# Patient Record
Sex: Female | Born: 1960 | Race: Black or African American | Hispanic: No | Marital: Single | Smoking: Never smoker
Health system: Southern US, Community
[De-identification: ages and names within clinical notes are randomized; demographics above are authoritative.]

## PROBLEM LIST (undated history)

## (undated) DIAGNOSIS — E785 Hyperlipidemia, unspecified: Secondary | ICD-10-CM

## (undated) DIAGNOSIS — Z8679 Personal history of other diseases of the circulatory system: Secondary | ICD-10-CM

## (undated) DIAGNOSIS — H269 Unspecified cataract: Secondary | ICD-10-CM

## (undated) DIAGNOSIS — E7800 Pure hypercholesterolemia, unspecified: Secondary | ICD-10-CM

## (undated) DIAGNOSIS — M25559 Pain in unspecified hip: Secondary | ICD-10-CM

## (undated) DIAGNOSIS — Z9889 Other specified postprocedural states: Secondary | ICD-10-CM

## (undated) HISTORY — DX: Unspecified cataract: H26.9

## (undated) HISTORY — DX: Hyperlipidemia, unspecified: E78.5

## (undated) HISTORY — DX: Personal history of other diseases of the circulatory system: Z86.79

## (undated) HISTORY — DX: Other specified postprocedural states: Z98.890

## (undated) HISTORY — PX: CHOLECYSTECTOMY: SHX55

## (undated) HISTORY — DX: Pain in unspecified hip: M25.559

## (undated) HISTORY — DX: Pure hypercholesterolemia, unspecified: E78.00

---

## 1999-03-20 ENCOUNTER — Other Ambulatory Visit: Admission: RE | Admit: 1999-03-20 | Discharge: 1999-03-20 | Payer: Self-pay | Admitting: Internal Medicine

## 2000-02-01 ENCOUNTER — Other Ambulatory Visit: Admission: RE | Admit: 2000-02-01 | Discharge: 2000-02-01 | Payer: Self-pay | Admitting: Internal Medicine

## 2000-03-06 ENCOUNTER — Encounter: Admission: RE | Admit: 2000-03-06 | Discharge: 2000-06-04 | Payer: Self-pay | Admitting: Internal Medicine

## 2007-05-25 DIAGNOSIS — Z9889 Other specified postprocedural states: Secondary | ICD-10-CM

## 2007-05-25 HISTORY — DX: Other specified postprocedural states: Z98.890

## 2009-08-20 ENCOUNTER — Emergency Department (HOSPITAL_COMMUNITY): Admission: EM | Admit: 2009-08-20 | Discharge: 2009-08-21 | Payer: Self-pay | Admitting: Emergency Medicine

## 2011-03-31 LAB — GLUCOSE, CAPILLARY: Glucose-Capillary: 167 mg/dL — ABNORMAL HIGH (ref 70–99)

## 2011-04-30 ENCOUNTER — Inpatient Hospital Stay (INDEPENDENT_AMBULATORY_CARE_PROVIDER_SITE_OTHER)
Admission: RE | Admit: 2011-04-30 | Discharge: 2011-04-30 | Disposition: A | Discharge status: Home / Self Care | Payer: Medicare Other | Source: Ambulatory Visit | Attending: Family Medicine | Admitting: Family Medicine

## 2011-04-30 DIAGNOSIS — K219 Gastro-esophageal reflux disease without esophagitis: Secondary | ICD-10-CM

## 2011-06-28 ENCOUNTER — Encounter: Payer: Self-pay | Admitting: Cardiology

## 2014-01-06 ENCOUNTER — Emergency Department (INDEPENDENT_AMBULATORY_CARE_PROVIDER_SITE_OTHER)
Admission: EM | Admit: 2014-01-06 | Discharge: 2014-01-06 | Disposition: A | Discharge status: Hospice | Payer: Medicare Other | Source: Home / Self Care

## 2014-01-06 ENCOUNTER — Emergency Department (HOSPITAL_COMMUNITY)
Admission: EM | Admit: 2014-01-06 | Discharge: 2014-01-06 | Disposition: A | Discharge status: Home / Self Care | Payer: Medicare Other | Attending: Emergency Medicine | Admitting: Emergency Medicine

## 2014-01-06 ENCOUNTER — Encounter (HOSPITAL_COMMUNITY): Payer: Self-pay | Admitting: Emergency Medicine

## 2014-01-06 ENCOUNTER — Emergency Department (HOSPITAL_COMMUNITY): Payer: Medicare Other

## 2014-01-06 DIAGNOSIS — R079 Chest pain, unspecified: Secondary | ICD-10-CM

## 2014-01-06 DIAGNOSIS — R072 Precordial pain: Secondary | ICD-10-CM | POA: Insufficient documentation

## 2014-01-06 DIAGNOSIS — R0609 Other forms of dyspnea: Secondary | ICD-10-CM

## 2014-01-06 DIAGNOSIS — R0602 Shortness of breath: Secondary | ICD-10-CM | POA: Insufficient documentation

## 2014-01-06 DIAGNOSIS — E119 Type 2 diabetes mellitus without complications: Secondary | ICD-10-CM

## 2014-01-06 DIAGNOSIS — Z7982 Long term (current) use of aspirin: Secondary | ICD-10-CM | POA: Insufficient documentation

## 2014-01-06 DIAGNOSIS — R0989 Other specified symptoms and signs involving the circulatory and respiratory systems: Secondary | ICD-10-CM

## 2014-01-06 DIAGNOSIS — R42 Dizziness and giddiness: Secondary | ICD-10-CM | POA: Insufficient documentation

## 2014-01-06 DIAGNOSIS — R55 Syncope and collapse: Secondary | ICD-10-CM

## 2014-01-06 DIAGNOSIS — Z8679 Personal history of other diseases of the circulatory system: Secondary | ICD-10-CM | POA: Insufficient documentation

## 2014-01-06 DIAGNOSIS — Z9889 Other specified postprocedural states: Secondary | ICD-10-CM | POA: Insufficient documentation

## 2014-01-06 DIAGNOSIS — R002 Palpitations: Secondary | ICD-10-CM

## 2014-01-06 DIAGNOSIS — R0600 Dyspnea, unspecified: Secondary | ICD-10-CM

## 2014-01-06 DIAGNOSIS — I10 Essential (primary) hypertension: Secondary | ICD-10-CM

## 2014-01-06 LAB — CBC
HCT: 36.9 % (ref 36.0–46.0)
Hemoglobin: 13.1 g/dL (ref 12.0–15.0)
MCH: 29.8 pg (ref 26.0–34.0)
MCHC: 35.5 g/dL (ref 30.0–36.0)
MCV: 84.1 fL (ref 78.0–100.0)
PLATELETS: 249 10*3/uL (ref 150–400)
RBC: 4.39 MIL/uL (ref 3.87–5.11)
RDW: 13.2 % (ref 11.5–15.5)
WBC: 6.2 10*3/uL (ref 4.0–10.5)

## 2014-01-06 LAB — BASIC METABOLIC PANEL
BUN: 6 mg/dL (ref 6–23)
CALCIUM: 9.1 mg/dL (ref 8.4–10.5)
CO2: 26 meq/L (ref 19–32)
CREATININE: 0.71 mg/dL (ref 0.50–1.10)
Chloride: 101 mEq/L (ref 96–112)
GFR calc Af Amer: >90 mL/min (ref >90)
Glucose, Bld: 99 mg/dL (ref 70–99)
Potassium: 3.7 mEq/L (ref 3.7–5.3)
Sodium: 139 mEq/L (ref 137–147)

## 2014-01-06 LAB — TROPONIN I

## 2014-01-06 LAB — GLUCOSE, CAPILLARY: Glucose-Capillary: 90 mg/dL (ref 70–99)

## 2014-01-06 NOTE — Discharge Instructions (Signed)
Chest Pain (Nonspecific) °It is often hard to give a specific diagnosis for the cause of chest pain. There is always a chance that your pain could be related to something serious, such as a heart attack or a blood clot in the lungs. You need to follow up with your caregiver for further evaluation. °CAUSES  °· Heartburn. °· Pneumonia or bronchitis. °· Anxiety or stress. °· Inflammation around your heart (pericarditis) or lung (pleuritis or pleurisy). °· A blood clot in the lung. °· A collapsed lung (pneumothorax). It can develop suddenly on its own (spontaneous pneumothorax) or from injury (trauma) to the chest. °· Shingles infection (herpes zoster virus). °The chest wall is composed of bones, muscles, and cartilage. Any of these can be the source of the pain. °· The bones can be bruised by injury. °· The muscles or cartilage can be strained by coughing or overwork. °· The cartilage can be affected by inflammation and become sore (costochondritis). °DIAGNOSIS  °Lab tests or other studies, such as X-rays, electrocardiography, stress testing, or cardiac imaging, may be needed to find the cause of your pain.  °TREATMENT  °· Treatment depends on what may be causing your chest pain. Treatment may include: °· Acid blockers for heartburn. °· Anti-inflammatory medicine. °· Pain medicine for inflammatory conditions. °· Antibiotics if an infection is present. °· You may be advised to change lifestyle habits. This includes stopping smoking and avoiding alcohol, caffeine, and chocolate. °· You may be advised to keep your head raised (elevated) when sleeping. This reduces the chance of acid going backward from your stomach into your esophagus. °· Most of the time, nonspecific chest pain will improve within 2 to 3 days with rest and mild pain medicine. °HOME CARE INSTRUCTIONS  °· If antibiotics were prescribed, take your antibiotics as directed. Finish them even if you start to feel better. °· For the next few days, avoid physical  activities that bring on chest pain. Continue physical activities as directed. °· Do not smoke. °· Avoid drinking alcohol. °· Only take over-the-counter or prescription medicine for pain, discomfort, or fever as directed by your caregiver. °· Follow your caregiver's suggestions for further testing if your chest pain does not go away. °· Keep any follow-up appointments you made. If you do not go to an appointment, you could develop lasting (chronic) problems with pain. If there is any problem keeping an appointment, you must call to reschedule. °SEEK MEDICAL CARE IF:  °· You think you are having problems from the medicine you are taking. Read your medicine instructions carefully. °· Your chest pain does not go away, even after treatment. °· You develop a rash with blisters on your chest. °SEEK IMMEDIATE MEDICAL CARE IF:  °· You have increased chest pain or pain that spreads to your arm, neck, jaw, back, or abdomen. °· You develop shortness of breath, an increasing cough, or you are coughing up blood. °· You have severe back or abdominal pain, feel nauseous, or vomit. °· You develop severe weakness, fainting, or chills. °· You have a fever. °THIS IS AN EMERGENCY. Do not wait to see if the pain will go away. Get medical help at once. Call your local emergency services (911 in [place] Do not drive yourself to the hospital. °MAKE SURE YOU:  °· Understand these instructions. °· Will watch your condition. °· Will get help right away if you are not doing well or get worse. °Document Released: 09/19/2005 Document Revised: 03/03/2012 Document Reviewed: 07/15/2008 °Exit[employer]® Patient Information ©2014 [employer]   [employer]. ° °

## 2014-01-06 NOTE — ED Notes (Signed)
Per pt sts chest pain with movement that started yesterday. sts the pain is intermittent and more with movement. sts that 2 weeks ago she had a virus and some vomiting but that is better now. Denies cough, fever, SOB.

## 2014-01-06 NOTE — ED Provider Notes (Signed)
CSN: [id number]     Arrival date & time 01/06/14  1045 History   First MD Initiated Contact with Patient 01/06/14 1121     Chief Complaint  Patient presents with  . Abdominal Pain   (Consider location/radiation/quality/duration/timing/severity/associated sxs/prior Treatment) HPI Comments: 53 year old female has a history of hypertension and type 2 diabetes mellitus but has not seen a physician or taken any medications for over 10 years. Yesterday she was in an exercise class which utilizes weights and treadmills and during the exercise she developed increased shortness of breath and a feeling that she was going to faint. This was associated with palpitations and rapid heart rate. This had never happened to her before while she been exercising. She had to stop and rest however the sensation of rapid heartbeat persisted through the remainder of the day and earlier today. She continues to rub her hand across her anterior chest and below the breast and across the upper abdomen as the source of discomfort but she is unable to describe it. She has no known history of heart disease or lung disease. It is written on her PMH that her history includes "personal history of unspecified circulatory disease". She underwent a cardiac catheterization in June of 2008: There is old the comments section reads "abnormal Cardiolite. LVH...questionable prior studies and question septal hypertrophy...had not seen echocardiogram. He had 65%. She is stable nail she does complain of mildly increased heart rate however her apical rate is 80.   Past Medical History  Diagnosis Date  . Other and unspecified hyperlipidemia   . Hip pain   . Personal history of unspecified circulatory disease   . Diabetes mellitus   . High cholesterol   . S/P cardiac cath June 2008    abnormal cardiolite. LVH..question on prior studies and question septal hypertroph...not seen echo... EF 65%   History reviewed. No pertinent past surgical  history. Family History  Problem Relation Age of Onset  . Stroke Other   . Diabetes Other   . Arthritis Other    History  Substance Use Topics  . Smoking status: Never Smoker   . Smokeless tobacco: Not on file  . Alcohol Use: No   OB History   Grav Para Term Preterm Abortions TAB SAB Ect Mult Living                 Review of Systems  Constitutional: Positive for diaphoresis, activity change and fatigue. Negative for fever.  HENT: Negative.   Respiratory: Positive for shortness of breath. Negative for cough and chest tightness.   Cardiovascular: Positive for palpitations.  Gastrointestinal: Negative.   Genitourinary: Negative.   Musculoskeletal: Negative.   Neurological: Positive for dizziness and light-headedness.    Allergies  Review of patient's allergies indicates no known allergies.  Home Medications   Current Outpatient Rx  Name  Route  Sig  Dispense  Refill  . aspirin 325 MG tablet   Oral   Take 325 mg by mouth daily.           . Calcium Carbonate-Vitamin D (CALCIUM-CARB 600 + D) 600-125 MG-UNIT TABS   Oral   Take by mouth daily.           . Cholecalciferol (D 5000) 5000 UNITS TABS   Oral   Take by mouth daily.           . Cinnamon 500 MG capsule   Oral   Take 500 mg by mouth daily. Take 2 caps          .  glipiZIDE (GLUCOTROL) 5 MG tablet   Oral   Take 5 mg by mouth 2 (two) times daily.           Marland Kitchen. lisinopril (PRINIVIL,ZESTRIL) 20 MG tablet   Oral   Take 20 mg by mouth daily.           . metFORMIN (GLUCOPHAGE) 500 MG tablet   Oral   Take 500 mg by mouth 2 (two) times daily.           . Omega-3 Fatty Acids (FISH OIL) 1000 MG CAPS   Oral   Take by mouth 2 (two) times daily.           . simvastatin (ZOCOR) 20 MG tablet   Oral   Take 20 mg by mouth at bedtime.           . vitamin C (ASCORBIC ACID) 500 MG tablet   Oral   Take 500 mg by mouth daily.            BP 139/74  Pulse 86  Temp(Src) 97.9 F (36.6 C) (Oral)   Resp 18  SpO2 100% Physical Exam  Nursing note and vitals reviewed. Constitutional: She is oriented to person, place, and time. She appears well-developed and well-nourished. No distress.  HENT:  Mouth/Throat: Oropharynx is clear and moist. No oropharyngeal exudate.  Eyes: Conjunctivae and EOM are normal.  Neck: Normal range of motion. Neck supple. No JVD present.  Cardiovascular: Normal rate, regular rhythm and normal heart sounds.   Pulmonary/Chest: Effort normal and breath sounds normal. No respiratory distress. She has no wheezes. She has no rales.  Abdominal: Soft. She exhibits no distension. There is no tenderness. There is no rebound.  Musculoskeletal: She exhibits no edema and no tenderness.  Lymphadenopathy:    She has no cervical adenopathy.  Neurological: She is alert and oriented to person, place, and time. She exhibits normal muscle tone.  Skin: Skin is warm and dry. No rash noted. No erythema.  Psychiatric: She has a normal mood and affect.    ED Course  Procedures (including critical care time) Labs Review Labs Reviewed - No data to display Imaging Review No results found.  EKG: ST depression with T-wave inversion in lead 3, less than 1/2 mm ST depression in aVF, inverted T waves in V2 with 1/2 mm ST elevation in V2. Normal sinus rhythm, no ectopy.   MDM   1. Near syncope   2. Heart palpitations   3. Dyspnea   4. HTN (hypertension)   5. T2DM (type 2 diabetes mellitus)      Due to the patient's history of untreated and uncontrolled hypertension and type 2 diabetes mellitus for the past 10 years, no local PCP and the events that occurred yesterday we will transfer to the emergency department via shuttle. She is stable at this time. She has no chest pain she is not short of breath. Her vitals are within normal limits.    Hayden Rasmussenavid Brenlynn Fake, NP 01/06/14 1227

## 2014-01-06 NOTE — ED Provider Notes (Signed)
Medical screening examination/treatment/procedure(s) were performed by non-physician practitioner and as supervising physician I was immediately available for consultation/collaboration.  Leslee Homeavid Cassia Fein, M.D.   Reuben Likesavid C Icela Glymph, MD 01/06/14 2053

## 2014-01-06 NOTE — ED Provider Notes (Signed)
I saw and evaluated the patient, reviewed the resident's note and I agree with the findings and plan. Patient is a 53 year old female who presents with complaints of chest discomfort that started the day before while in an exercise class. She denies shortness of breath, nausea, or diaphoresis. She has no prior cardiac history and has no risk factors.  On exam she is afebrile and vitals are stable. Heart is regular rate and rhythm and lungs are clear. Abdomen is benign. Extremities are without edema.   Workup reveals and negative EKG and negative troponin with almost 24 hours worth of symptoms. I doubt a cardiac etiology however we have made arrangements for her to followup with cardiology in the next one to 2 days. She is to return to the ER for symptoms worsen or change.  EKG Interpretation    Date/Time:  Wednesday January 06 2014 16:24:18 EST Ventricular Rate:  85 PR Interval:  185 QRS Duration: 104 QT Interval:  432 QTC Calculation: 514 R Axis:   30 Text Interpretation:  Sinus rhythm Probable left atrial enlargement Low voltage, precordial leads ST elevation, consider inferior injury Prolonged QT interval Confirmed by Malva CoganELOS  MD, Jonhatan Hearty ([id number]) on 01/06/2014 4:47:51 PM               Geoffery Lyonsouglas Moustapha Tooker, MD 01/06/14 2330

## 2014-01-06 NOTE — ED Provider Notes (Signed)
CSN: [id number]     Arrival date & time 01/06/14  1305 History   First MD Initiated Contact with Patient 01/06/14 1606     Chief Complaint  Patient presents with  . Chest Pain   (Consider location/radiation/quality/duration/timing/severity/associated sxs/prior Treatment) Patient is a 53 y.o. female presenting with chest pain. The history is provided by the patient and a relative. The history is limited by a developmental delay.  Chest Pain Pain location:  Substernal area Pain quality comment:  Shortness of breath, palpitations Pain radiates to:  Epigastrium Pain severity:  Mild Onset quality:  Sudden Progression:  Resolved Chronicity:  New Context comment:  Exercising Relieved by:  Rest Associated symptoms: dizziness   Associated symptoms: no abdominal pain, no back pain, no cough, no diaphoresis, no fever, no headache, no nausea, no numbness, no shortness of breath and not vomiting     Past Medical History  Diagnosis Date  . Other and unspecified hyperlipidemia   . Hip pain   . Personal history of unspecified circulatory disease   . Diabetes mellitus   . High cholesterol   . S/P cardiac cath June 2008    abnormal cardiolite. LVH..question on prior studies and question septal hypertroph...not seen echo... EF 65%   History reviewed. No pertinent past surgical history. Family History  Problem Relation Age of Onset  . Stroke Other   . Diabetes Other   . Arthritis Other    History  Substance Use Topics  . Smoking status: Never Smoker   . Smokeless tobacco: Not on file  . Alcohol Use: No   OB History   Grav Para Term Preterm Abortions TAB SAB Ect Mult Living                 Review of Systems  Constitutional: Negative for fever, chills and diaphoresis.  HENT: Negative for sore throat.   Eyes: Negative for pain.  Respiratory: Negative for cough and shortness of breath.   Cardiovascular: Positive for chest pain.  Gastrointestinal: Negative for nausea, vomiting,  abdominal pain and diarrhea.  Genitourinary: Negative for dysuria.  Musculoskeletal: Negative for back pain.  Skin: Negative for rash.  Neurological: Positive for dizziness. Negative for syncope (felt like she was going to pass out yesterday), numbness and headaches.    Allergies  Review of patient's allergies indicates no known allergies.  Home Medications   Current Outpatient Rx  Name  Route  Sig  Dispense  Refill  . aspirin 325 MG tablet   Oral   Take 325 mg by mouth daily.           . Multiple Vitamin (MULTIVITAMIN WITH MINERALS) TABS tablet   Oral   Take 1 tablet by mouth daily.         . Omega-3 Fatty Acids (FISH OIL) 1000 MG CAPS   Oral   Take 1 capsule by mouth every morning.          Marland Kitchen. tetrahydrozoline 0.05 % ophthalmic solution   Both Eyes   Place 2 drops into both eyes daily as needed (for dry eyes).          BP 122/84  Pulse 83  Temp(Src) 97.8 F (36.6 C) (Oral)  Resp 20  Ht 5\' 1"  (1.549 m)  Wt 168 lb (76.204 kg)  BMI 31.76 kg/m2  SpO2 98%  LMP 11/28/2013 Physical Exam  Constitutional: She is oriented to person, place, and time. She appears well-developed and well-nourished. No distress.  HENT:  Head: Normocephalic and atraumatic.  Eyes: Pupils are equal, round, and reactive to light. Right eye exhibits no discharge. Left eye exhibits no discharge.  Neck: Normal range of motion.  Cardiovascular: Normal rate, regular rhythm and normal heart sounds.   Pulmonary/Chest: Effort normal and breath sounds normal.  Abdominal: Soft. She exhibits no distension. There is no tenderness.  Musculoskeletal: Normal range of motion.  Neurological: She is alert and oriented to person, place, and time.  Skin: Skin is warm. She is not diaphoretic.    ED Course  Procedures (including critical care time) Labs Review Labs Reviewed  CBC  BASIC METABOLIC PANEL  TROPONIN I   Imaging Review Dg Chest 2 View  01/06/2014   CLINICAL DATA:  Centralized chest upper  abdominal pain for 2 days, history diabetes  EXAM: CHEST  2 VIEW  COMPARISON:  None  FINDINGS: Upper normal heart size.  Normal mediastinal contours and pulmonary vascularity.  Lungs clear.  No pleural effusion or pneumothorax.  Surgical clips right upper quadrant most consistent with prior cholecystectomy.  No acute osseous findings.  IMPRESSION: No acute abnormalities.   Electronically Signed   By: Ulyses Southward M.D.   On: 01/06/2014 14:27    EKG Interpretation    Date/Time:  Wednesday January 06 2014 16:24:18 EST Ventricular Rate:  85 PR Interval:  185 QRS Duration: 104 QT Interval:  432 QTC Calculation: 514 R Axis:   30 Text Interpretation:  Sinus rhythm Probable left atrial enlargement Low voltage, precordial leads ST elevation, consider inferior injury Prolonged QT interval Confirmed by Malva Cogan  MD, DOUGLAS ([id number]) on 01/06/2014 4:47:51 PM            MDM   1. Chest pain    53 yo F with hx of DM, HLD who presents with episode of shortness of breath, chest pain, faint feeling yesterday.   No current chest pain upon arrival. Patient and sister concerned for episode yesterday, which sounds like it was caused by exercise - diaphoresis, chest racing while doing treadmills/weights. Patient with risks for ACS, but no current chest pain. Patient with basic lab workup, with no acute concerning abnormalities.   Discussed with patient follow-up with cardiology / PCP. Patient and sister voice understanding. Patient has not followed-up with cardiology in the past after similar complaints. Called cardiology for unassigned patient, who agree to flag the patient for follow-up and contact her as an outpatient. Patient stable for discharge. Strict return precautions given. Discharged to home in stable condition. Patient seen and evaluated by myself and my attending, Dr. Judd Lien.      Imagene Sheller, MD 01/06/14 [phone]

## 2014-01-06 NOTE — ED Notes (Signed)
C/o upper and lower abd pain States she has pain underneath her breast and lower abd pain Does admit to working out  Admits to being a diabetic Has not had medications in a long time.

## 2014-01-20 ENCOUNTER — Ambulatory Visit: Payer: Medicare Other | Attending: Internal Medicine | Admitting: Internal Medicine

## 2014-01-20 ENCOUNTER — Encounter: Payer: Self-pay | Admitting: Internal Medicine

## 2014-01-20 VITALS — BP 152/91 | HR 81 | Temp 97.1°F | Resp 16 | Ht 61.0 in | Wt 168.0 lb

## 2014-01-20 DIAGNOSIS — E119 Type 2 diabetes mellitus without complications: Secondary | ICD-10-CM

## 2014-01-20 DIAGNOSIS — N644 Mastodynia: Secondary | ICD-10-CM

## 2014-01-20 DIAGNOSIS — R079 Chest pain, unspecified: Secondary | ICD-10-CM | POA: Insufficient documentation

## 2014-01-20 LAB — COMPLETE METABOLIC PANEL WITH GFR
ALBUMIN: 4.1 g/dL (ref 3.5–5.2)
ALT: 16 U/L (ref 0–35)
AST: 16 U/L (ref 0–37)
Alkaline Phosphatase: 49 U/L (ref 39–117)
BILIRUBIN TOTAL: 1.2 mg/dL (ref 0.2–1.2)
BUN: 6 mg/dL (ref 6–23)
CO2: 27 meq/L (ref 19–32)
Calcium: 8.9 mg/dL (ref 8.4–10.5)
Chloride: 102 mEq/L (ref 96–112)
Creat: 0.7 mg/dL (ref 0.50–1.10)
GLUCOSE: 112 mg/dL — AB (ref 70–99)
POTASSIUM: 3.6 meq/L (ref 3.5–5.3)
SODIUM: 135 meq/L (ref 135–145)
TOTAL PROTEIN: 6.8 g/dL (ref 6.0–8.3)

## 2014-01-20 LAB — GLUCOSE, POCT (MANUAL RESULT ENTRY): POC Glucose: 109 mg/dl — AB (ref 70–99)

## 2014-01-20 LAB — POCT GLYCOSYLATED HEMOGLOBIN (HGB A1C): Hemoglobin A1C: 6.7

## 2014-01-20 MED ORDER — PANTOPRAZOLE SODIUM 40 MG PO TBEC: 40.0000 mg | DELAYED_RELEASE_TABLET | Freq: Every day | ORAL | Status: DC

## 2014-01-20 MED ORDER — METFORMIN HCL 500 MG PO TABS: 500.0000 mg | ORAL_TABLET | Freq: Two times a day (BID) | ORAL | Status: DC

## 2014-01-20 NOTE — Progress Notes (Signed)
Patient ID: [name] Barker, female   DOB: [birth year], 53 y.o.   MRN: [id number]   CC:  HPI: 53 year old female with a history of hypertension, diabetes, recently seen in the ED on 01/06/14 for chest pain. The patient apparently was diaphoretic and tachycardic during exercise on the treadmill. She was symptom free by the time she reached the ED. She exercises 3 times a week. She does not report any consistent symptoms of chest pain with exercise. She claimed that she had a stress test in 2012 done by Dr. Myrtis Ser, she does not the results. She occasionally has heartburn during her menstrual cycle  She still gets her menstrual cycle monthly. She cannot recall when she had her last Pap smear or mammogram. She does not recall ever having a colonoscopy  Family history positive for colon cancer in the mother Father had heart attack at the age of 9  Social history nonsmoker nonalcoholic, retired since 2002  No Known Allergies Past Medical History  Diagnosis Date  . Other and unspecified hyperlipidemia   . Hip pain   . Personal history of unspecified circulatory disease   . Diabetes mellitus   . High cholesterol   . S/P cardiac cath June 2008    abnormal cardiolite. LVH..question on prior studies and question septal hypertroph...not seen echo... EF 65%   Current Outpatient Prescriptions on File Prior to Visit  Medication Sig Dispense Refill  . aspirin 325 MG tablet Take 325 mg by mouth daily.        . Multiple Vitamin (MULTIVITAMIN WITH MINERALS) TABS tablet Take 1 tablet by mouth daily.      . Omega-3 Fatty Acids (FISH OIL) 1000 MG CAPS Take 1 capsule by mouth every morning.       Marland Kitchen tetrahydrozoline 0.05 % ophthalmic solution Place 2 drops into both eyes daily as needed (for dry eyes).       No current facility-administered medications on file prior to visit.   Family History  Problem Relation Age of Onset  . Stroke Other   . Diabetes Other   . Arthritis Other    History   Social  History  . Marital Status: Single    Spouse Name: N/A    Number of Children: N/A  . Years of Education: N/A   Occupational History  . Not on file.   Social History Main Topics  . Smoking status: Never Smoker   . Smokeless tobacco: Not on file  . Alcohol Use: No  . Drug Use: No  . Sexual Activity:    Other Topics Concern  . 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     Review of Systems  Constitutional: Negative for fever, chills, diaphoresis, activity change, appetite change and fatigue.  HENT: Negative for ear pain, nosebleeds, congestion, facial swelling, rhinorrhea, neck pain, neck stiffness and ear discharge.   Eyes: Negative for pain, discharge, redness, itching and visual disturbance.  Respiratory: Negative for cough, choking, chest tightness, shortness of breath, wheezing and stridor.   Cardiovascular: Negative for chest pain, palpitations and leg swelling.  Gastrointestinal: Negative for abdominal distention.  Genitourinary: Negative for dysuria, urgency, frequency, hematuria, flank pain, decreased urine volume, difficulty urinating and dyspareunia.  Musculoskeletal: Negative for back pain, joint swelling, arthralgias and gait problem.  Neurological: Negative for dizziness, tremors, seizures, syncope, facial asymmetry, speech difficulty, weakness, light-headedness, numbness and headaches.  Hematological: Negative for adenopathy. Does not bruise/bleed easily.  Psychiatric/Behavioral: Negative for hallucinations, behavioral problems, confusion, dysphoric mood, decreased concentration and agitation.    Objective:   Filed Vitals:   01/20/14 1410  BP: 152/91  Pulse: 81  Temp: 97.1 F (36.2 C)  Resp: 16    Physical Exam  Constitutional: Appears well-developed and well-nourished. No distress.  HENT: Normocephalic. External right and left ear normal. Oropharynx is clear and moist.  Eyes: Conjunctivae and EOM  are normal. PERRLA, no scleral icterus.  Neck: Normal ROM. Neck supple. No JVD. No tracheal deviation. No thyromegaly.  CVS: RRR, S1/S2 +, no murmurs, no gallops, no carotid bruit.  Pulmonary: Effort and breath sounds normal, no stridor, rhonchi, wheezes, rales.  Abdominal: Soft. BS +,  no distension, tenderness, rebound or guarding.  Musculoskeletal: Normal range of motion. No edema and no tenderness.  Lymphadenopathy: No lymphadenopathy noted, cervical, inguinal. Neuro: Alert. Normal reflexes, muscle tone coordination. No cranial nerve deficit. Skin: Skin is warm and dry. No rash noted. Not diaphoretic. No erythema. No pallor.  Psychiatric: Normal mood and affect. Behavior, judgment, thought content normal.   Lab Results  Component Value Date   WBC 6.2 01/06/2014   HGB 13.1 01/06/2014   HCT 36.9 01/06/2014   MCV 84.1 01/06/2014   PLT 249 01/06/2014   Lab Results  Component Value Date   CREATININE 0.71 01/06/2014   BUN 6 01/06/2014   NA 139 01/06/2014   K 3.7 01/06/2014   CL 101 01/06/2014   CO2 26 01/06/2014    No results found for this basename: HGBA1C   Lipid Panel  No results found for this basename: chol, trig, hdl, cholhdl, vldl, ldlcalc       Assessment and plan:   There are no active problems to display for this patient.  Chest pain  Isolated episode, probably musculoskeletal Patient does not report any ongoing symptoms and she exercises 3 times a week We'll obtain a 2-D echo Troponin Also start the patient on a PPI If the echo negative no further workup is indicated   Establish care Refer the patient to gynecology for a Pap smear Gastroenterology for a colonoscopy Vitamin D., TSH, liver function Agreeable to receive immunization for tetanus and hepatitis B today       The patient was given clear instructions to go to ER or return to medical center if symptoms don't improve, worsen or new problems develop. The patient verbalized understanding. The patient was  told to call to get any lab results if not heard anything in the next week.

## 2014-01-20 NOTE — Progress Notes (Signed)
Pt is here to establish care. Pt is here to manage her diabetes and HTN.

## 2014-01-21 ENCOUNTER — Ambulatory Visit: Payer: Medicare Other

## 2014-01-21 LAB — TROPONIN I: Troponin I: 0.01 ng/mL (ref <0.06)

## 2014-01-21 LAB — TSH: TSH: 0.797 u[IU]/mL (ref 0.350–4.500)

## 2014-01-22 ENCOUNTER — Telehealth: Payer: Self-pay | Admitting: *Deleted

## 2014-01-22 NOTE — Telephone Encounter (Signed)
Message copied by Yuliana Vandrunen, [place] on Fri Jan 22, 2014 12:09 PM ------      Message from: Susie CassetteABROL MD, [facility]      Created: Thu Jan 21, 2014  9:20 AM       Notify patient of labs are normal, A1c is elevated at 6.7, patient is in the diabetic range, metformin has been prescribed ------

## 2014-01-22 NOTE — Telephone Encounter (Signed)
Left a voicemail for pt to give us a call back. 

## 2014-01-28 ENCOUNTER — Ambulatory Visit (HOSPITAL_COMMUNITY)
Admission: RE | Admit: 2014-01-28 | Discharge: 2014-01-28 | Disposition: A | Discharge status: Home / Self Care | Payer: Medicare Other | Source: Ambulatory Visit | Attending: Internal Medicine | Admitting: Internal Medicine

## 2014-01-28 DIAGNOSIS — I10 Essential (primary) hypertension: Secondary | ICD-10-CM | POA: Insufficient documentation

## 2014-01-28 DIAGNOSIS — I369 Nonrheumatic tricuspid valve disorder, unspecified: Secondary | ICD-10-CM

## 2014-01-28 DIAGNOSIS — R079 Chest pain, unspecified: Secondary | ICD-10-CM | POA: Insufficient documentation

## 2014-01-28 DIAGNOSIS — E119 Type 2 diabetes mellitus without complications: Secondary | ICD-10-CM

## 2014-01-28 DIAGNOSIS — I079 Rheumatic tricuspid valve disease, unspecified: Secondary | ICD-10-CM | POA: Insufficient documentation

## 2014-01-28 NOTE — Progress Notes (Signed)
  Echocardiogram 2D Echocardiogram has been performed.  Melvia Matousek 01/28/2014, 11:50 AM

## 2014-01-29 ENCOUNTER — Encounter: Payer: Self-pay | Admitting: Gastroenterology

## 2014-02-01 ENCOUNTER — Telehealth: Payer: Self-pay | Admitting: *Deleted

## 2014-02-01 NOTE — Telephone Encounter (Signed)
Message copied by Azarias Chiou, [place] R on Mon Feb 01, 2014  2:03 PM ------      Message from: Susie CassetteABROL MD, [facility]      Created: Fri Jan 29, 2014  9:52 AM       Normal 2-D echo ------

## 2014-02-04 ENCOUNTER — Ambulatory Visit
Admission: RE | Admit: 2014-02-04 | Discharge: 2014-02-04 | Disposition: A | Discharge status: Home / Self Care | Payer: Self-pay | Source: Ambulatory Visit | Attending: Internal Medicine | Admitting: Internal Medicine

## 2014-02-04 DIAGNOSIS — N644 Mastodynia: Secondary | ICD-10-CM

## 2014-02-04 DIAGNOSIS — E119 Type 2 diabetes mellitus without complications: Secondary | ICD-10-CM

## 2014-02-08 ENCOUNTER — Telehealth: Payer: Self-pay | Admitting: *Deleted

## 2014-02-08 NOTE — Telephone Encounter (Signed)
Message copied by Kaylean Tupou, [place] R on Mon Feb 08, 2014 11:32 AM ------      Message from: Susie CassetteABROL MD, Germain OsgoodNAYANA      Created: Fri Feb 05, 2014  2:08 PM       Negative mammogram, notify patient ------

## 2014-02-08 NOTE — Telephone Encounter (Signed)
Notified patient of results as followed. 

## 2014-03-09 ENCOUNTER — Ambulatory Visit (AMBULATORY_SURGERY_CENTER): Payer: Self-pay

## 2014-03-09 VITALS — Ht 61.0 in | Wt 167.2 lb

## 2014-03-09 DIAGNOSIS — Z1211 Encounter for screening for malignant neoplasm of colon: Secondary | ICD-10-CM

## 2014-03-09 MED ORDER — METOCLOPRAMIDE HCL 10 MG PO TABS: 10.0000 mg | ORAL_TABLET | Freq: Two times a day (BID) | ORAL | Status: DC

## 2014-03-23 ENCOUNTER — Encounter: Payer: Self-pay | Admitting: Gastroenterology

## 2014-03-23 ENCOUNTER — Ambulatory Visit (AMBULATORY_SURGERY_CENTER): Payer: Medicare Other | Admitting: Gastroenterology

## 2014-03-23 VITALS — BP 143/91 | HR 85 | Temp 98.0°F | Resp 22 | Ht 61.0 in | Wt 167.0 lb

## 2014-03-23 DIAGNOSIS — Z1211 Encounter for screening for malignant neoplasm of colon: Secondary | ICD-10-CM

## 2014-03-23 LAB — GLUCOSE, CAPILLARY
Glucose-Capillary: 76 mg/dL (ref 70–99)
Glucose-Capillary: 87 mg/dL (ref 70–99)
Glucose-Capillary: 105 mg/dL — ABNORMAL HIGH (ref 70–99)

## 2014-03-23 NOTE — Progress Notes (Addendum)
Spoke with Dr. Arlyce DiceKaplan regarding patient's 75 CBG.  She is not symptomatic.  Patient states that she doesn't currently have a doctor.  She isn't on meds due to the lack of insurance.   She said that it's been "over 4 years" since she had seen a doctor.  She states that she is "working on getting a new one."  Dr. Arlyce DiceKaplan said to give her D5W at a KVO rate for stabilization.  V/O Dr. Lewie Loronobert Kaplan/Suzanne Hodges, RN

## 2014-03-23 NOTE — Progress Notes (Signed)
Report to pacu rn, vss, bbs=clear 

## 2014-03-23 NOTE — Patient Instructions (Addendum)
YOU HAD AN ENDOSCOPIC PROCEDURE TODAY AT THE [facility]: Refer to the procedure report that was given to you for any specific questions about what was found during the examination.  If the procedure report does not answer your questions, please call your gastroenterologist to clarify.  If you requested that your care partner not be given the details of your procedure findings, then the procedure report has been included in a sealed envelope for you to review at your convenience later.  YOU SHOULD EXPECT: Some feelings of bloating in the abdomen. Passage of more gas than usual.  Walking can help get rid of the air that was put into your GI tract during the procedure and reduce the bloating. If you had a lower endoscopy (such as a colonoscopy or flexible sigmoidoscopy) you may notice spotting of blood in your stool or on the toilet paper. If you underwent a bowel prep for your procedure, then you may not have a normal bowel movement for a few days.  DIET: Your first meal following the procedure should be a light meal and then it is ok to progress to your normal diet.  A half-sandwich or bowl of soup is an example of a good first meal.  Heavy or fried foods are harder to digest and may make you feel nauseous or bloated.  Likewise meals heavy in dairy and vegetables can cause extra gas to form and this can also increase the bloating.  Drink plenty of fluids but you should avoid alcoholic beverages for 24 hours.  ACTIVITY: Your care partner should take you home directly after the procedure.  You should plan to take it easy, moving slowly for the rest of the day.  You can resume normal activity the day after the procedure however you should NOT DRIVE or use heavy machinery for 24 hours (because of the sedation medicines used during the test).    SYMPTOMS TO REPORT IMMEDIATELY: A gastroenterologist can be reached at any hour.  During normal business hours, 8:30 AM to 5:00 PM Monday through Friday,  call [phone].  After hours and on weekends, please call the GI answering service at [phone] who will take a message and have the physician on call contact you.   Following lower endoscopy (colonoscopy or flexible sigmoidoscopy):  Excessive amounts of blood in the stool  Significant tenderness or worsening of abdominal pains  Swelling of the abdomen that is new, acute  Fever of 100F or higher   FOLLOW UP: If any biopsies were taken you will be contacted by phone or by letter within the next 1-3 weeks.  Call your gastroenterologist if you have not heard about the biopsies in 3 weeks.  Our staff will call the home number listed on your records the next business day following your procedure to check on you and address any questions or concerns that you may have at that time regarding the information given to you following your procedure. This is a courtesy call and so if there is no answer at the home number and we have not heard from you through the emergency physician on call, we will assume that you have returned to your regular daily activities without incident.  SIGNATURES/CONFIDENTIALITY: You and/or your care partner have signed paperwork which will be entered into your electronic medical record.  These signatures attest to the fact that that the information above on your After Visit Summary has been reviewed and is understood.  Full responsibility of the confidentiality of   this discharge information lies with you and/or your care-partner.   You colon was normal today.  Dr. Arlyce DiceKaplan recommends to repeat next colonoscopy in 10 years. You may resume your current medications today. Please call if any questions or concerns. Blood sugar was 105 in the recovery room.

## 2014-03-23 NOTE — Progress Notes (Signed)
No complaints noted in the recovery room. Maw   

## 2014-03-23 NOTE — Op Note (Signed)
[facility] [address] [place], [id number]   COLONOSCOPY PROCEDURE REPORT  PATIENT: Barker Barker  MR#: [id number] BIRTHDATE: Oct 18, 1961 , 52  yrs. old GENDER: Female ENDOSCOPIST: Louis Meckelobert D Kaplan, MD REFERRED BY: PROCEDURE DATE:  03/23/2014 PROCEDURE:   Colonoscopy, diagnostic First Screening Colonoscopy - Avg.  risk and is 50 yrs.  old or older Yes.  Prior Negative Screening - Now for repeat screening. N/A  History of Adenoma - Now for follow-up colonoscopy & has been > or = to 3 yrs.  N/A  Polyps Removed Today? No.  Recommend repeat exam, <10 yrs? No. ASA CLASS:   Class II INDICATIONS:average risk screening. MEDICATIONS: MAC sedation, administered by CRNA and Propofol (Diprivan) 230 mg IV  DESCRIPTION OF PROCEDURE:   After the risks benefits and alternatives of the procedure were thoroughly explained, informed consent was obtained.  A digital rectal exam revealed no abnormalities of the rectum.   The LB [id number]  endoscope was introduced through the anus and advanced to the cecum, which was identified by both the appendix and ileocecal valve. No adverse events experienced.   The quality of the prep was Suprep fair  The instrument was then slowly withdrawn as the colon was fully examined.      COLON FINDINGS: A normal appearing cecum, ileocecal valve, and appendiceal orifice were identified.  The ascending, hepatic flexure, transverse, splenic flexure, descending, sigmoid colon and rectum appeared unremarkable.  No polyps or cancers were seen. Retroflexed views revealed no abnormalities. The time to cecum=2 minutes 02 seconds.  Withdrawal time=9 minutes 51 seconds.  The scope was withdrawn and the procedure completed. COMPLICATIONS: There were no complications.  ENDOSCOPIC IMPRESSION: Normal colon  RECOMMENDATIONS: Continue current colorectal screening recommendations for "routine risk" patients with a repeat colonoscopy in 10  years.   eSigned:  Louis Meckelobert D Kaplan, MD 03/23/2014 12:20 PM   cc: [name] LewandowskyJegede, [place] MD

## 2014-03-24 ENCOUNTER — Telehealth: Payer: Self-pay | Admitting: *Deleted

## 2014-03-24 NOTE — Telephone Encounter (Signed)
  Follow up Call-  Call back number 03/23/2014  Post procedure Call Back phone  # [phone]  Permission to leave phone message Yes     Patient questions:  Do you have a fever, pain , or abdominal swelling? no Pain Score  0 *  Have you tolerated food without any problems? yes  Have you been able to return to your normal activities? yes  Do you have any questions about your discharge instructions: Diet   no Medications  no Follow up visit  no  Do you have questions or concerns about your Care? no  Actions: * If pain score is 4 or above: No action needed, pain <4.

## 2014-04-20 ENCOUNTER — Ambulatory Visit: Payer: Medicare Other

## 2014-04-22 ENCOUNTER — Ambulatory Visit: Payer: Medicare Other | Attending: Family Medicine | Admitting: Family Medicine

## 2014-04-22 ENCOUNTER — Encounter: Payer: Self-pay | Admitting: Family Medicine

## 2014-04-22 VITALS — BP 142/88 | HR 82 | Temp 98.0°F | Ht 61.0 in | Wt 169.0 lb

## 2014-04-22 DIAGNOSIS — I10 Essential (primary) hypertension: Secondary | ICD-10-CM

## 2014-04-22 DIAGNOSIS — E119 Type 2 diabetes mellitus without complications: Secondary | ICD-10-CM

## 2014-04-22 LAB — LIPID PANEL
CHOL/HDL RATIO: 3.7 ratio
Cholesterol: 191 mg/dL (ref 0–200)
HDL: 52 mg/dL (ref >39)
LDL Cholesterol: 125 mg/dL — ABNORMAL HIGH (ref 0–99)
TRIGLYCERIDES: 70 mg/dL (ref <150)
VLDL: 14 mg/dL (ref 0–40)

## 2014-04-22 LAB — POCT GLYCOSYLATED HEMOGLOBIN (HGB A1C): Hemoglobin A1C: 6.3

## 2014-04-22 MED ORDER — LISINOPRIL 10 MG PO TABS: 10.0000 mg | ORAL_TABLET | Freq: Every day | ORAL | Status: DC

## 2014-04-22 NOTE — Patient Instructions (Signed)
Thank you for coming in today.  You are doing great with your diabetes!!!   We will do some blood work to check your cholesterol.  I will start you on a low dose of a medication called Lisinopril for hypertension that you will take 1 time a day.  Try over the counter Melatonin, you can get this from any drug store.    Contact us:  You have any lip or face swelling, breathing issues, or lightheadedness.    Come back in 3 months to follow up. Sooner if needed

## 2014-04-22 NOTE — Progress Notes (Signed)
   Subjective:    Patient ID: [name] Barker, female    DOB: 02-01-1961, 53 y.o.   MRN: [id number]  HPI HYPERTENSION Home BP readings: does not take Chest Pain: no Lightheadedness or Syncope: no Leg Swelling: no Never taken blood pressure medications   DIABETES Home BS readings: does not take Hypoglycemia < 60:   Foot problems: no Vision problems: no Diet controlled - exercising regularly with a class  Foot exam completes  Medications When took last medication:  This morning Misses taking medications:  no  Diet Ability to limit unhealthy foods:  yes  Exercise Frequency: yes Type: treadmill bicycle  Monitoring Labs and Parameters Last A1C:  Lab Results  Component Value Date   HGBA1C 6.7 01/20/2014    Last Lipid:  No results found for this basename: chol, hdl, ldl, ldldirect    Last Bmet  Potassium  Date Value Ref Range Status  01/20/2014 3.6  3.5 - 5.3 mEq/L Final     Sodium  Date Value Ref Range Status  01/20/2014 135  135 - 145 mEq/L Final     Creat  Date Value Ref Range Status  01/20/2014 0.70  0.50 - 1.10 mg/dL Final     Creatinine, Ser  Date Value Ref Range Status  01/06/2014 0.71  0.50 - 1.10 mg/dL Final      Last BPs:  BP Readings from Last 3 Encounters:  04/22/14 142/88  03/23/14 143/91  01/20/14 152/91    Weight history:  Wt Readings from Last 3 Encounters:  04/22/14 169 lb (76.658 kg)  03/23/14 167 lb (75.751 kg)  03/09/14 167 lb 3.2 oz (75.841 kg)      Review of Symptoms - see HPI PMH - Smoking status noted.       Review of Systems   Objective:   Physical Exam No acute distress   Lungs:  Normal respiratory effort, chest expands symmetrically. Lungs are clear to auscultation, no crackles or wheezes. Heart - Regular rate and rhythm.  No murmurs, gallops or rubs.    Extremities:  No cyanosis, edema, or deformity noted with good range of motion of all major joints.        Assessment & Plan:

## 2014-04-22 NOTE — Assessment & Plan Note (Signed)
Well controlled with diet. 

## 2014-04-22 NOTE — Assessment & Plan Note (Signed)
Not at goal given she has diabetes.  Will start lisinopril and follow up.  Recent bmet normal

## 2014-04-23 ENCOUNTER — Other Ambulatory Visit: Payer: Self-pay | Admitting: Family Medicine

## 2014-04-23 ENCOUNTER — Telehealth: Payer: Self-pay | Admitting: *Deleted

## 2014-04-23 MED ORDER — SIMVASTATIN 20 MG PO TABS: 20.0000 mg | ORAL_TABLET | Freq: Every day | ORAL | Status: DC

## 2014-04-23 NOTE — Telephone Encounter (Signed)
Message copied by Raynelle CharyWINFREE, Polina Burmaster R on Fri Apr 23, 2014  2:23 PM ------      Message from: Carney LivingHAMBLISS, MARSHALL L      Created: Fri Apr 23, 2014  7:39 AM       Please call her and let her know her cholesterol is to high.  I Will send in simvastatin 20 mg to take daily and come back in 3 months to check her cholesterol ------

## 2014-04-23 NOTE — Telephone Encounter (Signed)
I spoke to the pt and she stated that she was aware of her results and she was unable to get the medication right away due to finances.

## 2014-07-09 ENCOUNTER — Other Ambulatory Visit: Payer: Self-pay | Admitting: Internal Medicine

## 2014-07-22 ENCOUNTER — Encounter: Payer: Self-pay | Admitting: Internal Medicine

## 2014-07-22 ENCOUNTER — Ambulatory Visit: Payer: Medicare Other | Attending: Internal Medicine | Admitting: Internal Medicine

## 2014-07-22 VITALS — BP 124/77 | HR 63 | Temp 98.8°F | Resp 16

## 2014-07-22 DIAGNOSIS — E7800 Pure hypercholesterolemia, unspecified: Secondary | ICD-10-CM | POA: Diagnosis not present

## 2014-07-22 DIAGNOSIS — E785 Hyperlipidemia, unspecified: Secondary | ICD-10-CM | POA: Insufficient documentation

## 2014-07-22 DIAGNOSIS — K219 Gastro-esophageal reflux disease without esophagitis: Secondary | ICD-10-CM | POA: Insufficient documentation

## 2014-07-22 DIAGNOSIS — I10 Essential (primary) hypertension: Secondary | ICD-10-CM | POA: Insufficient documentation

## 2014-07-22 DIAGNOSIS — Z7982 Long term (current) use of aspirin: Secondary | ICD-10-CM | POA: Diagnosis not present

## 2014-07-22 DIAGNOSIS — E139 Other specified diabetes mellitus without complications: Secondary | ICD-10-CM

## 2014-07-22 DIAGNOSIS — E1169 Type 2 diabetes mellitus with other specified complication: Secondary | ICD-10-CM | POA: Insufficient documentation

## 2014-07-22 DIAGNOSIS — E089 Diabetes mellitus due to underlying condition without complications: Secondary | ICD-10-CM

## 2014-07-22 LAB — COMPLETE METABOLIC PANEL WITH GFR
ALT: 15 U/L (ref 0–35)
AST: 18 U/L (ref 0–37)
Albumin: 3.9 g/dL (ref 3.5–5.2)
Alkaline Phosphatase: 47 U/L (ref 39–117)
BUN: 7 mg/dL (ref 6–23)
CALCIUM: 9 mg/dL (ref 8.4–10.5)
CHLORIDE: 106 meq/L (ref 96–112)
CO2: 28 mEq/L (ref 19–32)
Creat: 0.83 mg/dL (ref 0.50–1.10)
GFR, EST NON AFRICAN AMERICAN: 81 mL/min
GFR, Est African American: >89 mL/min
Glucose, Bld: 93 mg/dL (ref 70–99)
Potassium: 4 mEq/L (ref 3.5–5.3)
Sodium: 141 mEq/L (ref 135–145)
Total Bilirubin: 1.1 mg/dL (ref 0.2–1.2)
Total Protein: 6.6 g/dL (ref 6.0–8.3)

## 2014-07-22 LAB — POCT GLYCOSYLATED HEMOGLOBIN (HGB A1C): Hemoglobin A1C: 6.3

## 2014-07-22 LAB — LIPID PANEL
Cholesterol: 112 mg/dL (ref 0–200)
HDL: 46 mg/dL (ref >39)
LDL Cholesterol: 53 mg/dL (ref 0–99)
Total CHOL/HDL Ratio: 2.4 Ratio
Triglycerides: 63 mg/dL (ref <150)
VLDL: 13 mg/dL (ref 0–40)

## 2014-07-22 LAB — GLUCOSE, POCT (MANUAL RESULT ENTRY): POC Glucose: 99 mg/dl (ref 70–99)

## 2014-07-22 NOTE — Patient Instructions (Signed)
diDiabetes Mellitus and Food It is important for you to manage your blood sugar (glucose) level. Your blood glucose level can be greatly affected by what you eat. Eating healthier foods in the appropriate amounts throughout the day at about the same time each day will help you control your blood glucose level. It can also help slow or prevent worsening of your diabetes mellitus. Healthy eating may even help you improve the level of your blood pressure and reach or maintain a healthy weight.  HOW CAN FOOD AFFECT ME? Carbohydrates Carbohydrates affect your blood glucose level more than any other type of food. Your dietitian will help you determine how many carbohydrates to eat at each meal and teach you how to count carbohydrates. Counting carbohydrates is important to keep your blood glucose at a healthy level, especially if you are using insulin or taking certain medicines for diabetes mellitus. Alcohol Alcohol can cause sudden decreases in blood glucose (hypoglycemia), especially if you use insulin or take certain medicines for diabetes mellitus. Hypoglycemia can be a life-threatening condition. Symptoms of hypoglycemia (sleepiness, dizziness, and disorientation) are similar to symptoms of having too much alcohol.  If your health care provider has given you approval to drink alcohol, do so in moderation and use the following guidelines:  Women should not have more than one drink per day, and men should not have more than two drinks per day. One drink is equal to:  12 oz of beer.  5 oz of wine.  1 oz of hard liquor.  Do not drink on an empty stomach.  Keep yourself hydrated. Have water, diet soda, or unsweetened iced tea.  Regular soda, juice, and other mixers might contain a lot of carbohydrates and should be counted. WHAT FOODS ARE NOT RECOMMENDED? As you make food choices, it is important to remember that all foods are not the same. Some foods have fewer nutrients per serving than other  foods, even though they might have the same number of calories or carbohydrates. It is difficult to get your body what it needs when you eat foods with fewer nutrients. Examples of foods that you should avoid that are high in calories and carbohydrates but low in nutrients include:  Trans fats (most processed foods list trans fats on the Nutrition Facts label).  Regular soda.  Juice.  Candy.  Sweets, such as cake, pie, doughnuts, and cookies.  Fried foods. WHAT FOODS CAN I EAT? Have nutrient-rich foods, which will nourish your body and keep you healthy. The food you should eat also will depend on several factors, including:  The calories you need.  The medicines you take.  Your weight.  Your blood glucose level.  Your blood pressure level.  Your cholesterol level. You also should eat a variety of foods, including:  Protein, such as meat, poultry, fish, tofu, nuts, and seeds (lean animal proteins are best).  Fruits.  Vegetables.  Dairy products, such as milk, cheese, and yogurt (low fat is best).  Breads, grains, pasta, cereal, rice, and beans.  Fats such as olive oil, trans fat-free margarine, canola oil, avocado, and olives. DOES EVERYONE WITH DIABETES MELLITUS HAVE THE SAME MEAL PLAN? Because every person with diabetes mellitus is different, there is not one meal plan that works for everyone. It is very important that you meet with a dietitian who will help you create a meal plan that is just right for you. Document Released: 09/06/2005 Document Revised: 12/15/2013 Document Reviewed: 11/06/2013 [facility] Patient Information 2015 [employer] [place]. This  information is not intended to replace advice given to you by your health care provider. Make sure you discuss any questions you have with your health care provider. DASH Eating Plan DASH stands for "Dietary Approaches to Stop Hypertension." The DASH eating plan is a healthy eating plan that has been shown to reduce high  blood pressure (hypertension). Additional health benefits may include reducing the risk of type 2 diabetes mellitus, heart disease, and stroke. The DASH eating plan may also help with weight loss. WHAT DO I NEED TO KNOW ABOUT THE DASH EATING PLAN? For the DASH eating plan, you will follow these general guidelines:  Choose foods with a percent daily value for sodium of less than 5% (as listed on the food label).  Use salt-free seasonings or herbs instead of table salt or sea salt.  Check with your health care provider or pharmacist before using salt substitutes.  Eat lower-sodium products, often labeled as "lower sodium" or "no salt added."  Eat fresh foods.  Eat more vegetables, fruits, and low-fat dairy products.  Choose whole grains. Look for the word "whole" as the first word in the ingredient list.  Choose fish and skinless chicken or turkey more often than red meat. Limit fish, poultry, and meat to 6 oz (170 g) each day.  Limit sweets, desserts, sugars, and sugary drinks.  Choose heart-healthy fats.  Limit cheese to 1 oz (28 g) per day.  Eat more home-cooked food and less restaurant, buffet, and fast food.  Limit fried foods.  Cook foods using methods other than frying.  Limit canned vegetables. If you do use them, rinse them well to decrease the sodium.  When eating at a restaurant, ask that your food be prepared with less salt, or no salt if possible. WHAT FOODS CAN I EAT? Seek help from a dietitian for individual calorie needs. Grains Whole grain or whole wheat bread. Brown rice. Whole grain or whole wheat pasta. Quinoa, bulgur, and whole grain cereals. Low-sodium cereals. Corn or whole wheat flour tortillas. Whole grain cornbread. Whole grain crackers. Low-sodium crackers. Vegetables Fresh or frozen vegetables (raw, steamed, roasted, or grilled). Low-sodium or reduced-sodium tomato and vegetable juices. Low-sodium or reduced-sodium tomato sauce and paste. Low-sodium  or reduced-sodium canned vegetables.  Fruits All fresh, canned (in natural juice), or frozen fruits. Meat and Other Protein Products Ground beef (85% or leaner), grass-fed beef, or beef trimmed of fat. Skinless chicken or turkey. Ground chicken or turkey. Pork trimmed of fat. All fish and seafood. Eggs. Dried beans, peas, or lentils. Unsalted nuts and seeds. Unsalted canned beans. Dairy Low-fat dairy products, such as skim or 1% milk, 2% or reduced-fat cheeses, low-fat ricotta or cottage cheese, or plain low-fat yogurt. Low-sodium or reduced-sodium cheeses. Fats and Oils Tub margarines without trans fats. Light or reduced-fat mayonnaise and salad dressings (reduced sodium). Avocado. Safflower, olive, or canola oils. Natural peanut or almond butter. Other Unsalted popcorn and pretzels. The items listed above may not be a complete list of recommended foods or beverages. Contact your dietitian for more options. WHAT FOODS ARE NOT RECOMMENDED? Grains White bread. White pasta. White rice. Refined cornbread. Bagels and croissants. Crackers that contain trans fat. Vegetables Creamed or fried vegetables. Vegetables in a cheese sauce. Regular canned vegetables. Regular canned tomato sauce and paste. Regular tomato and vegetable juices. Fruits Dried fruits. Canned fruit in light or heavy syrup. Fruit juice. Meat and Other Protein Products Fatty cuts of meat. Ribs, chicken wings, bacon, sausage, bologna, salami, chitterlings, fatback, hot   dogs, bratwurst, and packaged luncheon meats. Salted nuts and seeds. Canned beans with salt. Dairy Whole or 2% milk, cream, half-and-half, and cream cheese. Whole-fat or sweetened yogurt. Full-fat cheeses or blue cheese. Nondairy creamers and whipped toppings. Processed cheese, cheese spreads, or cheese curds. Condiments Onion and garlic salt, seasoned salt, table salt, and sea salt. Canned and packaged gravies. Worcestershire sauce. Tartar sauce. Barbecue sauce.  Teriyaki sauce. Soy sauce, including reduced sodium. Steak sauce. Fish sauce. Oyster sauce. Cocktail sauce. Horseradish. Ketchup and mustard. Meat flavorings and tenderizers. Bouillon cubes. Hot sauce. Tabasco sauce. Marinades. Taco seasonings. Relishes. Fats and Oils Butter, stick margarine, lard, shortening, ghee, and bacon fat. Coconut, palm kernel, or palm oils. Regular salad dressings. Other Pickles and olives. Salted popcorn and pretzels. The items listed above may not be a complete list of foods and beverages to avoid. Contact your dietitian for more information. WHERE CAN I FIND MORE INFORMATION? National Heart, Lung, and Blood Institute: www.nhlbi.nih.gov/health/health-topics/topics/dash/ Document Released: 11/29/2011 Document Revised: 04/26/2014 Document Reviewed: 10/14/2013 [employer] Patient Information 2015 [employer]. This information is not intended to replace advice given to you by your health care provider. Make sure you discuss any questions you have with your health care provider.  

## 2014-07-22 NOTE — Progress Notes (Signed)
Patient here for follow up on her diabetes 

## 2014-07-22 NOTE — Progress Notes (Signed)
MRN: [id number] Name: [name] Barker  Sex: female Age: 53 y.o. DOB: 1961/11/01  Allergies: Review of patient's allergies indicates no known allergies.  Chief Complaint  Patient presents with  . Follow-up    dm    HPI: Patient is 53 y.o. female who has history of diabetes, hypertension, hyperlipidemia comes today for followup, she denies any acute symptoms , denies any headache dizziness chest and shortness of breath, she is up-to-date with mammogram and colonoscopy  Past Medical History  Diagnosis Date  . Other and unspecified hyperlipidemia   . Hip pain   . Personal history of unspecified circulatory disease   . Diabetes mellitus   . High cholesterol   . S/P cardiac cath June 2008    abnormal cardiolite. LVH..question on prior studies and question septal hypertroph...not seen echo... EF 65%    Past Surgical History  Procedure Laterality Date  . Cholecystectomy        Medication List       This list is accurate as of: 07/22/14  9:26 AM.  Always use your most recent med list.               aspirin 325 MG tablet  Take 325 mg by mouth daily.     Fish Oil 1000 MG Caps  Take 1 capsule by mouth every morning.     lisinopril 10 MG tablet  Commonly known as:  PRINIVIL,ZESTRIL  Take 1 tablet (10 mg total) by mouth daily.     metoCLOPramide 10 MG tablet  Commonly known as:  REGLAN  Take 1 tablet (10 mg total) by mouth 2 (two) times daily.     multivitamin with minerals Tabs tablet  Take 1 tablet by mouth daily.     pantoprazole 40 MG tablet  Commonly known as:  PROTONIX  Take 1 tablet (40 mg total) by mouth daily.     simvastatin 20 MG tablet  Commonly known as:  ZOCOR  Take 1 tablet (20 mg total) by mouth at bedtime.     tetrahydrozoline 0.05 % ophthalmic solution  Place 2 drops into both eyes daily as needed (for dry eyes).        No orders of the defined types were placed in this encounter.    There is no immunization history for the selected  administration types on file for this patient.  Family History  Problem Relation Age of Onset  . Stroke Other   . Diabetes Other   . Arthritis Other   . Colon cancer Neg Hx     History  Substance Use Topics  . Smoking status: Never Smoker   . Smokeless tobacco: Never Used  . Alcohol Use: No    Review of Systems   As noted in HPI  Filed Vitals:   07/22/14 0857  BP: 124/77  Pulse: 63  Temp: 98.8 F (37.1 C)  Resp: 16    Physical Exam  Physical Exam  Constitutional: No distress.  Eyes: EOM are normal. Pupils are equal, round, and reactive to light.  Cardiovascular: Normal rate and regular rhythm.   Pulmonary/Chest: Breath sounds normal. No respiratory distress. She has no wheezes. She has no rales.  Abdominal: Soft. There is no tenderness.  Musculoskeletal: She exhibits no edema.    CBC    Component Value Date/Time   WBC 6.2 01/06/2014 1415   RBC 4.39 01/06/2014 1415   HGB 13.1 01/06/2014 1415   HCT 36.9 01/06/2014 1415   PLT 249 01/06/2014 1415  MCV 84.1 01/06/2014 1415    CMP     Component Value Date/Time   NA 135 01/20/2014 1430   K 3.6 01/20/2014 1430   CL 102 01/20/2014 1430   CO2 27 01/20/2014 1430   GLUCOSE 112* 01/20/2014 1430   BUN 6 01/20/2014 1430   CREATININE 0.70 01/20/2014 1430   CREATININE 0.71 01/06/2014 1339   CALCIUM 8.9 01/20/2014 1430   PROT 6.8 01/20/2014 1430   ALBUMIN 4.1 01/20/2014 1430   AST 16 01/20/2014 1430   ALT 16 01/20/2014 1430   ALKPHOS 49 01/20/2014 1430   BILITOT 1.2 01/20/2014 1430   GFRNONAA >89 01/20/2014 1430   GFRNONAA >90 01/06/2014 1339   GFRAA >89 01/20/2014 1430   GFRAA >90 01/06/2014 1339    Lab Results  Component Value Date/Time   CHOL 191 04/22/2014  9:50 AM    No components found with this basename: hga1c    Lab Results  Component Value Date/Time   AST 16 01/20/2014  2:30 PM    Assessment and Plan  Diabetes mellitus due to underlying condition without complications - Plan:  Results for orders placed in  visit on 07/22/14  GLUCOSE, POCT (MANUAL RESULT ENTRY)      Result Value Ref Range   POC Glucose 99  70 - 99 mg/dl  POCT GLYCOSYLATED HEMOGLOBIN (HGB A1C)      Result Value Ref Range   Hemoglobin A1C 6.3%     Diabetes is diet controlled, will repeat her COMPLETE METABOLIC PANEL WITH GFR  Essential hypertension, benign - Plan: Patient is on lisinopril, repeat COMPLETE METABOLIC PANEL WITH GFR  Other and unspecified hyperlipidemia - Plan: Patient is on Zocor 20 mg, will repeat Lipid panel  Gastroesophageal reflux disease, esophagitis presence not specified Lifestyle modification and Protonix.  Health Maintenance -Colonoscopy: uptodate   -Mammogram:uptodate    Return in about 3 months (around 10/22/2014) for diabetes, hypertension, hyperipidemia.  Doris Cheadle, MD

## 2014-07-23 ENCOUNTER — Telehealth: Payer: Self-pay | Admitting: *Deleted

## 2014-07-23 NOTE — Telephone Encounter (Signed)
Pt is aware of her lab results.  

## 2014-07-23 NOTE — Telephone Encounter (Signed)
Message copied by Raynelle CharyWINFREE, Tyrann Donaho R on Fri Jul 23, 2014  9:18 AM ------      Message from: Doris CheadleADVANI, DEEPAK      Created: Fri Jul 23, 2014  9:16 AM       Call and let the Patient know that blood work is normal.; ------

## 2014-07-26 ENCOUNTER — Telehealth: Payer: Self-pay

## 2014-07-26 NOTE — Telephone Encounter (Signed)
Message left on machine that her blood work was all normal

## 2014-07-26 NOTE — Telephone Encounter (Signed)
Message copied by Lestine MountJUAREZ, Luxe Cuadros L on Mon Jul 26, 2014 12:32 PM ------      Message from: Doris CheadleADVANI, DEEPAK      Created: Fri Jul 23, 2014  9:16 AM       Call and let the Patient know that blood work is normal.; ------

## 2014-10-20 ENCOUNTER — Ambulatory Visit: Payer: Medicare Other | Attending: Internal Medicine | Admitting: Internal Medicine

## 2014-10-20 ENCOUNTER — Encounter: Payer: Self-pay | Admitting: Internal Medicine

## 2014-10-20 VITALS — BP 140/94 | HR 89 | Temp 98.7°F | Resp 16 | Wt 166.0 lb

## 2014-10-20 DIAGNOSIS — I10 Essential (primary) hypertension: Secondary | ICD-10-CM | POA: Diagnosis not present

## 2014-10-20 DIAGNOSIS — Z23 Encounter for immunization: Secondary | ICD-10-CM | POA: Insufficient documentation

## 2014-10-20 DIAGNOSIS — E119 Type 2 diabetes mellitus without complications: Secondary | ICD-10-CM | POA: Diagnosis not present

## 2014-10-20 DIAGNOSIS — Z823 Family history of stroke: Secondary | ICD-10-CM | POA: Insufficient documentation

## 2014-10-20 DIAGNOSIS — Z833 Family history of diabetes mellitus: Secondary | ICD-10-CM | POA: Insufficient documentation

## 2014-10-20 DIAGNOSIS — Z7982 Long term (current) use of aspirin: Secondary | ICD-10-CM | POA: Diagnosis not present

## 2014-10-20 DIAGNOSIS — E785 Hyperlipidemia, unspecified: Secondary | ICD-10-CM | POA: Insufficient documentation

## 2014-10-20 DIAGNOSIS — Z800 Family history of malignant neoplasm of digestive organs: Secondary | ICD-10-CM | POA: Insufficient documentation

## 2014-10-20 DIAGNOSIS — E139 Other specified diabetes mellitus without complications: Secondary | ICD-10-CM

## 2014-10-20 LAB — GLUCOSE, POCT (MANUAL RESULT ENTRY): POC Glucose: 110 mg/dl — AB (ref 70–99)

## 2014-10-20 LAB — POCT GLYCOSYLATED HEMOGLOBIN (HGB A1C): Hemoglobin A1C: 6.2

## 2014-10-20 MED ORDER — METFORMIN HCL 500 MG PO TABS: 500.0000 mg | ORAL_TABLET | Freq: Two times a day (BID) | ORAL | Status: DC

## 2014-10-20 MED ORDER — SIMVASTATIN 20 MG PO TABS
20.0000 mg | ORAL_TABLET | Freq: Every day | ORAL | Status: DC
Start: 2014-10-20 — End: 2015-05-05

## 2014-10-20 MED ORDER — LISINOPRIL 10 MG PO TABS: 10.0000 mg | ORAL_TABLET | Freq: Every day | ORAL | Status: DC

## 2014-10-20 NOTE — Progress Notes (Signed)
Patient states she is here for follow up on her diabetes and flu shot

## 2014-10-20 NOTE — Patient Instructions (Signed)
Diabetes Mellitus and Food It is important for you to manage your blood sugar (glucose) level. Your blood glucose level can be greatly affected by what you eat. Eating healthier foods in the appropriate amounts throughout the day at about the same time each day will help you control your blood glucose level. It can also help slow or prevent worsening of your diabetes mellitus. Healthy eating may even help you improve the level of your blood pressure and reach or maintain a healthy weight.  HOW CAN FOOD AFFECT ME? Carbohydrates Carbohydrates affect your blood glucose level more than any other type of food. Your dietitian will help you determine how many carbohydrates to eat at each meal and teach you how to count carbohydrates. Counting carbohydrates is important to keep your blood glucose at a healthy level, especially if you are using insulin or taking certain medicines for diabetes mellitus. Alcohol Alcohol can cause sudden decreases in blood glucose (hypoglycemia), especially if you use insulin or take certain medicines for diabetes mellitus. Hypoglycemia can be a life-threatening condition. Symptoms of hypoglycemia (sleepiness, dizziness, and disorientation) are similar to symptoms of having too much alcohol.  If your health care provider has given you approval to drink alcohol, do so in moderation and use the following guidelines:  Women should not have more than one drink per day, and men should not have more than two drinks per day. One drink is equal to:  12 oz of beer.  5 oz of wine.  1 oz of hard liquor.  Do not drink on an empty stomach.  Keep yourself hydrated. Have water, diet soda, or unsweetened iced tea.  Regular soda, juice, and other mixers might contain a lot of carbohydrates and should be counted. WHAT FOODS ARE NOT RECOMMENDED? As you make food choices, it is important to remember that all foods are not the same. Some foods have fewer nutrients per serving than other  foods, even though they might have the same number of calories or carbohydrates. It is difficult to get your body what it needs when you eat foods with fewer nutrients. Examples of foods that you should avoid that are high in calories and carbohydrates but low in nutrients include:  Trans fats (most processed foods list trans fats on the Nutrition Facts label).  Regular soda.  Juice.  Candy.  Sweets, such as cake, pie, doughnuts, and cookies.  Fried foods. WHAT FOODS CAN I EAT? Have nutrient-rich foods, which will nourish your body and keep you healthy. The food you should eat also will depend on several factors, including:  The calories you need.  The medicines you take.  Your weight.  Your blood glucose level.  Your blood pressure level.  Your cholesterol level. You also should eat a variety of foods, including:  Protein, such as meat, poultry, fish, tofu, nuts, and seeds (lean animal proteins are best).  Fruits.  Vegetables.  Dairy products, such as milk, cheese, and yogurt (low fat is best).  Breads, grains, pasta, cereal, rice, and beans.  Fats such as olive oil, trans fat-free margarine, canola oil, avocado, and olives. DOES EVERYONE WITH DIABETES MELLITUS HAVE THE SAME MEAL PLAN? Because every person with diabetes mellitus is different, there is not one meal plan that works for everyone. It is very important that you meet with a dietitian who will help you create a meal plan that is just right for you. Document Released: 09/06/2005 Document Revised: 12/15/2013 Document Reviewed: 11/06/2013 Exit[employer] Patient Information 2015 [employer]. This   information is not intended to replace advice given to you by your health care provider. Make sure you discuss any questions you have with your health care provider. DASH Eating Plan DASH stands for "Dietary Approaches to Stop Hypertension." The DASH eating plan is a healthy eating plan that has been shown to reduce high  blood pressure (hypertension). Additional health benefits may include reducing the risk of type 2 diabetes mellitus, heart disease, and stroke. The DASH eating plan may also help with weight loss. WHAT DO I NEED TO KNOW ABOUT THE DASH EATING PLAN? For the DASH eating plan, you will follow these general guidelines:  Choose foods with a percent daily value for sodium of less than 5% (as listed on the food label).  Use salt-free seasonings or herbs instead of table salt or sea salt.  Check with your health care provider or pharmacist before using salt substitutes.  Eat lower-sodium products, often labeled as "lower sodium" or "no salt added."  Eat fresh foods.  Eat more vegetables, fruits, and low-fat dairy products.  Choose whole grains. Look for the word "whole" as the first word in the ingredient list.  Choose fish and skinless chicken or turkey more often than red meat. Limit fish, poultry, and meat to 6 oz (170 g) each day.  Limit sweets, desserts, sugars, and sugary drinks.  Choose heart-healthy fats.  Limit cheese to 1 oz (28 g) per day.  Eat more home-cooked food and less restaurant, buffet, and fast food.  Limit fried foods.  Cook foods using methods other than frying.  Limit canned vegetables. If you do use them, rinse them well to decrease the sodium.  When eating at a restaurant, ask that your food be prepared with less salt, or no salt if possible. WHAT FOODS CAN I EAT? Seek help from a dietitian for individual calorie needs. Grains Whole grain or whole wheat bread. Brown rice. Whole grain or whole wheat pasta. Quinoa, bulgur, and whole grain cereals. Low-sodium cereals. Corn or whole wheat flour tortillas. Whole grain cornbread. Whole grain crackers. Low-sodium crackers. Vegetables Fresh or frozen vegetables (raw, steamed, roasted, or grilled). Low-sodium or reduced-sodium tomato and vegetable juices. Low-sodium or reduced-sodium tomato sauce and paste. Low-sodium  or reduced-sodium canned vegetables.  Fruits All fresh, canned (in natural juice), or frozen fruits. Meat and Other Protein Products Ground beef (85% or leaner), grass-fed beef, or beef trimmed of fat. Skinless chicken or turkey. Ground chicken or turkey. Pork trimmed of fat. All fish and seafood. Eggs. Dried beans, peas, or lentils. Unsalted nuts and seeds. Unsalted canned beans. Dairy Low-fat dairy products, such as skim or 1% milk, 2% or reduced-fat cheeses, low-fat ricotta or cottage cheese, or plain low-fat yogurt. Low-sodium or reduced-sodium cheeses. Fats and Oils Tub margarines without trans fats. Light or reduced-fat mayonnaise and salad dressings (reduced sodium). Avocado. Safflower, olive, or canola oils. Natural peanut or almond butter. Other Unsalted popcorn and pretzels. The items listed above may not be a complete list of recommended foods or beverages. Contact your dietitian for more options. WHAT FOODS ARE NOT RECOMMENDED? Grains White bread. White pasta. White rice. Refined cornbread. Bagels and croissants. Crackers that contain trans fat. Vegetables Creamed or fried vegetables. Vegetables in a cheese sauce. Regular canned vegetables. Regular canned tomato sauce and paste. Regular tomato and vegetable juices. Fruits Dried fruits. Canned fruit in light or heavy syrup. Fruit juice. Meat and Other Protein Products Fatty cuts of meat. Ribs, chicken wings, bacon, sausage, bologna, salami, chitterlings, fatback, hot   dogs, bratwurst, and packaged luncheon meats. Salted nuts and seeds. Canned beans with salt. Dairy Whole or 2% milk, cream, half-and-half, and cream cheese. Whole-fat or sweetened yogurt. Full-fat cheeses or blue cheese. Nondairy creamers and whipped toppings. Processed cheese, cheese spreads, or cheese curds. Condiments Onion and garlic salt, seasoned salt, table salt, and sea salt. Canned and packaged gravies. Worcestershire sauce. Tartar sauce. Barbecue sauce.  Teriyaki sauce. Soy sauce, including reduced sodium. Steak sauce. Fish sauce. Oyster sauce. Cocktail sauce. Horseradish. Ketchup and mustard. Meat flavorings and tenderizers. Bouillon cubes. Hot sauce. Tabasco sauce. Marinades. Taco seasonings. Relishes. Fats and Oils Butter, stick margarine, lard, shortening, ghee, and bacon fat. Coconut, palm kernel, or palm oils. Regular salad dressings. Other Pickles and olives. Salted popcorn and pretzels. The items listed above may not be a complete list of foods and beverages to avoid. Contact your dietitian for more information. WHERE CAN I FIND MORE INFORMATION? National Heart, [employer], and [employer] Institute: www.nhlbi.nih.gov/health/health-topics/topics/dash/ Document Released: 11/29/2011 Document Revised: 04/26/2014 Document Reviewed: 10/14/2013 ExitCare Patient Information 2015 [employer]. This information is not intended to replace advice given to you by your health care provider. Make sure you discuss any questions you have with your health care provider.  

## 2014-10-20 NOTE — Progress Notes (Signed)
MRN: [id number] Name: [name] Barker  Sex: female Age: 53 y.o. DOB: [birth year]  Allergies: Review of patient's allergies indicates no known allergies.  Chief Complaint  Patient presents with  . Follow-up    HPI: Patient is 53 y.o. female who has history of diabetes hypertension and lipidemia comes today for followup, she has been taking metformin as well as Zocor, as per patient she ran out of her blood pressure medication lisinopril which was taking 10 mg, today her blood pressure is borderline elevated, denies any headache dizziness chest and shortness of breath. Patient would like to have a flu shot today.  Past Medical History  Diagnosis Date  . Other and unspecified hyperlipidemia   . Hip pain   . Personal history of unspecified circulatory disease   . Diabetes mellitus   . High cholesterol   . S/P cardiac cath June 2008    abnormal cardiolite. LVH..question on prior studies and question septal hypertroph...not seen echo... EF 65%    Past Surgical History  Procedure Laterality Date  . Cholecystectomy        Medication List       This list is accurate as of: 10/20/14 11:15 AM.  Always use your most recent med list.               aspirin 325 MG tablet  Take 325 mg by mouth daily.     Fish Oil 1000 MG Caps  Take 1 capsule by mouth every morning.     lisinopril 10 MG tablet  Commonly known as:  PRINIVIL,ZESTRIL  Take 1 tablet (10 mg total) by mouth daily.     metFORMIN 500 MG tablet  Commonly known as:  GLUCOPHAGE  Take 1 tablet (500 mg total) by mouth 2 (two) times daily with a meal.     metoCLOPramide 10 MG tablet  Commonly known as:  REGLAN  Take 1 tablet (10 mg total) by mouth 2 (two) times daily.     multivitamin with minerals Tabs tablet  Take 1 tablet by mouth daily.     pantoprazole 40 MG tablet  Commonly known as:  PROTONIX  Take 1 tablet (40 mg total) by mouth daily.     simvastatin 20 MG tablet  Commonly known as:  ZOCOR  Take 1  tablet (20 mg total) by mouth at bedtime.     tetrahydrozoline 0.05 % ophthalmic solution  Place 2 drops into both eyes daily as needed (for dry eyes).        Meds ordered this encounter  Medications  . DISCONTD: metFORMIN (GLUCOPHAGE) 500 MG tablet    Sig: Take by mouth 2 (two) times daily with a meal.  . metFORMIN (GLUCOPHAGE) 500 MG tablet    Sig: Take 1 tablet (500 mg total) by mouth 2 (two) times daily with a meal.    Dispense:  60 tablet    Refill:  3  . lisinopril (PRINIVIL,ZESTRIL) 10 MG tablet    Sig: Take 1 tablet (10 mg total) by mouth daily.    Dispense:  90 tablet    Refill:  3  . simvastatin (ZOCOR) 20 MG tablet    Sig: Take 1 tablet (20 mg total) by mouth at bedtime.    Dispense:  90 tablet    Refill:  3    Immunization History  Administered Date(s) Administered  . Influenza,inj,Quad PF,36+ Mos 10/20/2014    Family History  Problem Relation Age of Onset  . Stroke Other   . Diabetes  Other   . Arthritis Other   . Colon cancer Neg Hx     History  Substance Use Topics  . Smoking status: Never Smoker   . Smokeless tobacco: Never Used  . Alcohol Use: No    Review of Systems   As noted in HPI  Filed Vitals:   10/20/14 1047  BP: 140/94  Pulse: 89  Temp: 98.7 F (37.1 C)  Resp: 16    Physical Exam  Physical Exam  Constitutional: No distress.  Eyes: EOM are normal. Pupils are equal, round, and reactive to light.  Cardiovascular: Normal rate and regular rhythm.   Pulmonary/Chest: Breath sounds normal. No respiratory distress. She has no wheezes. She has no rales.  Musculoskeletal: She exhibits no edema.    CBC    Component Value Date/Time   WBC 6.2 01/06/2014 1415   RBC 4.39 01/06/2014 1415   HGB 13.1 01/06/2014 1415   HCT 36.9 01/06/2014 1415   PLT 249 01/06/2014 1415   MCV 84.1 01/06/2014 1415    CMP     Component Value Date/Time   NA 141 07/22/2014 0927   K 4.0 07/22/2014 0927   CL 106 07/22/2014 0927   CO2 28 07/22/2014 0927    GLUCOSE 93 07/22/2014 0927   BUN 7 07/22/2014 0927   CREATININE 0.83 07/22/2014 0927   CREATININE 0.71 01/06/2014 1339   CALCIUM 9.0 07/22/2014 0927   PROT 6.6 07/22/2014 0927   ALBUMIN 3.9 07/22/2014 0927   AST 18 07/22/2014 0927   ALT 15 07/22/2014 0927   ALKPHOS 47 07/22/2014 0927   BILITOT 1.1 07/22/2014 0927   GFRNONAA 81 07/22/2014 0927   GFRNONAA >90 01/06/2014 1339   GFRAA >89 07/22/2014 0927   GFRAA >90 01/06/2014 1339    Lab Results  Component Value Date/Time   CHOL 112 07/22/2014  9:27 AM    No components found with this basename: hga1c    Lab Results  Component Value Date/Time   AST 18 07/22/2014  9:27 AM    Assessment and Plan  Other specified diabetes mellitus without complications - Plan:  Results for orders placed in visit on 10/20/14  GLUCOSE, POCT (MANUAL RESULT ENTRY)      Result Value Ref Range   POC Glucose 110 (*) 70 - 99 mg/dl  POCT GLYCOSYLATED HEMOGLOBIN (HGB A1C)      Result Value Ref Range   Hemoglobin A1C 6.2     Diabetes is well controlled, she'll continue with current meds Glucose (CBG), HgB A1c, metFORMIN (GLUCOPHAGE) 500 MG tablet, simvastatin (ZOCOR) 20 MG tablet  Essential hypertension, benign - Plan: resume back on lisinopril (PRINIVIL,ZESTRIL) 10 MG tablet will check blood chemistry on the following visit    Health Maintenance Flu shot given today   Return in about 3 months (around 01/20/2015) for hypertension, hyperipidemia, diabetes.  Doris CheadleADVANI, Aleynah Rocchio, MD

## 2015-01-10 ENCOUNTER — Encounter: Payer: Self-pay | Admitting: Internal Medicine

## 2015-01-13 ENCOUNTER — Ambulatory Visit: Payer: Medicare HMO | Attending: Internal Medicine | Admitting: Internal Medicine

## 2015-01-13 ENCOUNTER — Encounter: Payer: Self-pay | Admitting: Internal Medicine

## 2015-01-13 VITALS — BP 108/72 | HR 75 | Temp 98.0°F | Resp 16 | Wt 166.0 lb

## 2015-01-13 DIAGNOSIS — I10 Essential (primary) hypertension: Secondary | ICD-10-CM | POA: Insufficient documentation

## 2015-01-13 DIAGNOSIS — E785 Hyperlipidemia, unspecified: Secondary | ICD-10-CM | POA: Diagnosis not present

## 2015-01-13 DIAGNOSIS — E119 Type 2 diabetes mellitus without complications: Secondary | ICD-10-CM | POA: Insufficient documentation

## 2015-01-13 DIAGNOSIS — E139 Other specified diabetes mellitus without complications: Secondary | ICD-10-CM

## 2015-01-13 LAB — COMPLETE METABOLIC PANEL WITH GFR
ALBUMIN: 4.3 g/dL (ref 3.5–5.2)
ALT: 15 U/L (ref 0–35)
AST: 15 U/L (ref 0–37)
Alkaline Phosphatase: 57 U/L (ref 39–117)
BUN: 9 mg/dL (ref 6–23)
CO2: 27 mEq/L (ref 19–32)
Calcium: 9.4 mg/dL (ref 8.4–10.5)
Chloride: 104 mEq/L (ref 96–112)
Creat: 0.77 mg/dL (ref 0.50–1.10)
GFR, EST NON AFRICAN AMERICAN: 88 mL/min
GFR, Est African American: >89 mL/min
Glucose, Bld: 86 mg/dL (ref 70–99)
Potassium: 4.6 mEq/L (ref 3.5–5.3)
Sodium: 139 mEq/L (ref 135–145)
TOTAL PROTEIN: 6.9 g/dL (ref 6.0–8.3)
Total Bilirubin: 1.1 mg/dL (ref 0.2–1.2)

## 2015-01-13 LAB — LIPID PANEL
Cholesterol: 146 mg/dL (ref 0–200)
HDL: 58 mg/dL (ref >39)
LDL Cholesterol: 70 mg/dL (ref 0–99)
TRIGLYCERIDES: 89 mg/dL (ref <150)
Total CHOL/HDL Ratio: 2.5 Ratio
VLDL: 18 mg/dL (ref 0–40)

## 2015-01-13 LAB — GLUCOSE, POCT (MANUAL RESULT ENTRY): POC Glucose: 77 mg/dl (ref 70–99)

## 2015-01-13 LAB — POCT GLYCOSYLATED HEMOGLOBIN (HGB A1C): Hemoglobin A1C: 5.6

## 2015-01-13 MED ORDER — FREESTYLE SYSTEM KIT: 1.0000 | PACK | Status: DC | PRN

## 2015-01-13 NOTE — Progress Notes (Signed)
MRN: [id number] Name: [name] Barker  Sex: female Age: 54 y.o. DOB: Oct 13, 1961  Allergies: Review of patient's allergies indicates no known allergies.  Chief Complaint  Patient presents with  . Follow-up    HPI: Patient is 54 y.o. female who has to of diabetes hypertension hyperlipidemia comes today for followup her, her blood pressure is well controlled, she has been taking metformin 500 mg twice a day for diabetes, as per patient she does not check her blood sugar level at home and needs a glucometer, she does report occasional symptoms of hypoglycemia, today noticed her hemoglobin A1c has improved to 5.6%.  Past Medical History  Diagnosis Date  . Other and unspecified hyperlipidemia   . Hip pain   . Personal history of unspecified circulatory disease   . Diabetes mellitus   . High cholesterol   . S/P cardiac cath June 2008    abnormal cardiolite. LVH..question on prior studies and question septal hypertroph...not seen echo... EF 65%    Past Surgical History  Procedure Laterality Date  . Cholecystectomy        Medication List       This list is accurate as of: 01/13/15 10:35 AM.  Always use your most recent med list.               aspirin 325 MG tablet  Take 325 mg by mouth daily.     Fish Oil 1000 MG Caps  Take 1 capsule by mouth every morning.     glucose monitoring kit monitoring kit  1 each by Does not apply route as needed for other. 1 month Diabetic Testing Supplies for QAC-QHS accuchecks.     lisinopril 10 MG tablet  Commonly known as:  PRINIVIL,ZESTRIL  Take 1 tablet (10 mg total) by mouth daily.     metFORMIN 500 MG tablet  Commonly known as:  GLUCOPHAGE  Take 1 tablet (500 mg total) by mouth 2 (two) times daily with a meal.     metoCLOPramide 10 MG tablet  Commonly known as:  REGLAN  Take 1 tablet (10 mg total) by mouth 2 (two) times daily.     multivitamin with minerals Tabs tablet  Take 1 tablet by mouth daily.     pantoprazole 40 MG  tablet  Commonly known as:  PROTONIX  Take 1 tablet (40 mg total) by mouth daily.     simvastatin 20 MG tablet  Commonly known as:  ZOCOR  Take 1 tablet (20 mg total) by mouth at bedtime.     tetrahydrozoline 0.05 % ophthalmic solution  Place 2 drops into both eyes daily as needed (for dry eyes).        Meds ordered this encounter  Medications  . glucose monitoring kit (FREESTYLE) monitoring kit    Sig: 1 each by Does not apply route as needed for other. 1 month Diabetic Testing Supplies for QAC-QHS accuchecks.    Dispense:  1 each    Refill:  1    Immunization History  Administered Date(s) Administered  . Influenza,inj,Quad PF,36+ Mos 10/20/2014    Family History  Problem Relation Age of Onset  . Stroke Other   . Diabetes Other   . Arthritis Other   . Colon cancer Neg Hx     History  Substance Use Topics  . Smoking status: Never Smoker   . Smokeless tobacco: Never Used  . Alcohol Use: No    Review of Systems   As noted in HPI  Filed Vitals:  01/13/15 1014  BP: 108/72  Pulse: 75  Temp: 98 F (36.7 C)  Resp: 16    Physical Exam  Physical Exam  Constitutional: No distress.  Eyes: Pupils are equal, round, and reactive to light.  Cardiovascular: Normal rate.   Pulmonary/Chest: Breath sounds normal. No respiratory distress. She has no wheezes. She has no rales.    CBC    Component Value Date/Time   WBC 6.2 01/06/2014 1415   RBC 4.39 01/06/2014 1415   HGB 13.1 01/06/2014 1415   HCT 36.9 01/06/2014 1415   PLT 249 01/06/2014 1415   MCV 84.1 01/06/2014 1415    CMP     Component Value Date/Time   NA 141 07/22/2014 0927   K 4.0 07/22/2014 0927   CL 106 07/22/2014 0927   CO2 28 07/22/2014 0927   GLUCOSE 93 07/22/2014 0927   BUN 7 07/22/2014 0927   CREATININE 0.83 07/22/2014 0927   CREATININE 0.71 01/06/2014 1339   CALCIUM 9.0 07/22/2014 0927   PROT 6.6 07/22/2014 0927   ALBUMIN 3.9 07/22/2014 0927   AST 18 07/22/2014 0927   ALT 15  07/22/2014 0927   ALKPHOS 47 07/22/2014 0927   BILITOT 1.1 07/22/2014 0927   GFRNONAA 81 07/22/2014 0927   GFRNONAA >90 01/06/2014 1339   GFRAA >89 07/22/2014 0927   GFRAA >90 01/06/2014 1339    Lab Results  Component Value Date/Time   CHOL 112 07/22/2014 09:27 AM    No components found for: HGA1C  Lab Results  Component Value Date/Time   AST 18 07/22/2014 09:27 AM    Assessment and Plan  Other specified diabetes mellitus without complications - Plan: Results for orders placed or performed in visit on 01/13/15  Glucose (CBG)  Result Value Ref Range   POC Glucose 77.0 70 - 99 mg/dl  HgB A1c  Result Value Ref Range   Hemoglobin A1C 5.60    Diabetes is fairly well controlled, patient is given prescription for glucometer,patient is advised about hypoglycemic symptoms, advised patient to reduce the dose of metformin if her fasting sugar was dropping less than 80 milligram per deciliter , will repeat A1c in 3 months  Glucose (CBG), HgB A1c, glucose monitoring kit (FREESTYLE) monitoring kit  Essential hypertension, benign - Plan: Blood pressure is well controlled, continue with lisinopril 10 mg daily, will recheck COMPLETE METABOLIC PANEL WITH GFR  Hyperlipidemia - Plan:currently patient is on Zocor, will repeat her  Lipid panel   Health Maintenance -Colonoscopy: uptodate   -Mammogram: uptodate  -Vaccinations:  uptodate with flu shot   Return in about 3 months (around 04/14/2015) for hypertension, diabetes.  Lorayne Marek, MD

## 2015-01-13 NOTE — Progress Notes (Deleted)
Patient Demographics  [name] Barker, is a 54 y.o. female  SVX:[id number]  [id number]  DOB - Aug 24, 1961  CC:  Chief Complaint  Patient presents with  . Follow-up       HPI: [name] Barker is a 54 y.o. female here today to establish medical care. Patient has No headache, No chest pain, No abdominal pain - No Nausea, No new weakness tingling or numbness, No Cough - SOB.  No Known Allergies Past Medical History  Diagnosis Date  . Other and unspecified hyperlipidemia   . Hip pain   . Personal history of unspecified circulatory disease   . Diabetes mellitus   . High cholesterol   . S/P cardiac cath June 2008    abnormal cardiolite. LVH..question on prior studies and question septal hypertroph...not seen echo... EF 65%   Current Outpatient Prescriptions on File Prior to Visit  Medication Sig Dispense Refill  . aspirin 325 MG tablet Take 325 mg by mouth daily.      Marland Kitchen lisinopril (PRINIVIL,ZESTRIL) 10 MG tablet Take 1 tablet (10 mg total) by mouth daily. 90 tablet 3  . metFORMIN (GLUCOPHAGE) 500 MG tablet Take 1 tablet (500 mg total) by mouth 2 (two) times daily with a meal. 60 tablet 3  . metoCLOPramide (REGLAN) 10 MG tablet Take 1 tablet (10 mg total) by mouth 2 (two) times daily. 2 tablet 0  . Multiple Vitamin (MULTIVITAMIN WITH MINERALS) TABS tablet Take 1 tablet by mouth daily.    . Omega-3 Fatty Acids (FISH OIL) 1000 MG CAPS Take 1 capsule by mouth every morning.     . pantoprazole (PROTONIX) 40 MG tablet Take 1 tablet (40 mg total) by mouth daily. 30 tablet 3  . simvastatin (ZOCOR) 20 MG tablet Take 1 tablet (20 mg total) by mouth at bedtime. 90 tablet 3  . tetrahydrozoline 0.05 % ophthalmic solution Place 2 drops into both eyes daily as needed (for dry eyes).     No current facility-administered medications on file prior to visit.   Family History  Problem Relation Age of Onset  . Stroke Other   . Diabetes Other   . Arthritis Other   . Colon cancer Neg Hx      History   Social History  . Marital Status: Single    Spouse Name: N/A    Number of Children: N/A  . Years of Education: N/A   Occupational History  . Not on file.   Social History Main Topics  . Smoking status: Never Smoker   . Smokeless tobacco: Never Used  . Alcohol Use: No  . Drug Use: No  . Sexual Activity: Not on file   Other Topics Concern  . 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     Review of Systems: Constitutional: Negative for fever, chills, diaphoresis, activity change, appetite change and fatigue. HENT: Negative for ear pain, nosebleeds, congestion, facial swelling, rhinorrhea, neck pain, neck stiffness and ear discharge.  Eyes: Negative for pain, discharge, redness, itching and visual disturbance. Respiratory: Negative for cough, choking, chest tightness, shortness of breath, wheezing and stridor.  Cardiovascular: Negative for chest pain, palpitations and leg swelling. Gastrointestinal: Negative for abdominal distention. Genitourinary: Negative for dysuria, urgency, frequency, hematuria, flank pain, decreased urine volume, difficulty urinating and dyspareunia.  Musculoskeletal: Negative for back pain, joint swelling, arthralgia and gait problem. Neurological: Negative for dizziness, tremors, seizures, syncope, facial asymmetry, speech difficulty, weakness, light-headedness,  numbness and headaches.  Hematological: Negative for adenopathy. Does not bruise/bleed easily. Psychiatric/Behavioral: Negative for hallucinations, behavioral problems, confusion, dysphoric mood, decreased concentration and agitation.    Objective:   Filed Vitals:   01/13/15 1014  BP: 108/72  Pulse: 75  Temp: 98 F (36.7 C)  Resp: 16    Physical Exam: Constitutional: Patient appears well-developed and well-nourished. No distress. HENT: Normocephalic, atraumatic, External right and left ear normal. Oropharynx is clear  and moist.  Eyes: Conjunctivae and EOM are normal. PERRLA, no scleral icterus. Neck: Normal ROM. Neck supple. No JVD. No tracheal deviation. No thyromegaly. CVS: RRR, S1/S2 +, no murmurs, no gallops, no carotid bruit.  Pulmonary: Effort and breath sounds normal, no stridor, rhonchi, wheezes, rales.  Abdominal: Soft. BS +, no distension, tenderness, rebound or guarding.  Musculoskeletal: Normal range of motion. No edema and no tenderness.  Lymphadenopathy: No lymphadenopathy noted, cervical, inguinal or axillary Neuro: Alert. Normal reflexes, muscle tone coordination. No cranial nerve deficit. Skin: Skin is warm and dry. No rash noted. Not diaphoretic. No erythema. No pallor. Psychiatric: Normal mood and affect. Behavior, judgment, thought content normal.  Lab Results  Component Value Date   WBC 6.2 01/06/2014   HGB 13.1 01/06/2014   HCT 36.9 01/06/2014   MCV 84.1 01/06/2014   PLT 249 01/06/2014   Lab Results  Component Value Date   CREATININE 0.83 07/22/2014   BUN 7 07/22/2014   NA 141 07/22/2014   K 4.0 07/22/2014   CL 106 07/22/2014   CO2 28 07/22/2014    Lab Results  Component Value Date   HGBA1C 5.60 01/13/2015   Lipid Panel     Component Value Date/Time   CHOL 112 07/22/2014 0927   TRIG 63 07/22/2014 0927   HDL 46 07/22/2014 0927   CHOLHDL 2.4 07/22/2014 0927   VLDL 13 07/22/2014 0927   LDLCALC 53 07/22/2014 0927       Assessment and plan:   1. Other specified diabetes mellitus without complications *** - Glucose (CBG) - HgB A1c - glucose monitoring kit (FREESTYLE) monitoring kit; 1 each by Does not apply route as needed for other. 1 month Diabetic Testing Supplies for QAC-QHS accuchecks.  Dispense: 1 each; Refill: 1  2. Essential hypertension, benign *** - COMPLETE METABOLIC PANEL WITH GFR  3. Hyperlipidemia *** - Lipid panel        Health Maintenance -Colonoscopy: -Pap Smear: -Mammogram: -Vaccinations:  -TdAP  -PNA (PPSV23) (one dose after  65) (or one dose before 65 if chronic conditions)  -Zoster (1 dose after 60 yrs)  -Influenza  Return in about 3 months (around 04/14/2015) for hypertension, diabetes.    The patient was given clear instructions to go to ER or return to medical center if symptoms don't improve, worsen or new problems develop. The patient verbalized understanding. The patient was told to call to get lab results if they haven't heard anything in the next week.     Lorayne Marek, MD

## 2015-01-13 NOTE — Progress Notes (Signed)
Patient states here for follow up on her diabetes  Does not need any refills at this time

## 2015-01-18 ENCOUNTER — Telehealth: Payer: Self-pay | Admitting: *Deleted

## 2015-01-18 NOTE — Telephone Encounter (Signed)
-----   Message from Doris Cheadleeepak Advani, MD sent at 01/17/2015 10:25 AM EST ----- Call and let the Patient know that blood work is normal.

## 2015-01-18 NOTE — Telephone Encounter (Signed)
Pt aware of lab results 

## 2015-02-19 IMAGING — CR DG CHEST 2V
2 series · 2 of 2 positions shown · non-contrast
Comparison: None

CLINICAL DATA: Centralized chest upper abdominal pain for 2 days,
history diabetes

EXAM:
CHEST  2 VIEW

[w chest pa]
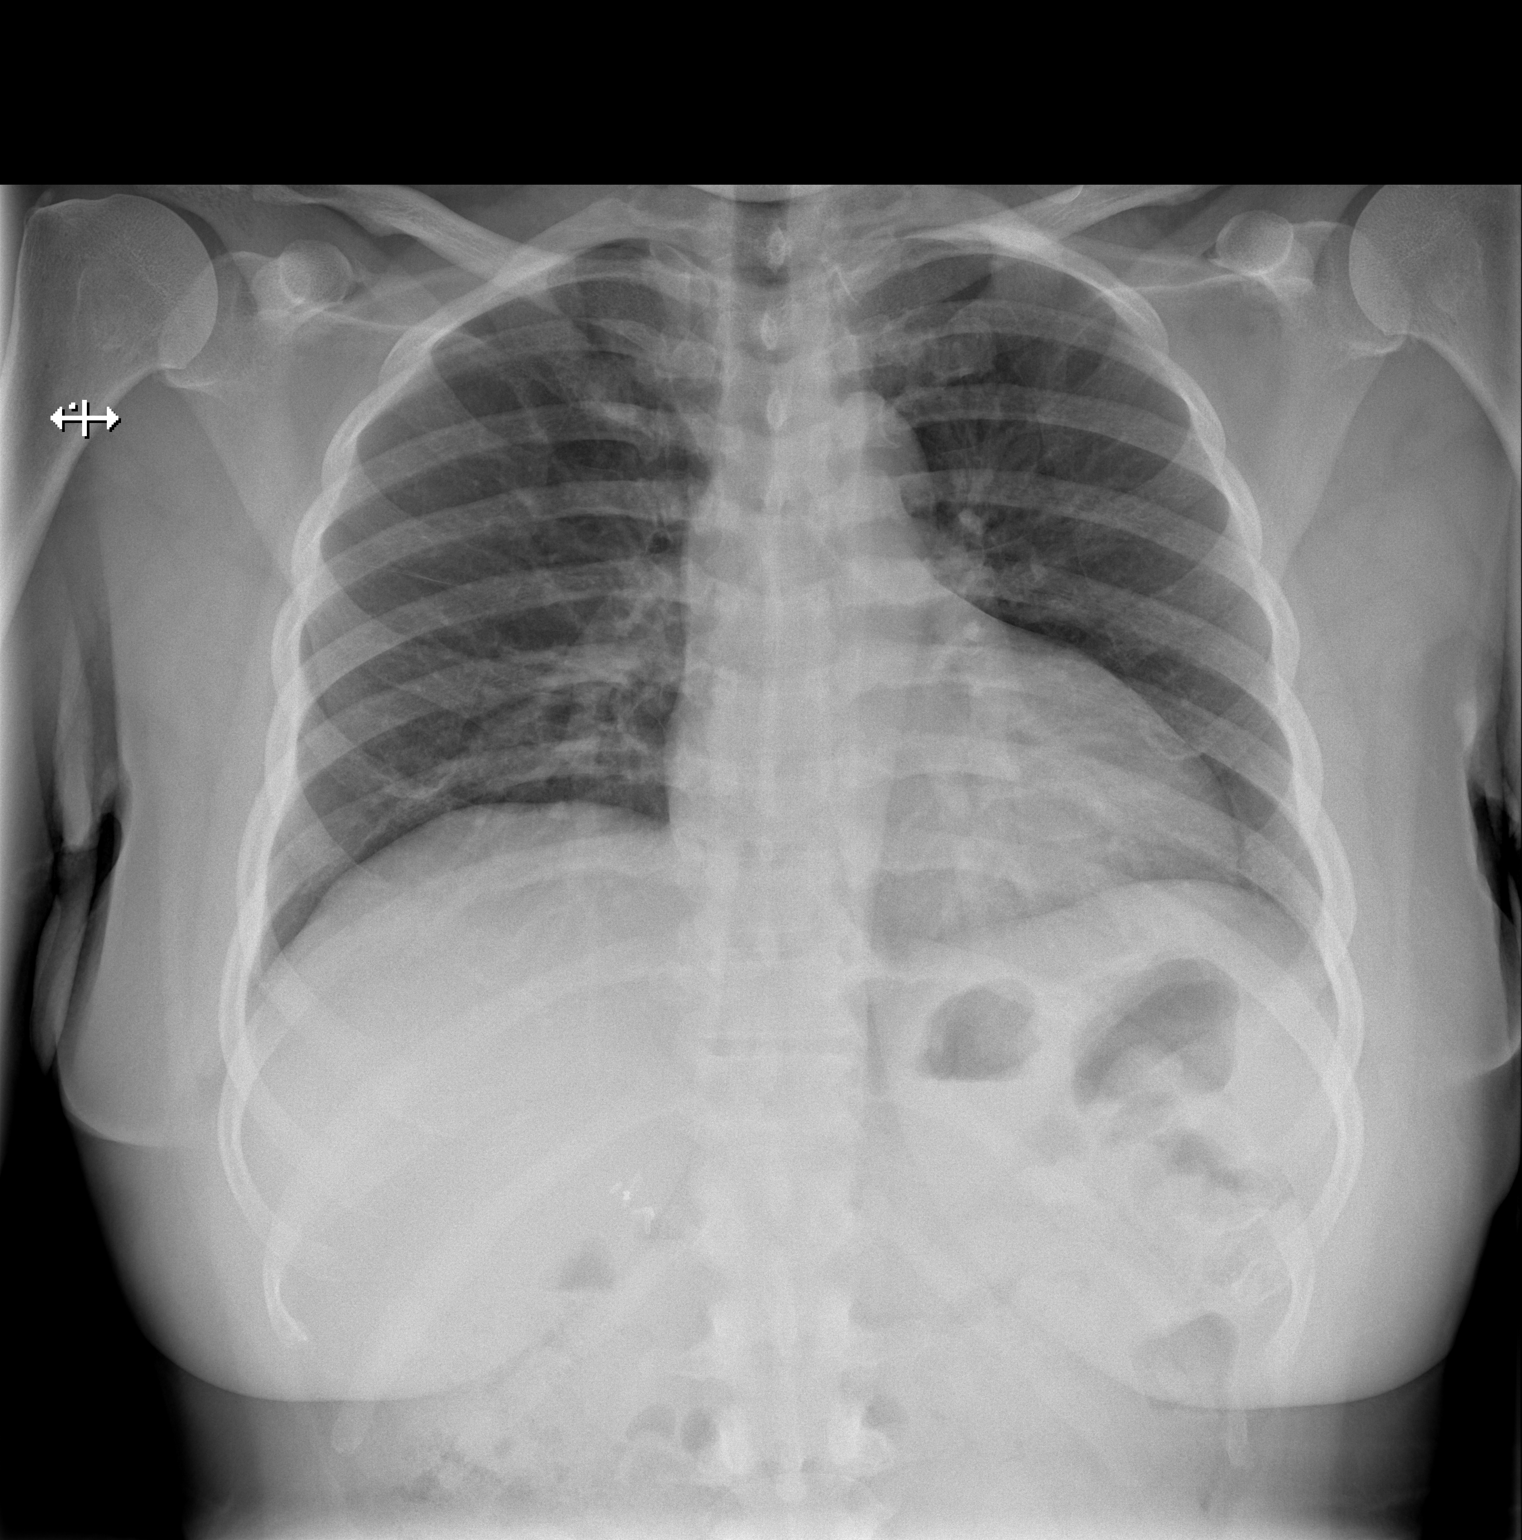

[w chest lat]
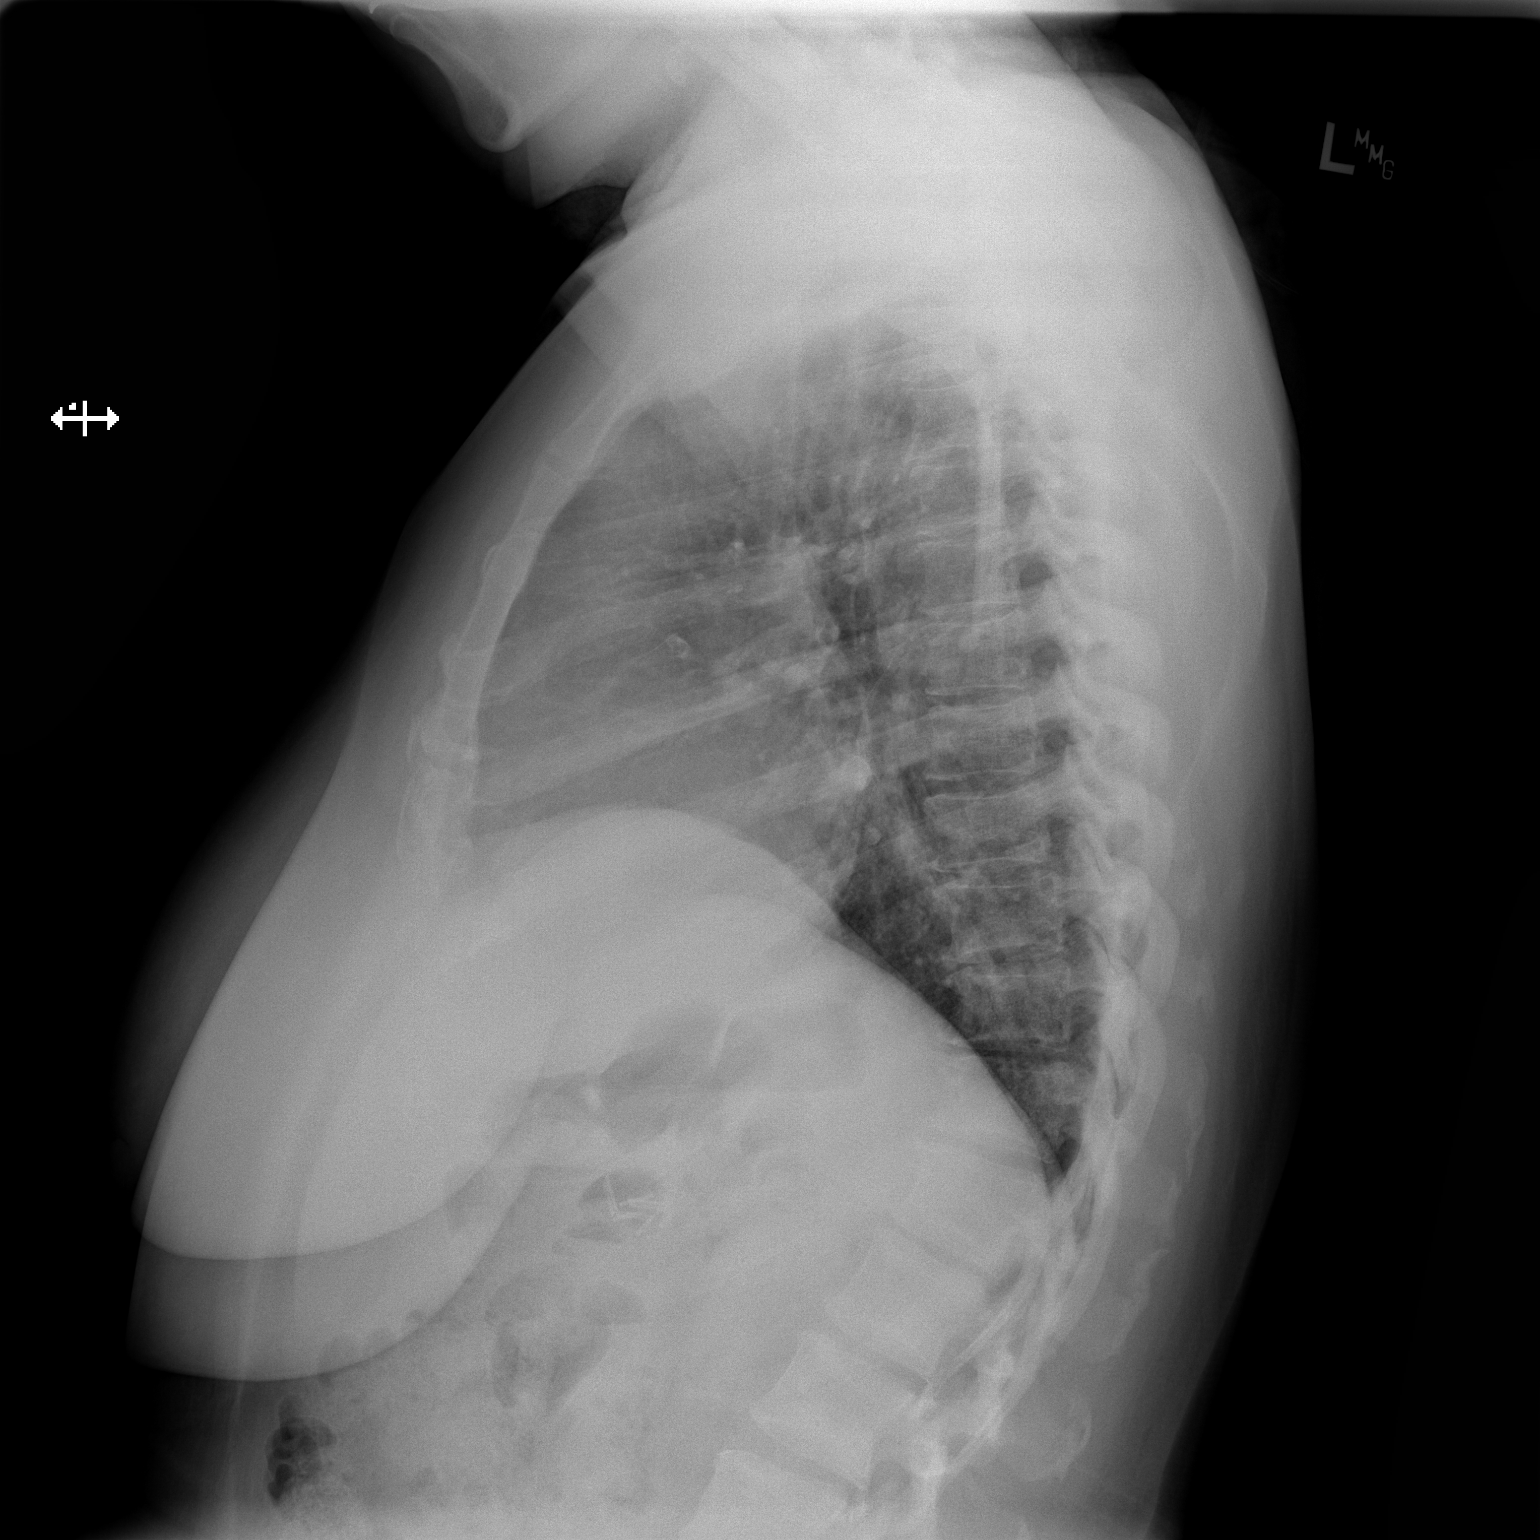

[2 of 2 positions shown; findings below may reference images not displayed]

FINDINGS: Upper normal heart size.

Normal mediastinal contours and pulmonary vascularity.

Lungs clear.

No pleural effusion or pneumothorax.

Surgical clips right upper quadrant most consistent with prior
cholecystectomy.

No acute osseous findings.
IMPRESSION: No acute abnormalities.

## 2015-05-05 ENCOUNTER — Encounter: Payer: Self-pay | Admitting: Internal Medicine

## 2015-05-05 ENCOUNTER — Ambulatory Visit: Payer: Medicare HMO | Attending: Internal Medicine | Admitting: Internal Medicine

## 2015-05-05 VITALS — BP 128/77 | HR 64 | Temp 98.0°F | Resp 16 | Wt 168.2 lb

## 2015-05-05 DIAGNOSIS — Z7982 Long term (current) use of aspirin: Secondary | ICD-10-CM | POA: Diagnosis not present

## 2015-05-05 DIAGNOSIS — E780 Pure hypercholesterolemia: Secondary | ICD-10-CM | POA: Insufficient documentation

## 2015-05-05 DIAGNOSIS — I10 Essential (primary) hypertension: Secondary | ICD-10-CM | POA: Diagnosis not present

## 2015-05-05 DIAGNOSIS — K219 Gastro-esophageal reflux disease without esophagitis: Secondary | ICD-10-CM | POA: Insufficient documentation

## 2015-05-05 DIAGNOSIS — E119 Type 2 diabetes mellitus without complications: Secondary | ICD-10-CM | POA: Diagnosis not present

## 2015-05-05 DIAGNOSIS — E139 Other specified diabetes mellitus without complications: Secondary | ICD-10-CM

## 2015-05-05 DIAGNOSIS — Z79899 Other long term (current) drug therapy: Secondary | ICD-10-CM | POA: Diagnosis not present

## 2015-05-05 DIAGNOSIS — Z139 Encounter for screening, unspecified: Secondary | ICD-10-CM | POA: Diagnosis not present

## 2015-05-05 LAB — POCT GLYCOSYLATED HEMOGLOBIN (HGB A1C): Hemoglobin A1C: 6.2

## 2015-05-05 LAB — GLUCOSE, POCT (MANUAL RESULT ENTRY): POC Glucose: 112 mg/dl — AB (ref 70–99)

## 2015-05-05 MED ORDER — METFORMIN HCL 500 MG PO TABS: 500.0000 mg | ORAL_TABLET | Freq: Two times a day (BID) | ORAL | Status: DC

## 2015-05-05 MED ORDER — SIMVASTATIN 20 MG PO TABS: 20.0000 mg | ORAL_TABLET | Freq: Every day | ORAL | Status: DC

## 2015-05-05 MED ORDER — LISINOPRIL 10 MG PO TABS: 10.0000 mg | ORAL_TABLET | Freq: Every day | ORAL | Status: DC

## 2015-05-05 NOTE — Progress Notes (Signed)
Patient here for follow up on her diabetes and hypertension Patient also requesting refills on her medications 

## 2015-05-05 NOTE — Progress Notes (Signed)
Subjective:     Patient ID: [name] Barker, female   DOB: June 29, 1961, 54 y.o.   MRN: [id number]  HPI [name] Barker is a 54 yo [place] American female with history of essential hypertension and type 2 diabetes here today for a follow up visit. Patient reports taking all of her medications everyday. Reports eating low salt, low sugar diet although in the past few months notes she's been eating meals later in the night. Says she regularly exercises and also walks everywhere because she doesn't have a car. Regularly checks her blood sugar at home and says blood sugar ranges from 97 to 179. Denies any hypoglycemic episodes and notes she knows how to recognize and manage hypoglycemic symptoms. Denies headaches, dizziness or lightheadedness, blurry vision, urinary symptoms, numbness or tingling. Denies dyspnea, SOB, chest pain, palpitations. Denies history of tobacco or alcohol use.   Active Ambulatory Problems    Diagnosis Date Noted  . Diabetes 04/22/2014  . Essential hypertension, benign 04/22/2014  . Other and unspecified hyperlipidemia 07/22/2014  . Esophageal reflux 07/22/2014   Resolved Ambulatory Problems    Diagnosis Date Noted  . No Resolved Ambulatory Problems   Past Medical History  Diagnosis Date  . Hip pain   . Personal history of unspecified circulatory disease   . Diabetes mellitus   . High cholesterol   . S/P cardiac cath June 2008      Medication List       This list is accurate as of: 05/05/15  5:41 PM.  Always use your most recent med list.               aspirin 325 MG tablet  Take 325 mg by mouth daily.     Fish Oil 1000 MG Caps  Take 1 capsule by mouth every morning.     glucose monitoring kit monitoring kit  1 each by Does not apply route as needed for other. 1 month Diabetic Testing Supplies for QAC-QHS accuchecks.     lisinopril 10 MG tablet  Commonly known as:  PRINIVIL,ZESTRIL  Take 1 tablet (10 mg total) by mouth daily.     metFORMIN 500 MG tablet   Commonly known as:  GLUCOPHAGE  Take 1 tablet (500 mg total) by mouth 2 (two) times daily with a meal.     metoCLOPramide 10 MG tablet  Commonly known as:  REGLAN  Take 1 tablet (10 mg total) by mouth 2 (two) times daily.     multivitamin with minerals Tabs tablet  Take 1 tablet by mouth daily.     pantoprazole 40 MG tablet  Commonly known as:  PROTONIX  Take 1 tablet (40 mg total) by mouth daily.     simvastatin 20 MG tablet  Commonly known as:  ZOCOR  Take 1 tablet (20 mg total) by mouth at bedtime.     tetrahydrozoline 0.05 % ophthalmic solution  Place 2 drops into both eyes daily as needed (for dry eyes).         Review of Systems  Constitutional: Negative for activity change and appetite change.  Eyes: Negative for visual disturbance.  Respiratory: Negative for cough, chest tightness and shortness of breath.   Cardiovascular: Negative for chest pain, palpitations and leg swelling.  Gastrointestinal: Negative for nausea, vomiting, diarrhea, constipation and blood in stool.  Genitourinary: Negative for urgency, frequency and difficulty urinating.  Neurological: Negative for weakness, light-headedness and numbness.       Objective: Filed Vitals:   05/05/15 1426  BP: 128/77  Pulse: 64  Temp: 98 F (36.7 C)  Resp: 16      Physical Exam  Constitutional:  Well-appearing, pleasant 54yo AA woman alert, oriented, and in no acute distress.   HENT:  Head: Normocephalic and atraumatic.  Eyes: Conjunctivae and EOM are normal. Pupils are equal, round, and reactive to light. No scleral icterus.  Neck: Normal range of motion. Neck supple.  Cardiovascular: Normal rate, regular rhythm, normal heart sounds and intact distal pulses.   Pulmonary/Chest: Effort normal and breath sounds normal.  Musculoskeletal: Normal range of motion. She exhibits no edema or tenderness.       Assessment & Plan:   [name] Barker is a 54 yo [place] American female with history of essential  hypertension and type 2 diabetes here today for a follow up visit.   Type 2 Diabetes Mellitus without complications  Patient's A1C today is 6.2 (increased from A1C of 5.6 on 01/13/15) and CBG is 112. Counseled patient on importance of eating low sugar diet, avoiding eating late night meals, and daily exercise.  Orders: -     Glucose (CBG) -     HgB A1c -     metFORMIN (GLUCOPHAGE) 500 MG tablet; Take 1 tablet (500 mg total) by mouth 2 (two) times daily with a meal. -     simvastatin (ZOCOR) 20 MG tablet; Take 1 tablet (20 mg total) by mouth at bedtime.  Essential Hypertension  Patient's BP today is 128/77 and is well-controlled.  No medication adjustment needed at this time.  Orders: -     lisinopril (PRINIVIL,ZESTRIL) 10 MG tablet; Take 1 tablet (10 mg total) by mouth daily.  Mammogram Patient is last mammogram on 02/04/14 was normal and patient is due this year for her annual mammogram. Orders: -     MM DIGITAL SCREENING BILATERAL; Future   Patient was seen with medical student Agree with above assessment and plan.  Lorayne Marek, MD   Return in about 3 months (around 08/05/2015) for diabetes, hypertension.

## 2015-05-06 ENCOUNTER — Ambulatory Visit: Payer: Medicare HMO | Admitting: Internal Medicine

## 2015-06-08 ENCOUNTER — Ambulatory Visit (HOSPITAL_COMMUNITY)
Admission: RE | Admit: 2015-06-08 | Discharge: 2015-06-08 | Disposition: A | Discharge status: Home / Self Care | Payer: Medicare HMO | Source: Ambulatory Visit | Attending: Internal Medicine | Admitting: Internal Medicine

## 2015-06-08 DIAGNOSIS — Z1231 Encounter for screening mammogram for malignant neoplasm of breast: Secondary | ICD-10-CM | POA: Insufficient documentation

## 2015-06-08 DIAGNOSIS — Z139 Encounter for screening, unspecified: Secondary | ICD-10-CM

## 2015-09-02 ENCOUNTER — Ambulatory Visit: Payer: Medicare HMO | Attending: Family Medicine | Admitting: Family Medicine

## 2015-09-02 ENCOUNTER — Encounter: Payer: Self-pay | Admitting: Family Medicine

## 2015-09-02 VITALS — BP 133/79 | HR 91 | Temp 98.4°F | Resp 18 | Ht 61.0 in | Wt 169.2 lb

## 2015-09-02 DIAGNOSIS — I10 Essential (primary) hypertension: Secondary | ICD-10-CM | POA: Diagnosis not present

## 2015-09-02 DIAGNOSIS — E119 Type 2 diabetes mellitus without complications: Secondary | ICD-10-CM | POA: Diagnosis not present

## 2015-09-02 DIAGNOSIS — K047 Periapical abscess without sinus: Secondary | ICD-10-CM | POA: Diagnosis not present

## 2015-09-02 LAB — GLUCOSE, POCT (MANUAL RESULT ENTRY): POC GLUCOSE: 90 mg/dL (ref 70–99)

## 2015-09-02 LAB — POCT GLYCOSYLATED HEMOGLOBIN (HGB A1C): Hemoglobin A1C: 5.8

## 2015-09-02 NOTE — Assessment & Plan Note (Signed)
Controlled, continue current medication regimen, diet and activity.

## 2015-09-02 NOTE — Addendum Note (Signed)
Addended by: Margaretmary Lombard on: 09/02/2015 03:36 PM   Modules accepted: Orders

## 2015-09-02 NOTE — Patient Instructions (Signed)
It was a pleasure to meet you today.   I believe the swelling under your right jaw is related to a dental infection-- I recommend seeing a dentist for this.   Continue your blood pressure, diabetes and cholesterol medicine as you have been taking, as well as the aspirin.   Flu shot later this month, to protect from flu this season.   Follow up in 4 months for diabetes follow up.

## 2015-09-02 NOTE — Progress Notes (Signed)
Tongue swelling that began this week. Patient states " i don't know if it is because of my dentures"  Patient is on her menstrual right now. Patient denies pain at the moment.   Patient has taken all her medications this morning.

## 2015-09-02 NOTE — Progress Notes (Signed)
   Subjective:    Patient ID: [name] Barker, female    DOB: [birth year], 54 y.o.   MRN: [id number]  HPI Patient here for follow up DM, HTN.  Taking meds as indicated, reviewed with her. Has refills, no refills needed.   Denies chest pain, palpitations, dyspnea, cough or fevers.  Reports some pain and swelling under the right side of jaw, no sore throat or dryness in mouth.  Has partial dentures, last time she was seen by dentistry was over 2 years ago.   Social Hx Never-smoker.    Review of Systems     Objective:   Physical Exam Well appearing, no distress HEENT Shotty submandibular adenopathy on the RIGHT, mucosa intact and normal-appearing. Removed her partial dentures, poor dentition in remaining teeth, without purulence or erythema at gumline.  Salivary ducts normal in appearance. No parotid tenderness or masses.  Neck supple, full ROM actively.  COR Regular S1S2 PULM Clear bilaterally, no wheezes or rales.  EXTS no pedal edema.        Assessment & Plan:

## 2015-09-02 NOTE — Assessment & Plan Note (Signed)
COntrolled on previous A1C readings, although incremental increase between penultimate and last A1C.  To recheck today, consider increase metformin to  am and  pm if interval increase.

## 2015-11-07 ENCOUNTER — Other Ambulatory Visit: Payer: Self-pay | Admitting: Internal Medicine

## 2015-12-20 ENCOUNTER — Encounter: Payer: Medicare HMO | Admitting: Pharmacist

## 2015-12-27 ENCOUNTER — Ambulatory Visit: Payer: Medicare HMO | Attending: Family Medicine | Admitting: Pharmacist

## 2015-12-27 VITALS — BP 134/85 | HR 92

## 2015-12-27 DIAGNOSIS — E119 Type 2 diabetes mellitus without complications: Secondary | ICD-10-CM | POA: Diagnosis present

## 2015-12-27 LAB — POCT GLYCOSYLATED HEMOGLOBIN (HGB A1C): HEMOGLOBIN A1C: 5.9

## 2015-12-27 NOTE — Progress Notes (Signed)
S:    Patient arrives in good spirits.  Presents for diabetes follow up.  Patient reports adherence with medications. Current diabetes medications include metformin 500 mg BID.  Patient denies hypoglycemic events.  Patient reported dietary habits: eats a lot of meat and veggies.  Patient reported exercise habits: tries to be active   Patient reports nocturia 1-2 times per night.  Patient denies neuropathy. Patient denies visual changes. Patient reports self foot exams.    O:  Lab Results  Component Value Date   HGBA1C 5.80 09/02/2015    Home fasting CBG: 90s-120s   2 hour post-prandial/random CBG: <180.  A/P: Diabetes currently controlled based on A1c of 5.9.   Patient denies hypoglycemic events and is able to verbalize appropriate hypoglycemia management plan.  Patient denies adherence with medication.   Continue metformin 500 mg BID. Congratulated patient on her A1c and efforts to control her diabetes. Will obtain BMET today as she has not had one in a year.   Next A1C anticipated April 2017.  Medication reconciliation completed. Written patient instructions provided.  Total time in face to face counseling 20 minutes.   Follow up in Pharmacist Clinic Visit as needed. Needs to be established with a new provider.

## 2015-12-27 NOTE — Patient Instructions (Signed)
Thanks for coming to see me!  Your A1c is great! Keep up the awesome work.  Schedule an appointment with a new doctor since Dr. Orpah CobbAdvani is gone

## 2015-12-28 LAB — BASIC METABOLIC PANEL
BUN: 10 mg/dL (ref 7–25)
CO2: 27 mmol/L (ref 20–31)
CREATININE: 0.94 mg/dL (ref 0.50–1.05)
Calcium: 9.2 mg/dL (ref 8.6–10.4)
Chloride: 101 mmol/L (ref 98–110)
GLUCOSE: 110 mg/dL — AB (ref 65–99)
POTASSIUM: 4.8 mmol/L (ref 3.5–5.3)
Sodium: 136 mmol/L (ref 135–146)

## 2016-01-05 ENCOUNTER — Other Ambulatory Visit: Payer: Self-pay | Admitting: Family Medicine

## 2016-01-05 ENCOUNTER — Encounter: Payer: Self-pay | Admitting: Family Medicine

## 2016-01-05 ENCOUNTER — Ambulatory Visit: Payer: Medicare HMO | Attending: Family Medicine | Admitting: Family Medicine

## 2016-01-05 VITALS — BP 129/78 | HR 95 | Temp 97.7°F | Resp 16 | Ht 61.0 in | Wt 174.0 lb

## 2016-01-05 DIAGNOSIS — Z79899 Other long term (current) drug therapy: Secondary | ICD-10-CM | POA: Insufficient documentation

## 2016-01-05 DIAGNOSIS — Z6832 Body mass index (BMI) 32.0-32.9, adult: Secondary | ICD-10-CM | POA: Insufficient documentation

## 2016-01-05 DIAGNOSIS — Z7984 Long term (current) use of oral hypoglycemic drugs: Secondary | ICD-10-CM | POA: Insufficient documentation

## 2016-01-05 DIAGNOSIS — Z114 Encounter for screening for human immunodeficiency virus [HIV]: Secondary | ICD-10-CM

## 2016-01-05 DIAGNOSIS — E119 Type 2 diabetes mellitus without complications: Secondary | ICD-10-CM

## 2016-01-05 DIAGNOSIS — L210 Seborrhea capitis: Secondary | ICD-10-CM

## 2016-01-05 DIAGNOSIS — Z7982 Long term (current) use of aspirin: Secondary | ICD-10-CM | POA: Diagnosis not present

## 2016-01-05 DIAGNOSIS — I10 Essential (primary) hypertension: Secondary | ICD-10-CM | POA: Diagnosis not present

## 2016-01-05 DIAGNOSIS — E118 Type 2 diabetes mellitus with unspecified complications: Secondary | ICD-10-CM

## 2016-01-05 DIAGNOSIS — Z1159 Encounter for screening for other viral diseases: Secondary | ICD-10-CM

## 2016-01-05 LAB — GLUCOSE, POCT (MANUAL RESULT ENTRY): POC GLUCOSE: 78 mg/dL (ref 70–99)

## 2016-01-05 MED ORDER — METFORMIN HCL 500 MG PO TABS: 500.0000 mg | ORAL_TABLET | Freq: Two times a day (BID) | ORAL | Status: DC

## 2016-01-05 MED ORDER — SELENIUM SULFIDE 2.25 % EX SHAM: 1.0000 "application " | MEDICATED_SHAMPOO | CUTANEOUS | Status: DC

## 2016-01-05 MED ORDER — LISINOPRIL 10 MG PO TABS: 10.0000 mg | ORAL_TABLET | Freq: Every day | ORAL | Status: DC

## 2016-01-05 MED FILL — ?METFORMIN HCL 500MG TABLET: 500 | 30 days supply | Qty: 60 | Fill #0

## 2016-01-05 NOTE — Patient Instructions (Signed)
[name] Barker was seen today for diabetes and hypertension.  Diagnoses and all orders for this visit:  Type 2 diabetes mellitus with complication, unspecified long term insulin use status (HCC) -     POCT glucose (manual entry)     Well controlled diabetes and HTN Continue current treatment   F/u in 4-6 weeks for pap smear  Dr. Armen PickupFunches

## 2016-01-05 NOTE — Progress Notes (Signed)
F/U DM HTN Glucose running 140-181 Taking medication as prescribed  No pain today  No tobacco user  No suicidal though in the past two weeks

## 2016-01-05 NOTE — Progress Notes (Signed)
Subjective:  Patient ID: [name] Barker, female    DOB: 08/18/1961  Age: 55 y.o. MRN: [id number]  CC: Diabetes and Hypertension   HPI Monque Haggar presents for   1. CHRONIC DIABETES  Disease Monitoring  Blood Sugar Ranges: 140-181  Polyuria: no   Visual problems: no   Medication Compliance: yes  Medication Side Effects  Hypoglycemia: no    2. CHRONIC HYPERTENSION  Disease Monitoring  Blood pressure range: not checking   Chest pain: no   Dyspnea: no   Claudication: no   Medication compliance: yes  Medication Side Effects  Lightheadedness: no   Urinary frequency: no   Edema: no   Impotence: no     3. Dry skin: around scalp. Around nose and in ears. Chronic.   Social History  Substance Use Topics  . Smoking status: Never Smoker   . Smokeless tobacco: Never Used  . Alcohol Use: No    Outpatient Prescriptions Prior to Visit  Medication Sig Dispense Refill  . aspirin 325 MG tablet Take 325 mg by mouth daily.      Marland Kitchen glucose monitoring kit (FREESTYLE) monitoring kit 1 each by Does not apply route as needed for other. 1 month Diabetic Testing Supplies for QAC-QHS accuchecks. 1 each 1  . lisinopril (PRINIVIL,ZESTRIL) 10 MG tablet Take 1 tablet (10 mg total) by mouth daily. 90 tablet 3  . metFORMIN (GLUCOPHAGE) 500 MG tablet TAKE 1 TABLET BY MOUTH 2 TIMES DAILY WITH A MEAL. 60 tablet 3  . Multiple Vitamin (MULTIVITAMIN WITH MINERALS) TABS tablet Take 1 tablet by mouth daily.    . Omega-3 Fatty Acids (FISH OIL) 1000 MG CAPS Take 1 capsule by mouth every morning.     . simvastatin (ZOCOR) 20 MG tablet Take 1 tablet (20 mg total) by mouth at bedtime. 90 tablet 3  . tetrahydrozoline 0.05 % ophthalmic solution Place 2 drops into both eyes daily as needed (for dry eyes).     No facility-administered medications prior to visit.    ROS Review of Systems  Constitutional: Negative for fever and chills.  Eyes: Negative for visual disturbance.  Respiratory: Negative for  shortness of breath.   Cardiovascular: Negative for chest pain.  Gastrointestinal: Negative for abdominal pain and blood in stool.  Musculoskeletal: Negative for back pain and arthralgias.  Skin: Negative for rash.  Allergic/Immunologic: Negative for immunocompromised state.  Hematological: Negative for adenopathy. Does not bruise/bleed easily.  Psychiatric/Behavioral: Negative for suicidal ideas and dysphoric mood.    Objective:  BP 129/78 mmHg  Pulse 95  Temp(Src) 97.7 F (36.5 C) (Oral)  Resp 16  Ht '5\' 1"'  (1.549 m)  Wt 174 lb (78.926 kg)  BMI 32.89 kg/m2  SpO2 98%  LMP 12/26/2015  BP/Weight 01/05/2016 07/24/8562 12/27/9700  Systolic BP 637 858 850  Diastolic BP 78 85 79  Wt. (Lbs) 174 - 169.2  BMI 32.89 - 31.99     Physical Exam  Constitutional: She is oriented to person, place, and time. She appears well-developed and well-nourished. No distress.  HENT:  Head: Normocephalic and atraumatic.  Cardiovascular: Normal rate, regular rhythm, normal heart sounds and intact distal pulses.   Pulmonary/Chest: Effort normal and breath sounds normal.  Musculoskeletal: She exhibits no edema.  Neurological: She is alert and oriented to person, place, and time.  Skin: Skin is warm and dry. No rash noted.  Psychiatric: She has a normal mood and affect.   Lab Results  Component Value Date   HGBA1C 5.90 12/27/2015  CBG 78 Assessment & Plan:   Problem List Items Addressed This Visit    Diabetes ([place]) - Primary (Chronic)   Relevant Medications   lisinopril (PRINIVIL,ZESTRIL) 10 MG tablet   metFORMIN (GLUCOPHAGE) 500 MG tablet   Other Relevant Orders   POCT glucose (manual entry) (Completed)   Essential hypertension, benign (Chronic)   Relevant Medications   lisinopril (PRINIVIL,ZESTRIL) 10 MG tablet    Other Visit Diagnoses    Screening for HIV (human immunodeficiency virus)        Relevant Orders    HIV antibody (with reflex)    Need for hepatitis C screening test         Relevant Orders    Hepatitis C antibody, reflex    Seborrhea capitis        Relevant Medications    Selenium Sulfide 2.25 % SHAM       No orders of the defined types were placed in this encounter.    Follow-up: No Follow-up on file.   Boykin Nearing MD

## 2016-01-06 ENCOUNTER — Telehealth: Payer: Self-pay | Admitting: *Deleted

## 2016-01-06 LAB — HIV ANTIBODY (ROUTINE TESTING W REFLEX): HIV: NONREACTIVE

## 2016-01-06 LAB — HEPATITIS C ANTIBODY: HCV Ab: NEGATIVE

## 2016-01-06 NOTE — Telephone Encounter (Signed)
-----   Message from Dessa PhiJosalyn Funches, MD sent at 01/06/2016  8:26 AM EST ----- Screening HIV negative

## 2016-01-06 NOTE — Telephone Encounter (Signed)
Date of birth verified by pt  Negative lab results given  Pt verbalized understanding  

## 2016-01-06 NOTE — Telephone Encounter (Signed)
-----   Message from Dessa PhiJosalyn Funches, MD sent at 01/06/2016  8:28 AM EST ----- Screening HIV negative

## 2016-01-13 MED FILL — ?LISINOPRIL 10 MG TABLET: 10 | 30 days supply | Qty: 30 | Fill #0

## 2016-01-13 MED FILL — SIMVASTATIN 20 MG TABLET: 20 | 30 days supply | Qty: 30 | Fill #7

## 2016-02-15 MED FILL — metFORMIN HCL 500 MG TABS: 500 | 30 days supply | Qty: 60 | Fill #1

## 2016-02-15 MED FILL — SIMVASTATIN 20 MG TABLET: 20 | 30 days supply | Qty: 30 | Fill #8

## 2016-02-15 MED FILL — LISINOPRIL 10 MG TABLET: 10 | 30 days supply | Qty: 30 | Fill #1

## 2016-02-28 ENCOUNTER — Ambulatory Visit: Payer: Medicare HMO | Attending: Family Medicine | Admitting: Family Medicine

## 2016-02-28 ENCOUNTER — Encounter: Payer: Self-pay | Admitting: Family Medicine

## 2016-02-28 ENCOUNTER — Other Ambulatory Visit (HOSPITAL_COMMUNITY)
Admission: RE | Admit: 2016-02-28 | Discharge: 2016-02-28 | Disposition: A | Discharge status: Home / Self Care | Payer: Medicare HMO | Source: Ambulatory Visit | Attending: Family Medicine | Admitting: Family Medicine

## 2016-02-28 VITALS — BP 129/80 | HR 88 | Temp 98.8°F | Resp 16 | Ht 61.0 in | Wt 174.0 lb

## 2016-02-28 DIAGNOSIS — A084 Viral intestinal infection, unspecified: Secondary | ICD-10-CM

## 2016-02-28 DIAGNOSIS — Z01419 Encounter for gynecological examination (general) (routine) without abnormal findings: Secondary | ICD-10-CM | POA: Insufficient documentation

## 2016-02-28 DIAGNOSIS — N760 Acute vaginitis: Secondary | ICD-10-CM | POA: Insufficient documentation

## 2016-02-28 DIAGNOSIS — Z1151 Encounter for screening for human papillomavirus (HPV): Secondary | ICD-10-CM | POA: Insufficient documentation

## 2016-02-28 DIAGNOSIS — Z124 Encounter for screening for malignant neoplasm of cervix: Secondary | ICD-10-CM | POA: Diagnosis not present

## 2016-02-28 DIAGNOSIS — Z113 Encounter for screening for infections with a predominantly sexual mode of transmission: Secondary | ICD-10-CM | POA: Diagnosis present

## 2016-02-28 MED ORDER — ONDANSETRON 8 MG PO TBDP: 8.0000 mg | ORAL_TABLET | Freq: Three times a day (TID) | ORAL | Status: DC | PRN

## 2016-02-28 NOTE — Progress Notes (Signed)
Pap smear. Last pap normal results  Vaginal discharge-clear color  No sexually active  No tobacco user  No suicidal thoughts in the pas the past two weeks

## 2016-02-28 NOTE — Patient Instructions (Addendum)
[name] Barker was seen today for gynecologic exam.  Diagnoses and all orders for this visit:  Pap smear for cervical cancer screening -     Cytology - PAP  Viral gastroenteritis -     ondansetron (ZOFRAN ODT) 8 MG disintegrating tablet; Take 1 tablet (8 mg total) by mouth every 8 (eight) hours as needed for nausea or vomiting.   Please sign up for mychart to receive pap results  F/u in 6 months for diabetes and HTN  Dr. Armen PickupFunches   Viral Gastroenteritis Viral gastroenteritis is also known as stomach flu. This condition affects the stomach and intestinal tract. It can cause sudden diarrhea and vomiting. The illness typically lasts 3 to 8 days. Most people develop an immune response that eventually gets rid of the virus. While this natural response develops, the virus can make you quite ill. CAUSES  Many different viruses can cause gastroenteritis, such as rotavirus or noroviruses. You can catch one of these viruses by consuming contaminated food or water. You may also catch a virus by sharing utensils or other personal items with an infected person or by touching a contaminated surface. SYMPTOMS  The most common symptoms are diarrhea and vomiting. These problems can cause a severe loss of body fluids (dehydration) and a body salt (electrolyte) imbalance. Other symptoms may include:  Fever.  Headache.  Fatigue.  Abdominal pain. DIAGNOSIS  Your caregiver can usually diagnose viral gastroenteritis based on your symptoms and a physical exam. A stool sample may also be taken to test for the presence of viruses or other infections. TREATMENT  This illness typically goes away on its own. Treatments are aimed at rehydration. The most serious cases of viral gastroenteritis involve vomiting so severely that you are not able to keep fluids down. In these cases, fluids must be given through an intravenous line (IV). HOME CARE INSTRUCTIONS   Drink enough fluids to keep your urine clear or pale yellow.  Drink small amounts of fluids frequently and increase the amounts as tolerated.  Ask your caregiver for specific rehydration instructions.  Avoid:  Foods high in sugar.  Alcohol.  Carbonated drinks.  Tobacco.  Juice.  Caffeine drinks.  Extremely hot or cold fluids.  Fatty, greasy foods.  Too much intake of anything at one time.  Dairy products until 24 to 48 hours after diarrhea stops.  You may consume probiotics. Probiotics are active cultures of beneficial bacteria. They may lessen the amount and number of diarrheal stools in adults. Probiotics can be found in yogurt with active cultures and in supplements.  Wash your hands well to avoid spreading the virus.  Only take over-the-counter or prescription medicines for pain, discomfort, or fever as directed by your caregiver. Do not give aspirin to children. Antidiarrheal medicines are not recommended.  Ask your caregiver if you should continue to take your regular prescribed and over-the-counter medicines.  Keep all follow-up appointments as directed by your caregiver. SEEK IMMEDIATE MEDICAL CARE IF:   You are unable to keep fluids down.  You do not urinate at least once every 6 to 8 hours.  You develop shortness of breath.  You notice blood in your stool or vomit. This may look like coffee grounds.  You have abdominal pain that increases or is concentrated in one small area (localized).  You have persistent vomiting or diarrhea.  You have a fever.  The patient is a child younger than 3 months, and he or she has a fever.  The patient is a child  older than 3 months, and he or she has a fever and persistent symptoms.  The patient is a child older than 3 months, and he or she has a fever and symptoms suddenly get worse.  The patient is a baby, and he or she has no tears when crying. MAKE SURE YOU:   Understand these instructions.  Will watch your condition.  Will get help right away if you are not doing  well or get worse.   This information is not intended to replace advice given to you by your health care provider. Make sure you discuss any questions you have with your health care provider.   Document Released: 12/10/2005 Document Revised: 03/03/2012 Document Reviewed: 09/26/2011 Elsevier Interactive Patient Education [employer]

## 2016-02-28 NOTE — Progress Notes (Signed)
SUBJECTIVE:  55 y.o. female for annual routine Pap.   1. Pap: no hx of abnormal pap. No pelvic pain, vag discharge or vag bleeding.   2.  Cold symptoms; having cough, congestion, stomach upset and nausea x 2 days. No fever or chill. Requesting anti-emetic.   Social History  Substance Use Topics  . Smoking status: Never Smoker   . Smokeless tobacco: Never Used  . Alcohol Use: No   Current Outpatient Prescriptions  Medication Sig Dispense Refill  . aspirin 325 MG tablet Take 325 mg by mouth daily.      Marland Kitchen glucose monitoring kit (FREESTYLE) monitoring kit 1 each by Does not apply route as needed for other. 1 month Diabetic Testing Supplies for QAC-QHS accuchecks. 1 each 1  . lisinopril (PRINIVIL,ZESTRIL) 10 MG tablet Take 1 tablet (10 mg total) by mouth daily. 90 tablet 3  . metFORMIN (GLUCOPHAGE) 500 MG tablet Take 1 tablet (500 mg total) by mouth 2 (two) times daily with a meal. 180 tablet 3  . Multiple Vitamin (MULTIVITAMIN WITH MINERALS) TABS tablet Take 1 tablet by mouth daily.    . Omega-3 Fatty Acids (FISH OIL) 1000 MG CAPS Take 1 capsule by mouth every morning.     . Selenium Sulfide 2.25 % SHAM Apply 1 application topically 3 (three) times a week. 180 mL 2  . simvastatin (ZOCOR) 20 MG tablet Take 1 tablet (20 mg total) by mouth at bedtime. 90 tablet 3  . tetrahydrozoline 0.05 % ophthalmic solution Place 2 drops into both eyes daily as needed (for dry eyes).     No current facility-administered medications for this visit.   Allergies: Review of patient's allergies indicates no known allergies.  Patient's last menstrual period was 02/21/2016.  ROS:   No dyspnea or chest pain on exertion.  No urinary tract symptoms. GYN ROS: normal menses, no abnormal bleeding, pelvic pain or discharge, no breast pain or new or enlarging lumps on self exam..  OBJECTIVE:  The patient appears well, alert, oriented x 3, in no distress. BP 129/80 mmHg  Pulse 88  Temp(Src) 98.8 F (37.1 C) (Oral)   Resp 16  Ht '5\' 1"'  (1.549 m)  Wt 174 lb (78.926 kg)  BMI 32.89 kg/m2  SpO2 100%  LMP 02/21/2016 ENT normal.  Neck supple. No adenopathy or thyromegaly. PERLA. Lungs are clear, good air entry, no wheezes, rhonchi or rales. S1 and S2 normal, no murmurs, regular rate and rhythm. Abdomen soft without tenderness, guarding, mass or organomegaly. Extremities show no edema, normal peripheral pulses. Neurological is normal, no focal findings.  BREAST EXAM:  not examined  PELVIC EXAM: normal external genitalia, vulva, vagina, cervix, uterus and adnexa  ASSESSMENT:  well woman  PLAN:  pap smear  [name] Barker was seen today for gynecologic exam.  Diagnoses and all orders for this visit:  Pap smear for cervical cancer screening -     Cytology - PAP  Viral gastroenteritis -     ondansetron (ZOFRAN ODT) 8 MG disintegrating tablet; Take 1 tablet (8 mg total) by mouth every 8 (eight) hours as needed for nausea or vomiting.

## 2016-02-29 ENCOUNTER — Encounter: Payer: Self-pay | Admitting: Clinical

## 2016-02-29 MED FILL — ONDANSETRON ODT 8 MG TABLET: 8 | 3 days supply | Qty: 20 | Fill #0

## 2016-02-29 NOTE — Progress Notes (Signed)
[facility] 02/28/2016 01/05/2016 09/02/2015 04/22/2014 01/20/2014  Decreased Interest 3 3 0 0 0  Down, Depressed, Hopeless 0 0 0 0 0  PHQ - 2 Score 3 3 0 0 0  Altered sleeping 2 2 - - -  Tired, decreased energy 0 2 - - -  Change in appetite 2 1 - - -  Feeling bad or failure about yourself  0 0 - - -  Trouble concentrating 0 0 - - -  Moving slowly or fidgety/restless 0 0 - - -  Suicidal thoughts 0 0 - - -  PHQ-9 Score 7 8 - - -    GAD 7 : Generalized Anxiety Score 02/28/2016 01/05/2016  Nervous, Anxious, on Edge 0 0  Control/stop worrying 1 0  Worry too much - different things 0 0  Trouble relaxing 2 3  Restless 2 0  Easily annoyed or irritable 0 3  Afraid - awful might happen 1 0  Total GAD 7 Score 6 6   * Pt writes "money problems make me worry"

## 2016-03-01 ENCOUNTER — Telehealth: Payer: Self-pay | Admitting: *Deleted

## 2016-03-01 LAB — CERVICOVAGINAL ANCILLARY ONLY
Chlamydia: NEGATIVE
Neisseria Gonorrhea: NEGATIVE
Wet Prep (BD Affirm): POSITIVE — AB

## 2016-03-01 NOTE — Telephone Encounter (Signed)
-----   Message from Dessa PhiJosalyn Funches, MD sent at 03/01/2016 11:06 AM EST ----- BV on wet prep Bacterial vaginosis (BV) on wet prep This is not an STD This is an overgrowth of bacteria that can be treated with flagyl 500 mg twice daily followed by difucan 150 mg once to prevent yeast.  However it does not need to be treated if there is no bothersome discharge or odor If there is bothersome discharge or odor, RMA please send in flagyl followed by diflucan

## 2016-03-01 NOTE — Telephone Encounter (Signed)
-----   Message from Dessa PhiJosalyn Funches, MD sent at 03/01/2016 12:36 PM EST ----- Negative GC/chlam

## 2016-03-02 LAB — CYTOLOGY - PAP

## 2016-03-02 NOTE — Telephone Encounter (Signed)
Date of birth verified by pt  Wet, Gc/Chlamidia results given  Pt stated not having discharge or odor at this time  Only normal discharge after menses. No antibiotic prescribed at this time  Pt stated If any heavy discharge or odor in the future will call for Rx

## 2016-03-16 MED FILL — LISINOPRIL 10 MG TABLET: 10 | 30 days supply | Qty: 30 | Fill #2

## 2016-03-16 MED FILL — metFORMIN HCL 500 MG TABS: 500 | 30 days supply | Qty: 60 | Fill #2

## 2016-03-16 MED FILL — SIMVASTATIN 20 MG TABLET: 20 | 30 days supply | Qty: 30 | Fill #9

## 2016-04-17 MED FILL — LISINOPRIL 10 MG TABLET: 10 | 30 days supply | Qty: 30 | Fill #3

## 2016-04-17 MED FILL — metFORMIN HCL 500 MG TABS: 500 | 30 days supply | Qty: 60 | Fill #3

## 2016-05-28 MED FILL — metFORMIN HCL 500 MG TABS: 500 | 30 days supply | Qty: 60 | Fill #4

## 2016-05-28 MED FILL — LISINOPRIL 10 MG TABLET: 10 | 30 days supply | Qty: 30 | Fill #4

## 2016-05-29 ENCOUNTER — Other Ambulatory Visit: Payer: Self-pay | Admitting: Internal Medicine

## 2016-05-29 MED FILL — ?SIMVASTATIN 20 MG TABLET: 20 MG | 30 days supply | Qty: 30 | Fill #0

## 2016-06-04 ENCOUNTER — Other Ambulatory Visit: Payer: Self-pay | Admitting: Family Medicine

## 2016-06-04 DIAGNOSIS — E119 Type 2 diabetes mellitus without complications: Secondary | ICD-10-CM

## 2016-06-28 MED FILL — ?SIMVASTATIN 20 MG TABLET: 20 MG | 30 days supply | Qty: 30 | Fill #1

## 2016-06-28 MED FILL — ?LISINOPRIL 10 MG TABLET: 10 | 30 days supply | Qty: 30 | Fill #5

## 2016-06-28 MED FILL — ?METFORMIN HCL 500MG TABLET: 500 | 30 days supply | Qty: 60 | Fill #5

## 2016-07-31 MED FILL — ?LISINOPRIL 10 MG TABLET: 10 | 30 days supply | Qty: 30 | Fill #6

## 2016-07-31 MED FILL — metFORMIN HCL 500 MG TABS: 500 | 30 days supply | Qty: 60 | Fill #6

## 2016-07-31 MED FILL — ?SIMVASTATIN 20 MG TABLET: 20 MG | 30 days supply | Qty: 30 | Fill #2

## 2016-08-28 ENCOUNTER — Other Ambulatory Visit: Payer: Self-pay | Admitting: Family Medicine

## 2016-08-28 MED FILL — SIMVASTATIN 20 MG TABLET: 20 | 30 days supply | Qty: 30 | Fill #0

## 2016-08-28 MED FILL — LISINOPRIL 10 MG TABLET: 10 | 30 days supply | Qty: 30 | Fill #7

## 2016-08-28 MED FILL — ?METFORMIN HCL 500MG TABLET: 500 | 30 days supply | Qty: 60 | Fill #7

## 2016-09-13 ENCOUNTER — Encounter: Payer: Self-pay | Admitting: Family Medicine

## 2016-09-13 ENCOUNTER — Ambulatory Visit: Payer: Medicare HMO | Attending: Family Medicine | Admitting: Family Medicine

## 2016-09-13 VITALS — BP 116/73 | HR 89 | Temp 97.7°F | Ht 61.0 in | Wt 161.8 lb

## 2016-09-13 DIAGNOSIS — G4700 Insomnia, unspecified: Secondary | ICD-10-CM | POA: Diagnosis present

## 2016-09-13 DIAGNOSIS — Z794 Long term (current) use of insulin: Secondary | ICD-10-CM | POA: Insufficient documentation

## 2016-09-13 DIAGNOSIS — Z23 Encounter for immunization: Secondary | ICD-10-CM

## 2016-09-13 DIAGNOSIS — Z780 Asymptomatic menopausal state: Secondary | ICD-10-CM | POA: Diagnosis not present

## 2016-09-13 DIAGNOSIS — I10 Essential (primary) hypertension: Secondary | ICD-10-CM

## 2016-09-13 DIAGNOSIS — G4701 Insomnia due to medical condition: Secondary | ICD-10-CM | POA: Insufficient documentation

## 2016-09-13 DIAGNOSIS — E118 Type 2 diabetes mellitus with unspecified complications: Secondary | ICD-10-CM | POA: Diagnosis not present

## 2016-09-13 DIAGNOSIS — E119 Type 2 diabetes mellitus without complications: Secondary | ICD-10-CM | POA: Diagnosis present

## 2016-09-13 DIAGNOSIS — N951 Menopausal and female climacteric states: Secondary | ICD-10-CM

## 2016-09-13 DIAGNOSIS — E785 Hyperlipidemia, unspecified: Secondary | ICD-10-CM | POA: Insufficient documentation

## 2016-09-13 DIAGNOSIS — E1169 Type 2 diabetes mellitus with other specified complication: Secondary | ICD-10-CM

## 2016-09-13 LAB — POCT GLYCOSYLATED HEMOGLOBIN (HGB A1C): Hemoglobin A1C: 5.9

## 2016-09-13 LAB — LIPID PANEL
CHOL/HDL RATIO: 2.9 ratio (ref <=5.0)
Cholesterol: 163 mg/dL (ref 125–200)
HDL: 57 mg/dL (ref >=46)
LDL Cholesterol: 83 mg/dL (ref <130)
TRIGLYCERIDES: 113 mg/dL (ref <150)
VLDL: 23 mg/dL (ref <30)

## 2016-09-13 LAB — GLUCOSE, POCT (MANUAL RESULT ENTRY): POC Glucose: 96 mg/dl (ref 70–99)

## 2016-09-13 MED ORDER — METFORMIN HCL 500 MG PO TABS: 500.0000 mg | ORAL_TABLET | Freq: Two times a day (BID) | ORAL | 3 refills | Status: DC

## 2016-09-13 MED ORDER — SIMVASTATIN 20 MG PO TABS: 20.0000 mg | ORAL_TABLET | Freq: Every day | ORAL | 3 refills | Status: DC

## 2016-09-13 MED ORDER — LISINOPRIL 10 MG PO TABS: 10.0000 mg | ORAL_TABLET | Freq: Every day | ORAL | 3 refills | Status: DC

## 2016-09-13 MED ORDER — VENLAFAXINE HCL 37.5 MG PO TABS: ORAL_TABLET | ORAL | 5 refills | Status: DC

## 2016-09-13 MED FILL — VENLAFAXINE HCL 37.5 MG TAB: 37.5 | 30 days supply | Qty: 60 | Fill #0

## 2016-09-13 MED FILL — metFORMIN HCL 500 MG TABS: 500 | 30 days supply | Qty: 60 | Fill #0

## 2016-09-13 MED FILL — LISINOPRIL 10 MG TABLET: 10 | 30 days supply | Qty: 30 | Fill #0

## 2016-09-13 MED FILL — SIMVASTATIN 20 MG TABLET: 20 | 30 days supply | Qty: 30 | Fill #0

## 2016-09-13 NOTE — Patient Instructions (Addendum)
[name] Barker was seen today for hypertension and diabetes.  Diagnoses and all orders for this visit:  Type 2 diabetes mellitus with complication, unspecified long term insulin use status (HCC) -     POCT glucose (manual entry) -     POCT glycosylated hemoglobin (Hb A1C) -     metFORMIN (GLUCOPHAGE) 500 MG tablet; Take 1 tablet (500 mg total) by mouth 2 (two) times daily with a meal. -     simvastatin (ZOCOR) 20 MG tablet; Take 1 tablet (20 mg total) by mouth at bedtime. -     Lipid Panel  Essential hypertension, benign -     lisinopril (PRINIVIL,ZESTRIL) 10 MG tablet; Take 1 tablet (10 mg total) by mouth daily.  Insomnia due to medical condition -     venlafaxine (EFFEXOR) 37.5 MG tablet; Take one pill daily in the morning for 3 days, then 1 pill twice daily  Perimenopausal -     venlafaxine (EFFEXOR) 37.5 MG tablet; Take one pill daily in the morning for 3 days, then 1 pill twice daily  Hyperlipidemia associated with type 2 diabetes mellitus (HCC) -     Lipid Panel    F/u in 6 weeks for insomnia   Dr. Armen PickupFunches

## 2016-09-13 NOTE — Progress Notes (Signed)
Pt is getting flu shot today. Pt want to discuss her not sleeping.

## 2016-09-13 NOTE — Progress Notes (Signed)
Subjective:  Patient ID: [name] Barker, female    DOB: September 08, 1961  Age: 55 y.o. MRN: [id number]  CC: Hypertension and Diabetes   HPI Anabia Weatherwax presents for   1. Insomnia: for past two months. Having trouble falling asleep. Hard time getting relaxed. Sleeping about 3-4 hrs a night. Tired when she wakes up in the morning. She does snore. She sleeps alone most of the time. Denies depression or anxious. Denies uncontrolled pain. She is having hot flashes at night.   2. DM2: taking metformin. No GI upset. She has frequent urination. No blurry vision, no polydipsia.  3. HTN: taking lisinopril. Has cough now and then. No excessive cough. No CP or SOB. No leg swelling. Working out twice weekly at [employer] for 2 hrs.   Social History  Substance Use Topics  . Smoking status: Never Smoker  . Smokeless tobacco: Never Used  . Alcohol use No    Outpatient Medications Prior to Visit  Medication Sig Dispense Refill  . aspirin 325 MG tablet Take 325 mg by mouth daily.      Marland Kitchen glucose monitoring kit (FREESTYLE) monitoring kit 1 each by Does not apply route as needed for other. 1 month Diabetic Testing Supplies for QAC-QHS accuchecks. 1 each 1  . lisinopril (PRINIVIL,ZESTRIL) 10 MG tablet Take 1 tablet (10 mg total) by mouth daily. 90 tablet 3  . metFORMIN (GLUCOPHAGE) 500 MG tablet Take 1 tablet (500 mg total) by mouth 2 (two) times daily with a meal. 180 tablet 3  . Multiple Vitamin (MULTIVITAMIN WITH MINERALS) TABS tablet Take 1 tablet by mouth daily.    . Omega-3 Fatty Acids (FISH OIL) 1000 MG CAPS Take 1 capsule by mouth every morning.     . simvastatin (ZOCOR) 20 MG tablet TAKE 1 TABLET BY MOUTH AT BEDTIME 90 tablet 0  . tetrahydrozoline 0.05 % ophthalmic solution Place 2 drops into both eyes daily as needed (for dry eyes).    . ondansetron (ZOFRAN ODT) 8 MG disintegrating tablet Take 1 tablet (8 mg total) by mouth every 8 (eight) hours as needed for nausea or vomiting. (Patient not  taking: Reported on 09/13/2016) 20 tablet 0  . Selenium Sulfide 2.25 % SHAM Apply 1 application topically 3 (three) times a week. (Patient not taking: Reported on 09/13/2016) 180 mL 2   No facility-administered medications prior to visit.     ROS Review of Systems  Constitutional: Negative for chills and fever.  Eyes: Negative for visual disturbance.  Respiratory: Negative for shortness of breath.   Cardiovascular: Negative for chest pain.  Gastrointestinal: Negative for abdominal pain and blood in stool.  Musculoskeletal: Negative for arthralgias and back pain.  Skin: Negative for rash.  Allergic/Immunologic: Negative for immunocompromised state.  Hematological: Negative for adenopathy. Does not bruise/bleed easily.  Psychiatric/Behavioral: Positive for sleep disturbance. Negative for dysphoric mood and suicidal ideas.    Objective:  BP 116/73 (BP Location: Right Arm, Patient Position: Sitting, Cuff Size: Small)   Pulse 89   Temp 97.7 F (36.5 C) (Oral)   Ht _0  (1.549 m)   Wt 161 lb 12.8 oz (73.4 kg)   LMP 12/25/2015   SpO2 98%   BMI 30.57 kg/m   BP/Weight 09/13/2016 02/28/2016 3/81/0175  Systolic BP 102 585 277  Diastolic BP 73 80 78  Wt. (Lbs) 161.8 174 174  BMI 30.57 32.89 32.89   Physical Exam  Constitutional: She is oriented to person, place, and time. She appears well-developed and well-nourished. No distress.  HENT:  Head: Normocephalic and atraumatic.  Nose: Mucosal edema present.  Cardiovascular: Normal rate, regular rhythm, normal heart sounds and intact distal pulses.   Pulmonary/Chest: Effort normal and breath sounds normal.  Musculoskeletal: She exhibits no edema.  Neurological: She is alert and oriented to person, place, and time.  Skin: Skin is warm and dry. No rash noted.  Psychiatric: She has a normal mood and affect.   Lab Results  Component Value Date   HGBA1C 5.90 12/27/2015   CBG 96   Assessment & Plan:   Rhett was seen today for  hypertension and diabetes.  Diagnoses and all orders for this visit:  Type 2 diabetes mellitus with complication, unspecified long term insulin use status (HCC) -     POCT glucose (manual entry) -     POCT glycosylated hemoglobin (Hb A1C) -     metFORMIN (GLUCOPHAGE) 500 MG tablet; Take 1 tablet (500 mg total) by mouth 2 (two) times daily with a meal. -     simvastatin (ZOCOR) 20 MG tablet; Take 1 tablet (20 mg total) by mouth at bedtime. -     Lipid Panel  Essential hypertension, benign -     lisinopril (PRINIVIL,ZESTRIL) 10 MG tablet; Take 1 tablet (10 mg total) by mouth daily.  Insomnia due to medical condition -     venlafaxine (EFFEXOR) 37.5 MG tablet; Take one pill daily in the morning for 3 days, then 1 pill twice daily  Perimenopausal -     venlafaxine (EFFEXOR) 37.5 MG tablet; Take one pill daily in the morning for 3 days, then 1 pill twice daily  Hyperlipidemia associated with type 2 diabetes mellitus ([place]) -     Lipid Panel    No orders of the defined types were placed in this encounter.   Follow-up: Return in about 6 weeks (around 10/25/2016) for insomnia .   Boykin Nearing MD

## 2016-09-13 NOTE — Assessment & Plan Note (Signed)
Controlled Continue current regimen Lipids today

## 2016-09-13 NOTE — Assessment & Plan Note (Signed)
Insomnia in setting of hot flashes Addressed sleep hygiene effexor 37.5 mg daily for 3 days then BID

## 2016-09-13 NOTE — Assessment & Plan Note (Signed)
Controlled  Slight intermittent cough Continue lisinopril 10 mg daily

## 2016-09-14 ENCOUNTER — Telehealth: Payer: Self-pay

## 2016-09-14 NOTE — Telephone Encounter (Signed)
-----   Message from Dessa PhiJosalyn Funches, MD sent at 09/14/2016  8:32 AM EDT ----- Cholesterol is in normal range Continue current regimen

## 2016-09-18 ENCOUNTER — Telehealth: Payer: Self-pay

## 2016-09-18 NOTE — Telephone Encounter (Signed)
HIPPA verified  [facility]

## 2016-09-18 NOTE — Telephone Encounter (Signed)
-----   Message from Yancey FlemingsKimberly R Becton, RN sent at 09/18/2016 12:05 PM EDT ----- Results

## 2016-09-19 ENCOUNTER — Telehealth: Payer: Self-pay

## 2016-09-19 NOTE — Telephone Encounter (Signed)
Pt was called and informed of lab results. VM was left for pt DPR is on file.

## 2016-10-29 MED FILL — LISINOPRIL 10 MG TABLET: 10 | 30 days supply | Qty: 30 | Fill #1

## 2016-10-29 MED FILL — metFORMIN HCL 500 MG TABS: 500 | 30 days supply | Qty: 60 | Fill #1

## 2016-10-29 MED FILL — SIMVASTATIN 20 MG TABLET: 20 | 30 days supply | Qty: 30 | Fill #1

## 2016-10-29 MED FILL — VENLAFAXINE HCL 37.5 MG TAB: 37.5 | 30 days supply | Qty: 60 | Fill #1

## 2016-11-08 ENCOUNTER — Ambulatory Visit: Payer: Medicare HMO | Attending: Family Medicine | Admitting: Family Medicine

## 2016-11-08 ENCOUNTER — Encounter: Payer: Self-pay | Admitting: Family Medicine

## 2016-11-08 VITALS — BP 128/79 | HR 78 | Temp 97.9°F | Ht 61.0 in | Wt 163.0 lb

## 2016-11-08 DIAGNOSIS — Z7984 Long term (current) use of oral hypoglycemic drugs: Secondary | ICD-10-CM | POA: Diagnosis not present

## 2016-11-08 DIAGNOSIS — G4701 Insomnia due to medical condition: Secondary | ICD-10-CM | POA: Diagnosis present

## 2016-11-08 DIAGNOSIS — Z780 Asymptomatic menopausal state: Secondary | ICD-10-CM | POA: Diagnosis not present

## 2016-11-08 DIAGNOSIS — N951 Menopausal and female climacteric states: Secondary | ICD-10-CM

## 2016-11-08 DIAGNOSIS — Z23 Encounter for immunization: Secondary | ICD-10-CM | POA: Diagnosis not present

## 2016-11-08 DIAGNOSIS — Z7982 Long term (current) use of aspirin: Secondary | ICD-10-CM | POA: Insufficient documentation

## 2016-11-08 DIAGNOSIS — E119 Type 2 diabetes mellitus without complications: Secondary | ICD-10-CM | POA: Diagnosis not present

## 2016-11-08 LAB — GLUCOSE, POCT (MANUAL RESULT ENTRY): POC Glucose: 124 mg/dl — AB (ref 70–99)

## 2016-11-08 MED ORDER — VENLAFAXINE HCL 75 MG PO TABS: 37.5000 mg | ORAL_TABLET | Freq: Two times a day (BID) | ORAL | 2 refills | Status: DC

## 2016-11-08 MED ORDER — DIPHENHYDRAMINE HCL 50 MG PO TABS: 25.0000 mg | ORAL_TABLET | Freq: Every evening | ORAL | 2 refills | Status: DC | PRN

## 2016-11-08 NOTE — Progress Notes (Signed)
Subjective:  Patient ID: [name] Barker, female    DOB: 1961-09-22  Age: 55 y.o. MRN: [id number]  CC: Insomnia   HPI [name] Barker presents for   1. Insomnia: for past four  months. Having trouble falling asleep. It takes her 1 hr to fall asleep. Hard time getting relaxed. Sleeping about 3-4 hrs a night. Tired when she wakes up in the morning. She does snore. She sleeps alone most of the time. Denies depression or anxious. Denies uncontrolled pain. She is having hot flashes at night. She has stress related to financial strain.    Social History  Substance Use Topics  . Smoking status: Never Smoker  . Smokeless tobacco: Never Used  . Alcohol use No    Outpatient Medications Prior to Visit  Medication Sig Dispense Refill  . aspirin 325 MG tablet Take 325 mg by mouth daily.      Marland Kitchen glucose monitoring kit (FREESTYLE) monitoring kit 1 each by Does not apply route as needed for other. 1 month Diabetic Testing Supplies for QAC-QHS accuchecks. 1 each 1  . lisinopril (PRINIVIL,ZESTRIL) 10 MG tablet Take 1 tablet (10 mg total) by mouth daily. 90 tablet 3  . metFORMIN (GLUCOPHAGE) 500 MG tablet Take 1 tablet (500 mg total) by mouth 2 (two) times daily with a meal. 180 tablet 3  . Multiple Vitamin (MULTIVITAMIN WITH MINERALS) TABS tablet Take 1 tablet by mouth daily.    . Omega-3 Fatty Acids (FISH OIL) 1000 MG CAPS Take 1 capsule by mouth every morning.     . simvastatin (ZOCOR) 20 MG tablet Take 1 tablet (20 mg total) by mouth at bedtime. 90 tablet 3  . tetrahydrozoline 0.05 % ophthalmic solution Place 2 drops into both eyes daily as needed (for dry eyes).    . venlafaxine (EFFEXOR) 37.5 MG tablet Take one pill daily in the morning for 3 days, then 1 pill twice daily 60 tablet 5   No facility-administered medications prior to visit.     ROS Review of Systems  Constitutional: Negative for chills and fever.  Eyes: Negative for visual disturbance.  Respiratory: Negative for shortness of  breath.   Cardiovascular: Negative for chest pain.  Gastrointestinal: Negative for abdominal pain and blood in stool.  Musculoskeletal: Negative for arthralgias and back pain.  Skin: Negative for rash.  Allergic/Immunologic: Negative for immunocompromised state.  Hematological: Negative for adenopathy. Does not bruise/bleed easily.  Psychiatric/Behavioral: Positive for sleep disturbance. Negative for dysphoric mood and suicidal ideas.    Objective:  BP 128/79 (BP Location: Left Arm, Patient Position: Sitting, Cuff Size: Small)   Pulse 78   Temp 97.9 F (36.6 C) (Oral)   Ht '5\' 1"'  (1.549 m)   Wt 163 lb (73.9 kg)   SpO2 100%   BMI 30.80 kg/m   BP/Weight 11/08/2016 06/21/4764 03/29/5034  Systolic BP 465 681 275  Diastolic BP 79 73 80  Wt. (Lbs) 163 161.8 174  BMI 30.8 30.57 32.89   Physical Exam  Constitutional: She is oriented to person, place, and time. She appears well-developed and well-nourished. No distress.  HENT:  Head: Normocephalic and atraumatic.  Nose: Mucosal edema present.  Cardiovascular: Normal rate, regular rhythm, normal heart sounds and intact distal pulses.   Pulmonary/Chest: Effort normal and breath sounds normal.  Musculoskeletal: She exhibits no edema.  Neurological: She is alert and oriented to person, place, and time.  Skin: Skin is warm and dry. No rash noted.  Psychiatric: She has a normal mood and affect.  Lab Results  Component Value Date   HGBA1C 5.9 09/13/2016   CBG 123  Depression screen [facility] 2/9 11/08/2016 09/13/2016 09/13/2016 02/28/2016 01/05/2016  Decreased Interest 0 1 0 3 3  Down, Depressed, Hopeless 0 0 0 0 0  PHQ - 2 Score 0 1 0 3 3  Altered sleeping 2 3 - 2 2  Tired, decreased energy 2 1 - 0 2  Change in appetite 0 0 - 2 1  Feeling bad or failure about yourself  0 0 - 0 0  Trouble concentrating 0 0 - 0 0  Moving slowly or fidgety/restless 0 0 - 0 0  Suicidal thoughts 0 0 - 0 0  PHQ-9 Score 4 5 - 7 8   GAD 7 : Generalized Anxiety  Score 11/08/2016 09/13/2016 02/28/2016 01/05/2016  Nervous, Anxious, on Edge 0 0 0 0  Control/stop worrying 2 0 1 0  Worry too much - different things 0 0 0 0  Trouble relaxing '1 3 2 3  ' Restless 0 0 2 0  Easily annoyed or irritable 0 2 0 3  Afraid - awful might happen 2 0 1 0  Total GAD 7 Score '5 5 6 6    ' Assessment & Plan:   Samarrah was seen today for insomnia.  Diagnoses and all orders for this visit:  Type 2 diabetes mellitus without complication, without long-term current use of insulin (HCC) -     POCT glucose (manual entry)  Insomnia due to medical condition -     venlafaxine (EFFEXOR) 75 MG tablet; Take 0.5 tablets (37.5 mg total) by mouth 2 (two) times daily with a meal. -     diphenhydrAMINE (BENADRYL) 50 MG tablet; Take 0.5-1 tablets (25-50 mg total) by mouth at bedtime as needed for sleep.  Perimenopausal -     venlafaxine (EFFEXOR) 75 MG tablet; Take 0.5 tablets (37.5 mg total) by mouth 2 (two) times daily with a meal.    No orders of the defined types were placed in this encounter.   Follow-up: No Follow-up on file.   Boykin Nearing MD

## 2016-11-08 NOTE — Assessment & Plan Note (Signed)
Slight improvement with effexor Still having trouble falling asleep  Plan: Add benadryl

## 2016-11-08 NOTE — Addendum Note (Signed)
Addended by: Ronette DeterFARRINGTON, Rulon Abdalla V on: 11/08/2016 09:52 AM   Modules accepted: Orders

## 2016-11-08 NOTE — Patient Instructions (Addendum)
[name] was seen today for insomnia.  Diagnoses and all orders for this visit:  Type 2 diabetes mellitus without complication, without long-term current use of insulin (HCC) -     POCT glucose (manual entry)  Insomnia due to medical condition -     venlafaxine (EFFEXOR) 75 MG tablet; Take 0.5 tablets (37.5 mg total) by mouth 2 (two) times daily with a meal. -     diphenhydrAMINE (BENADRYL) 50 MG tablet; Take 0.5-1 tablets (25-50 mg total) by mouth at bedtime as needed for sleep.  Perimenopausal -     venlafaxine (EFFEXOR) 75 MG tablet; Take 0.5 tablets (37.5 mg total) by mouth 2 (two) times daily with a meal.   F/u in 6-8 for insomnia  Dr. Armen PickupFunches

## 2016-12-20 ENCOUNTER — Encounter: Payer: Self-pay | Admitting: Family Medicine

## 2016-12-20 ENCOUNTER — Ambulatory Visit: Payer: Medicare HMO | Attending: Family Medicine | Admitting: Family Medicine

## 2016-12-20 VITALS — BP 138/80 | HR 82 | Temp 97.9°F | Ht 61.0 in | Wt 172.0 lb

## 2016-12-20 DIAGNOSIS — Z7984 Long term (current) use of oral hypoglycemic drugs: Secondary | ICD-10-CM | POA: Insufficient documentation

## 2016-12-20 DIAGNOSIS — G4700 Insomnia, unspecified: Secondary | ICD-10-CM | POA: Insufficient documentation

## 2016-12-20 DIAGNOSIS — Z780 Asymptomatic menopausal state: Secondary | ICD-10-CM | POA: Insufficient documentation

## 2016-12-20 DIAGNOSIS — E119 Type 2 diabetes mellitus without complications: Secondary | ICD-10-CM

## 2016-12-20 DIAGNOSIS — G4701 Insomnia due to medical condition: Secondary | ICD-10-CM | POA: Diagnosis not present

## 2016-12-20 DIAGNOSIS — Z7982 Long term (current) use of aspirin: Secondary | ICD-10-CM | POA: Insufficient documentation

## 2016-12-20 DIAGNOSIS — N951 Menopausal and female climacteric states: Secondary | ICD-10-CM | POA: Diagnosis not present

## 2016-12-20 DIAGNOSIS — Z23 Encounter for immunization: Secondary | ICD-10-CM | POA: Diagnosis not present

## 2016-12-20 DIAGNOSIS — Z79899 Other long term (current) drug therapy: Secondary | ICD-10-CM | POA: Insufficient documentation

## 2016-12-20 LAB — GLUCOSE, POCT (MANUAL RESULT ENTRY): POC GLUCOSE: 129 mg/dL — AB (ref 70–99)

## 2016-12-20 MED ORDER — VENLAFAXINE HCL 75 MG PO TABS: 75.0000 mg | ORAL_TABLET | Freq: Two times a day (BID) | ORAL | 2 refills | Status: DC

## 2016-12-20 MED FILL — VENLAFAXINE HCL 75 MG TAB: 75 | 30 days supply | Qty: 60 | Fill #0

## 2016-12-20 NOTE — Assessment & Plan Note (Signed)
Slight improvement on Effexor 75 mg BID Continue current dose

## 2016-12-20 NOTE — Patient Instructions (Addendum)
[name] was seen today for diabetes and insomnia.  Diagnoses and all orders for this visit:  Type 2 diabetes mellitus without complication, without long-term current use of insulin (HCC) -     POCT glucose (manual entry) -     Ambulatory referral to Ophthalmology  Insomnia due to medical condition -     venlafaxine (EFFEXOR) 75 MG tablet; Take 1 tablet (75 mg total) by mouth 2 (two) times daily with a meal.  Perimenopausal -     venlafaxine (EFFEXOR) 75 MG tablet; Take 1 tablet (75 mg total) by mouth 2 (two) times daily with a meal.   F/u in 3 months for diabetes   Dr. Armen PickupFunches   Menopause Menopause is the normal time of life when menstrual periods stop completely. Menopause is complete when you have missed 12 consecutive menstrual periods. It usually occurs between the ages of 48 years and 55 years. Very rarely does a woman develop menopause before the age of 40 years. At menopause, your ovaries stop producing the female hormones estrogen and progesterone. This can cause undesirable symptoms and also affect your health. Sometimes the symptoms may occur 4-5 years before the menopause begins. There is no relationship between menopause and:  Oral contraceptives.  Number of children you had.  Race.  The age your menstrual periods started (menarche). Heavy smokers and very thin women may develop menopause earlier in life. What are the causes?  The ovaries stop producing the female hormones estrogen and progesterone. Other causes include:  Surgery to remove both ovaries.  The ovaries stop functioning for no known reason.  Tumors of the pituitary gland in the brain.  Medical disease that affects the ovaries and hormone production.  Radiation treatment to the abdomen or pelvis.  Chemotherapy that affects the ovaries. What are the signs or symptoms?  Hot flashes.  Night sweats.  Decrease in sex drive.  Vaginal dryness and thinning of the vagina causing painful  intercourse.  Dryness of the skin and developing wrinkles.  Headaches.  Tiredness.  Irritability.  Memory problems.  Weight gain.  Bladder infections.  Hair growth of the face and chest.  Infertility. More serious symptoms include:  Loss of bone (osteoporosis) causing breaks (fractures).  Depression.  Hardening and narrowing of the arteries (atherosclerosis) causing heart attacks and strokes. How is this diagnosed?  When the menstrual periods have stopped for 12 straight months.  Physical exam.  Hormone studies of the blood. How is this treated? There are many treatment choices and nearly as many questions about them. The decisions to treat or not to treat menopausal changes is an individual choice made with your health care provider. Your health care provider can discuss the treatments with you. Together, you can decide which treatment will work best for you. Your treatment choices may include:  Hormone therapy (estrogen and progesterone).  Non-hormonal medicines.  Treating the individual symptoms with medicine (for example antidepressants for depression).  Herbal medicines that may help specific symptoms.  Counseling by a psychiatrist or psychologist.  Group therapy.  Lifestyle changes including:  Eating healthy.  Regular exercise.  Limiting caffeine and alcohol.  Stress management and meditation.  No treatment. Follow these instructions at home:  Take the medicine your health care provider gives you as directed.  Get plenty of sleep and rest.  Exercise regularly.  Eat a diet that contains calcium (good for the bones) and soy products (acts like estrogen hormone).  Avoid alcoholic beverages.  Do not smoke.  If you have  hot flashes, dress in layers.  Take supplements, calcium, and vitamin D to strengthen bones.  You can use over-the-counter lubricants or moisturizers for vaginal dryness.  Group therapy is sometimes very  helpful.  Acupuncture may be helpful in some cases. Contact a health care provider if:  You are not sure you are in menopause.  You are having menopausal symptoms and need advice and treatment.  You are still having menstrual periods after age 89 years.  You have pain with intercourse.  Menopause is complete (no menstrual period for 12 months) and you develop vaginal bleeding.  You need a referral to a specialist (gynecologist, psychiatrist, or psychologist) for treatment. Get help right away if:  You have severe depression.  You have excessive vaginal bleeding.  You fell and think you have a broken bone.  You have pain when you urinate.  You develop leg or chest pain.  You have a fast pounding heart beat (palpitations).  You have severe headaches.  You develop vision problems.  You feel a lump in your breast.  You have abdominal pain or severe indigestion. This information is not intended to replace advice given to you by your health care provider. Make sure you discuss any questions you have with your health care provider. Document Released: 03/01/2004 Document Revised: 05/17/2016 Document Reviewed: 07/09/2013 Elsevier Interactive Patient Education  2017 [employer]

## 2016-12-20 NOTE — Progress Notes (Signed)
Subjective:  Patient ID: [name] Barker, female    DOB: 1961-11-06  Age: 55 y.o. MRN: [id number]  CC: Diabetes   HPI Kendle Turbin presents for   1. Insomnia: for past four  months. Having trouble falling asleep. It takes her 30-45 minutes for her to  fall asleep. Hard time getting relaxed. Sleeping about 6  hrs a night. Tired when she wakes up in the morning. She does snore. She sleeps alone most of the time. Denies depression or anxious. Denies uncontrolled pain. She is having hot flashes at night. She has stress related to financial strain. Her LMP was 12/2015.   Social History  Substance Use Topics  . Smoking status: Never Smoker  . Smokeless tobacco: Never Used  . Alcohol use No    Outpatient Medications Prior to Visit  Medication Sig Dispense Refill  . aspirin 325 MG tablet Take 325 mg by mouth daily.      Marland Kitchen glucose monitoring kit (FREESTYLE) monitoring kit 1 each by Does not apply route as needed for other. 1 month Diabetic Testing Supplies for QAC-QHS accuchecks. 1 each 1  . lisinopril (PRINIVIL,ZESTRIL) 10 MG tablet Take 1 tablet (10 mg total) by mouth daily. 90 tablet 3  . metFORMIN (GLUCOPHAGE) 500 MG tablet Take 1 tablet (500 mg total) by mouth 2 (two) times daily with a meal. 180 tablet 3  . Multiple Vitamin (MULTIVITAMIN WITH MINERALS) TABS tablet Take 1 tablet by mouth daily.    . Omega-3 Fatty Acids (FISH OIL) 1000 MG CAPS Take 1 capsule by mouth every morning.     . simvastatin (ZOCOR) 20 MG tablet Take 1 tablet (20 mg total) by mouth at bedtime. 90 tablet 3  . tetrahydrozoline 0.05 % ophthalmic solution Place 2 drops into both eyes daily as needed (for dry eyes).    . venlafaxine (EFFEXOR) 75 MG tablet Take 0.5 tablets (37.5 mg total) by mouth 2 (two) times daily with a meal. 60 tablet 2  . diphenhydrAMINE (BENADRYL) 50 MG tablet Take 0.5-1 tablets (25-50 mg total) by mouth at bedtime as needed for sleep. (Patient not taking: Reported on 12/20/2016) 30 tablet 2    No facility-administered medications prior to visit.     ROS Review of Systems  Constitutional: Negative for chills and fever.  Eyes: Negative for visual disturbance.  Respiratory: Negative for shortness of breath.   Cardiovascular: Negative for chest pain.  Gastrointestinal: Negative for abdominal pain and blood in stool.  Musculoskeletal: Negative for arthralgias and back pain.  Skin: Negative for rash.  Allergic/Immunologic: Negative for immunocompromised state.  Hematological: Negative for adenopathy. Does not bruise/bleed easily.  Psychiatric/Behavioral: Positive for sleep disturbance. Negative for dysphoric mood and suicidal ideas.    Objective:  BP 138/80 (BP Location: Left Arm, Patient Position: Sitting, Cuff Size: Small)   Pulse 82   Temp 97.9 F (36.6 C) (Oral)   Ht 5' 1" (1.549 m)   Wt 172 lb (78 kg)   SpO2 94%   BMI 32.50 kg/m   BP/Weight 12/20/2016 11/08/2016 0/81/4481  Systolic BP 856 314 970  Diastolic BP 80 79 73  Wt. (Lbs) 172 163 161.8  BMI 32.5 30.8 30.57   Physical Exam  Constitutional: She is oriented to person, place, and time. She appears well-developed and well-nourished. No distress.  HENT:  Head: Normocephalic and atraumatic.  Nose: Mucosal edema present.  Cardiovascular: Normal rate, regular rhythm, normal heart sounds and intact distal pulses.   Pulmonary/Chest: Effort normal and breath sounds normal.  Musculoskeletal: She exhibits no edema.  Neurological: She is alert and oriented to person, place, and time.  Skin: Skin is warm and dry. No rash noted.  Psychiatric: She has a normal mood and affect.   Lab Results  Component Value Date   HGBA1C 5.9 09/13/2016   CBG 129  Depression screen [facility] 2/9 12/20/2016 11/08/2016 09/13/2016 09/13/2016 02/28/2016  Decreased Interest 0 0 1 0 3  Down, Depressed, Hopeless 0 0 0 0 0  PHQ - 2 Score 0 0 1 0 3  Altered sleeping 0 2 3 - 2  Tired, decreased energy _0 - 0  Change in appetite 0 0 0 - 2   Feeling bad or failure about yourself  0 0 0 - 0  Trouble concentrating 0 0 0 - 0  Moving slowly or fidgety/restless 0 0 0 - 0  Suicidal thoughts 0 0 0 - 0  PHQ-9 Score _1 - 7   GAD 7 : Generalized Anxiety Score 12/20/2016 11/08/2016 09/13/2016 02/28/2016  Nervous, Anxious, on Edge 0 0 0 0  Control/stop worrying 0 2 0 1  Worry too much - different things 0 0 0 0  Trouble relaxing _2 Restless 0 0 0 2  Easily annoyed or irritable 0 0 2 0  Afraid - awful might happen 2 2 0 1  Total GAD 7 Score _3 Assessment & Plan:   Consepcion was seen today for diabetes and insomnia.  Diagnoses and all orders for this visit:  Type 2 diabetes mellitus without complication, without long-term current use of insulin (HCC) -     POCT glucose (manual entry)  Insomnia due to medical condition -     venlafaxine (EFFEXOR) 75 MG tablet; Take 1 tablet (75 mg total) by mouth 2 (two) times daily with a meal.  Perimenopausal -     venlafaxine (EFFEXOR) 75 MG tablet; Take 1 tablet (75 mg total) by mouth 2 (two) times daily with a meal.    No orders of the defined types were placed in this encounter.   Follow-up: No Follow-up on file.   Boykin Nearing MD

## 2016-12-26 MED FILL — LISINOPRIL 10 MG TABLET: 10 | 30 days supply | Qty: 30 | Fill #2

## 2016-12-26 MED FILL — SIMVASTATIN 20 MG TABLET: 20 | 30 days supply | Qty: 30 | Fill #2

## 2016-12-26 MED FILL — metFORMIN HCL 500 MG TABS: 500 | 30 days supply | Qty: 60 | Fill #2

## 2017-02-05 DIAGNOSIS — E119 Type 2 diabetes mellitus without complications: Secondary | ICD-10-CM | POA: Diagnosis not present

## 2017-02-18 MED FILL — metFORMIN HCL 500 MG TABS: 500 | 30 days supply | Qty: 60 | Fill #3

## 2017-02-25 MED FILL — SIMVASTATIN 20 MG TABLET: 20 | 30 days supply | Qty: 30 | Fill #3

## 2017-02-25 MED FILL — LISINOPRIL 10 MG TABLET: 10 | 30 days supply | Qty: 30 | Fill #3

## 2017-02-25 MED FILL — VENLAFAXINE HCL 75 MG TAB: 75 | 30 days supply | Qty: 60 | Fill #1

## 2017-05-06 MED FILL — metFORMIN HCL 500 MG TABS: 500 | 30 days supply | Qty: 60 | Fill #4

## 2017-05-06 MED FILL — LISINOPRIL 10 MG TABLET: 10 | 30 days supply | Qty: 30 | Fill #4

## 2017-05-06 MED FILL — SIMVASTATIN 20 MG TABLET: 20 | 30 days supply | Qty: 30 | Fill #4

## 2017-05-08 ENCOUNTER — Encounter: Payer: Self-pay | Admitting: Family Medicine

## 2017-05-08 NOTE — Progress Notes (Signed)
Subjective:  Patient ID: [name] Barker, female    DOB: [birth year]  Age: 56 y.o. MRN: [id number]  CC: Diabetes and Hypertension   HPI [name] Barker presents for   1. Insomnia: for past 5 months. Having trouble falling asleep. Hard time getting relaxed. Sleeping about 3-4 hrs a night. Tired when she wakes up in the morning. She does snore. She sleeps alone most of the time. Denies depression or anxious. Denies uncontrolled pain. She is having hot flashes at night. She tried effexor 75 mg IR once daily reports it helped but she is not taking it now due to cost.   2. DM2: taking metformin. No GI upset.  No blurry vision, no polydipsia.  3. HTN: taking lisinopril 10 mg daily  No excessive cough. No CP or SOB. No leg swelling. Reports she recently moved and has been on low on funds and eating more sodium than usual.   Request SCAT: denies significant joint pains, weakness or trouble seeing.   Social History  Substance Use Topics  . Smoking status: Never Smoker  . Smokeless tobacco: Never Used  . Alcohol use No    Outpatient Medications Prior to Visit  Medication Sig Dispense Refill  . aspirin 325 MG tablet Take 325 mg by mouth daily.      . diphenhydrAMINE (BENADRYL) 50 MG tablet Take 0.5-1 tablets (25-50 mg total) by mouth at bedtime as needed for sleep. (Patient not taking: Reported on 12/20/2016) 30 tablet 2  . glucose monitoring kit (FREESTYLE) monitoring kit 1 each by Does not apply route as needed for other. 1 month Diabetic Testing Supplies for QAC-QHS accuchecks. 1 each 1  . lisinopril (PRINIVIL,ZESTRIL) 10 MG tablet Take 1 tablet (10 mg total) by mouth daily. 90 tablet 3  . metFORMIN (GLUCOPHAGE) 500 MG tablet Take 1 tablet (500 mg total) by mouth 2 (two) times daily with a meal. 180 tablet 3  . Multiple Vitamin (MULTIVITAMIN WITH MINERALS) TABS tablet Take 1 tablet by mouth daily.    . Omega-3 Fatty Acids (FISH OIL) 1000 MG CAPS Take 1 capsule by mouth every morning.     .  simvastatin (ZOCOR) 20 MG tablet Take 1 tablet (20 mg total) by mouth at bedtime. 90 tablet 3  . tetrahydrozoline 0.05 % ophthalmic solution Place 2 drops into both eyes daily as needed (for dry eyes).    . venlafaxine (EFFEXOR) 75 MG tablet Take 1 tablet (75 mg total) by mouth 2 (two) times daily with a meal. 60 tablet 2   No facility-administered medications prior to visit.     ROS Review of Systems  Constitutional: Negative for chills and fever.  Eyes: Negative for visual disturbance.  Respiratory: Negative for shortness of breath.   Cardiovascular: Negative for chest pain.  Gastrointestinal: Negative for abdominal pain and blood in stool.  Musculoskeletal: Negative for arthralgias and back pain.  Skin: Negative for rash.  Allergic/Immunologic: Negative for immunocompromised state.  Hematological: Negative for adenopathy. Does not bruise/bleed easily.  Psychiatric/Behavioral: Positive for sleep disturbance. Negative for dysphoric mood and suicidal ideas.    Objective:  BP 138/82   Pulse 95   Temp 98 F (36.7 C) (Oral)   Wt 165 lb 3.2 oz (74.9 kg)   SpO2 100%   BMI 31.21 kg/m   BP/Weight 05/09/2017 12/20/2016 93/71/6967  Systolic BP 893 810 175  Diastolic BP 82 80 79  Wt. (Lbs) 165.2 172 163  BMI 31.21 32.5 30.8   Physical Exam  Constitutional: She is oriented  to person, place, and time. She appears well-developed and well-nourished. No distress.  HENT:  Head: Normocephalic and atraumatic.  Nose: Mucosal edema present.  Cardiovascular: Normal rate, regular rhythm, normal heart sounds and intact distal pulses.   Pulmonary/Chest: Effort normal and breath sounds normal.  Musculoskeletal: She exhibits no edema.  Neurological: She is alert and oriented to person, place, and time.  Skin: Skin is warm and dry. No rash noted.  Psychiatric: She has a normal mood and affect.   Lab Results  Component Value Date   HGBA1C 6.1 05/09/2017   CBG 167   Assessment & Plan:    [name] Barker was seen today for diabetes and hypertension.  Diagnoses and all orders for this visit:  Type 2 diabetes mellitus without complication, without long-term current use of insulin (HCC) -     POCT glucose (manual entry) -     POCT glycosylated hemoglobin (Hb A1C)  Essential hypertension, benign -     CMP14+EGFR  Insomnia due to medical condition -     venlafaxine XR (EFFEXOR XR) 75 MG 24 hr capsule; Take 1 capsule (75 mg total) by mouth daily with breakfast.  Perimenopausal -     venlafaxine XR (EFFEXOR XR) 75 MG 24 hr capsule; Take 1 capsule (75 mg total) by mouth daily with breakfast.  Other orders -     simvastatin (ZOCOR) 20 MG tablet; Take 1 tablet (20 mg total) by mouth at bedtime. -     metFORMIN (GLUCOPHAGE) 500 MG tablet; Take 1 tablet (500 mg total) by mouth 2 (two) times daily with a meal.    No orders of the defined types were placed in this encounter.   Follow-up: Return in about 6 months (around 11/09/2017) for HTN and diabetes .   Boykin Nearing MD

## 2017-05-09 ENCOUNTER — Encounter: Payer: Self-pay | Admitting: Family Medicine

## 2017-05-09 ENCOUNTER — Ambulatory Visit: Payer: Medicare PPO | Attending: Family Medicine | Admitting: Family Medicine

## 2017-05-09 VITALS — BP 138/82 | HR 95 | Temp 98.0°F | Wt 165.2 lb

## 2017-05-09 DIAGNOSIS — E119 Type 2 diabetes mellitus without complications: Secondary | ICD-10-CM | POA: Diagnosis not present

## 2017-05-09 DIAGNOSIS — Z7984 Long term (current) use of oral hypoglycemic drugs: Secondary | ICD-10-CM | POA: Diagnosis not present

## 2017-05-09 DIAGNOSIS — Z7982 Long term (current) use of aspirin: Secondary | ICD-10-CM | POA: Insufficient documentation

## 2017-05-09 DIAGNOSIS — E1169 Type 2 diabetes mellitus with other specified complication: Secondary | ICD-10-CM

## 2017-05-09 DIAGNOSIS — N951 Menopausal and female climacteric states: Secondary | ICD-10-CM | POA: Diagnosis not present

## 2017-05-09 DIAGNOSIS — Z780 Asymptomatic menopausal state: Secondary | ICD-10-CM | POA: Insufficient documentation

## 2017-05-09 DIAGNOSIS — Z79899 Other long term (current) drug therapy: Secondary | ICD-10-CM | POA: Diagnosis not present

## 2017-05-09 DIAGNOSIS — E785 Hyperlipidemia, unspecified: Secondary | ICD-10-CM

## 2017-05-09 DIAGNOSIS — G4701 Insomnia due to medical condition: Secondary | ICD-10-CM | POA: Insufficient documentation

## 2017-05-09 DIAGNOSIS — I10 Essential (primary) hypertension: Secondary | ICD-10-CM | POA: Insufficient documentation

## 2017-05-09 LAB — GLUCOSE, POCT (MANUAL RESULT ENTRY): POC Glucose: 167 mg/dl — AB (ref 70–99)

## 2017-05-09 LAB — POCT GLYCOSYLATED HEMOGLOBIN (HGB A1C): Hemoglobin A1C: 6.1

## 2017-05-09 MED ORDER — VENLAFAXINE HCL ER 75 MG PO CP24: 75.0000 mg | ORAL_CAPSULE | Freq: Every day | ORAL | 5 refills | Status: DC

## 2017-05-09 MED ORDER — SIMVASTATIN 20 MG PO TABS: 20.0000 mg | ORAL_TABLET | Freq: Every day | ORAL | 3 refills | Status: DC

## 2017-05-09 MED ORDER — METFORMIN HCL 500 MG PO TABS: 500.0000 mg | ORAL_TABLET | Freq: Two times a day (BID) | ORAL | 3 refills | Status: DC

## 2017-05-09 NOTE — Assessment & Plan Note (Signed)
A: well controlled Continue metformin 500 mg BID

## 2017-05-09 NOTE — Assessment & Plan Note (Signed)
Advised Effexor 75 mg XR daily

## 2017-05-09 NOTE — Assessment & Plan Note (Signed)
A: well controlled Med: compliant P: Continue current lisinopril 10 mg daily

## 2017-05-09 NOTE — Patient Instructions (Addendum)
[name] Barker was seen today for diabetes and hypertension.  Diagnoses and all orders for this visit:  Type 2 diabetes mellitus without complication, without long-term current use of insulin (HCC) -     POCT glucose (manual entry) -     POCT glycosylated hemoglobin (Hb A1C)  Essential hypertension, benign -     CMP14+EGFR  Insomnia due to medical condition -     venlafaxine XR (EFFEXOR XR) 75 MG 24 hr capsule; Take 1 capsule (75 mg total) by mouth daily with breakfast.  Perimenopausal -     venlafaxine XR (EFFEXOR XR) 75 MG 24 hr capsule; Take 1 capsule (75 mg total) by mouth daily with breakfast.  Other orders -     simvastatin (ZOCOR) 20 MG tablet; Take 1 tablet (20 mg total) by mouth at bedtime. -     metFORMIN (GLUCOPHAGE) 500 MG tablet; Take 1 tablet (500 mg total) by mouth 2 (two) times daily with a meal.   F/u in 6 months for HTN and diabetes   Dr. Adrian Blackwater

## 2017-05-09 NOTE — Assessment & Plan Note (Signed)
Continue simvastatin 20 mg daily 

## 2017-05-10 LAB — CMP14+EGFR
ALK PHOS: 77 IU/L (ref 39–117)
ALT: 15 IU/L (ref 0–32)
AST: 15 IU/L (ref 0–40)
Albumin/Globulin Ratio: 1.6 (ref 1.2–2.2)
Albumin: 4.2 g/dL (ref 3.5–5.5)
BILIRUBIN TOTAL: 0.7 mg/dL (ref 0.0–1.2)
BUN/Creatinine Ratio: 12 (ref 9–23)
BUN: 12 mg/dL (ref 6–24)
CHLORIDE: 99 mmol/L (ref 96–106)
CO2: 25 mmol/L (ref 18–29)
CREATININE: 1.02 mg/dL — AB (ref 0.57–1.00)
Calcium: 9.6 mg/dL (ref 8.7–10.2)
GFR calc Af Amer: 71 mL/min/{1.73_m2} (ref >59)
GFR calc non Af Amer: 62 mL/min/{1.73_m2} (ref >59)
GLUCOSE: 129 mg/dL — AB (ref 65–99)
Globulin, Total: 2.6 g/dL (ref 1.5–4.5)
Potassium: 4 mmol/L (ref 3.5–5.2)
SODIUM: 141 mmol/L (ref 134–144)
Total Protein: 6.8 g/dL (ref 6.0–8.5)

## 2017-05-13 ENCOUNTER — Telehealth: Payer: Self-pay

## 2017-05-13 NOTE — Telephone Encounter (Signed)
Pt was called and informed of lab results. 

## 2017-06-24 ENCOUNTER — Other Ambulatory Visit: Payer: Self-pay | Admitting: Family Medicine

## 2017-06-24 DIAGNOSIS — N951 Menopausal and female climacteric states: Secondary | ICD-10-CM

## 2017-06-24 DIAGNOSIS — G4701 Insomnia due to medical condition: Secondary | ICD-10-CM

## 2017-06-24 MED FILL — metFORMIN HCL 500 MG TABS: 500 | 30 days supply | Qty: 60 | Fill #5

## 2017-06-24 MED FILL — VENLAFAXINE HCL 75 MG TAB: 75 | 30 days supply | Qty: 60 | Fill #2

## 2017-06-24 MED FILL — SIMVASTATIN 20 MG TABLET: 20 | 30 days supply | Qty: 30 | Fill #5

## 2017-08-21 ENCOUNTER — Other Ambulatory Visit: Payer: Self-pay | Admitting: Family Medicine

## 2017-08-21 DIAGNOSIS — G4701 Insomnia due to medical condition: Secondary | ICD-10-CM

## 2017-08-21 DIAGNOSIS — N951 Menopausal and female climacteric states: Secondary | ICD-10-CM

## 2017-08-21 MED ORDER — VENLAFAXINE HCL ER 75 MG PO CP24: 75.0000 mg | ORAL_CAPSULE | Freq: Every day | ORAL | 2 refills | Status: DC

## 2017-08-21 MED FILL — metFORMIN HCL 500 MG TABS: 500 | 30 days supply | Qty: 60 | Fill #6

## 2017-08-21 MED FILL — VENLAFAXINE HCL ER 75 MG CA: 75 | 30 days supply | Qty: 30 | Fill #0

## 2017-08-21 MED FILL — SIMVASTATIN 20 MG TABLET: 20 | 30 days supply | Qty: 30 | Fill #6

## 2017-08-22 ENCOUNTER — Encounter (HOSPITAL_COMMUNITY): Payer: Self-pay

## 2017-08-22 ENCOUNTER — Emergency Department (HOSPITAL_COMMUNITY)
Admission: EM | Admit: 2017-08-22 | Discharge: 2017-08-22 | Disposition: A | Discharge status: Home / Self Care | Payer: Medicare PPO | Attending: Emergency Medicine | Admitting: Emergency Medicine

## 2017-08-22 ENCOUNTER — Emergency Department (HOSPITAL_COMMUNITY): Payer: Medicare PPO

## 2017-08-22 ENCOUNTER — Other Ambulatory Visit: Payer: Self-pay | Admitting: Licensed Clinical Social Worker

## 2017-08-22 DIAGNOSIS — I10 Essential (primary) hypertension: Secondary | ICD-10-CM

## 2017-08-22 DIAGNOSIS — M25511 Pain in right shoulder: Secondary | ICD-10-CM

## 2017-08-22 DIAGNOSIS — E119 Type 2 diabetes mellitus without complications: Secondary | ICD-10-CM | POA: Diagnosis not present

## 2017-08-22 DIAGNOSIS — Z7984 Long term (current) use of oral hypoglycemic drugs: Secondary | ICD-10-CM | POA: Diagnosis not present

## 2017-08-22 DIAGNOSIS — S4991XA Unspecified injury of right shoulder and upper arm, initial encounter: Secondary | ICD-10-CM | POA: Diagnosis not present

## 2017-08-22 MED ORDER — LIDOCAINE 5 % EX PTCH: 1.0000 | MEDICATED_PATCH | CUTANEOUS | 0 refills | Status: DC

## 2017-08-22 MED ORDER — KETOROLAC TROMETHAMINE 60 MG/2ML IM SOLN
30.0000 mg | Freq: Once | INTRAMUSCULAR | Status: AC
  Administered 2017-08-22: 30 mg via INTRAMUSCULAR
  Filled 2017-08-22: qty 2

## 2017-08-22 MED ORDER — TIZANIDINE HCL 2 MG PO TABS: 2.0000 mg | ORAL_TABLET | Freq: Four times a day (QID) | ORAL | 0 refills | Status: DC | PRN

## 2017-08-22 NOTE — Patient Outreach (Addendum)
[facility]) Care Management  08/22/2017  [name] Barker May 11, 1961 [id number]  CSW was able to make initial contact with patient today to perform CSW screening with [facility] patients with an acuity level of 4 or 5. CSW introduced self, explained role and types of services provided through [facility] [facility] ([facility] [facility]). Patient was at ED with her sister. CSW further explained to patient that CSW wants to ensure that patient has all their needs met, prior to returning home. CSW obtained two HIPAA compliant identifiers from patient, which included patient's name and date of birth. Patient is accepting of [place] for: Nursing, Social Work and Pharmacy.   The following CSW screening was performed: The reason for patient's visit to the emergency department was due to acute pain of the right shoulder. She reports that her main community based support is her PCP. Patient reports that her PCP is Dr. Boykin Nearing at [facility]. She reports that her last appointment was in May of 2018 and that her next scheduled appointment is in November of 2018. She reports that if her pain continues then she will schedule an earlier PCP appointment for follow up. She declines needing to do so at this time. Patient reports that she has stable transportation through [facility] but is very interested in gaining SCAT as she has had difficulty getting on regular bus route. She shares that she has tried to apply in the past but "got lost in the system" and was never approved. Patient is interested in gaining a Education officer, museum with [facility] in order to get SCAT services. Patient reports that she is unable to fill her prescription after she leaves the ED because she does not get paid until 08/26/17. She reports that she has been having difficulty affording her medications for some time now and is interested in [place] to see if she  can get her medications at a cheaper price. Patient shares that she has both hypertension and diabetes and "does not know much about it" and is interested in gaining a nurse through [facility] to become more educated on both. Patient reports that she thought about going to Urgent Care but states "My doctor has already told me if my pain is really bad then I should go to the ED." Patient's sister reports that they were nervous that Urgent Care would cost more than going to the ED. Patient reports that she has no difficulties taking care of herself at home. She reports living with both her sister and brother.       CSW provided patient with a [facility] Welcome Packet, further explaining the program. Patient made aware that [facility] services does not interfere or replace home health or other community based services. Patient wishes to gain [place] [facility]. Consent was signed.   CSW will make referral to [facility] for Nursing, Social Work and Pharmacy.  [name], BSW, MSW, [email or web address] Phone: [phone] Fax: [phone]

## 2017-08-22 NOTE — ED Provider Notes (Signed)
[facility] DEPT Provider Note   CSN: [id number] Arrival date & time: 08/22/17  0819     History   Chief Complaint Chief Complaint  Patient presents with  . Shoulder Pain    HPI [name] Barker is a 56 y.o. female.  HPI   Pt with hx DM, HLD p/w gradually worsening right shoulder pain that began 1 week ago after lifting and carrying heavy groceries.  States the pain began in the lateral right shoulder and initially felt like soreness, over the week gradually worsened and tightened up.  Since yesterday the pain has been 9/10 and she has difficulty raising her arm.  The pain is exacerbated with movement and palpation.  Denies fevers, neck pain, chest pain, SOB, weakness or numbness in the arm.    Past Medical History:  Diagnosis Date  . Diabetes mellitus Dx 1998  . High cholesterol   . Hip pain   . Other and unspecified hyperlipidemia   . Personal history of unspecified circulatory disease   . S/P cardiac cath June 2008   abnormal cardiolite. LVH..question on prior studies and question septal hypertroph...not seen echo... EF 65%    Patient Active Problem List   Diagnosis Date Noted  . 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 Perimenopausal 09/13/2016  . Hyperlipidemia associated with type 2 diabetes mellitus ([place]) 07/22/2014  . Esophageal reflux 07/22/2014  . Diabetes ([place]) 04/22/2014  . Essential hypertension, benign 04/22/2014    Past Surgical History:  Procedure Laterality Date  . CHOLECYSTECTOMY      OB History    No data available       Home Medications    Prior to Admission medications   Medication Sig Start Date End Date Taking? Authorizing Provider  aspirin 325 MG tablet Take 325 mg by mouth daily.      [provider]  glucose monitoring kit (FREESTYLE) monitoring kit 1 each by Does not apply route as needed for other. 1 month Diabetic Testing Supplies for QAC-QHS accuchecks. 01/13/15   Lorayne Marek, MD  lidocaine (LIDODERM) 5 %  Place 1 patch onto the skin daily. Remove & Discard patch within 12 hours or as directed by MD 08/22/17   Clayton Bibles, PA-C  lisinopril (PRINIVIL,ZESTRIL) 10 MG tablet Take 1 tablet (10 mg total) by mouth daily. 09/13/16   Boykin Nearing, MD  metFORMIN (GLUCOPHAGE) 500 MG tablet Take 1 tablet (500 mg total) by mouth 2 (two) times daily with a meal. 05/09/17   Funches, Adriana Mccallum, MD  Multiple Vitamin (MULTIVITAMIN WITH MINERALS) TABS tablet Take 1 tablet by mouth daily.    [provider]  Omega-3 Fatty Acids (FISH OIL) 1000 MG CAPS Take 1 capsule by mouth every morning.     [provider]  simvastatin (ZOCOR) 20 MG tablet Take 1 tablet (20 mg total) by mouth at bedtime. 05/09/17   Funches, Adriana Mccallum, MD  tetrahydrozoline 0.05 % ophthalmic solution Place 2 drops into both eyes daily as needed (for dry eyes).    [provider]  tiZANidine (ZANAFLEX) 2 MG tablet Take 1 tablet (2 mg total) by mouth every 6 (six) hours as needed for muscle spasms. 08/22/17   Clayton Bibles, PA-C  venlafaxine XR (EFFEXOR XR) 75 MG 24 hr capsule Take 1 capsule (75 mg total) by mouth daily with breakfast. 08/21/17   Tresa Garter, MD    Family History Family History  Problem Relation Age of Onset  . Hypertension Mother   . Hypertension Father   .  Heart disease Father   . Stroke Other   . Diabetes Other   . Arthritis Other   . Hypertension Sister   . Hypertension Brother   . Colon cancer Neg Hx     Social History Social History  Substance Use Topics  . Smoking status: Never Smoker  . Smokeless tobacco: Never Used  . Alcohol use No     Allergies   Patient has no known allergies.   Review of Systems Review of Systems  Constitutional: Negative for chills and fever.  Respiratory: Negative for cough and shortness of breath.   Cardiovascular: Negative for chest pain.  Musculoskeletal: Positive for arthralgias.  Neurological: Negative for weakness and numbness.     Physical  Exam Updated Vital Signs BP 118/65 (BP Location: Left Arm)   Pulse 78   Temp 98.4 F (36.9 C) (Oral)   Resp 16   LMP 02/21/2016   SpO2 98%   Physical Exam  Constitutional: She appears well-developed and well-nourished. No distress.  HENT:  Head: Normocephalic and atraumatic.  Neck: Neck supple.  Pulmonary/Chest: Effort normal.  Musculoskeletal:  Right lateral shoulder, deltoid, with tenderness and edema.  Pain with active and passive ROM.  Pt unable to abduct to 90 degrees.  Distal sensation and pulses intact.    Neurological: She is alert.  Skin: She is not diaphoretic.  Nursing note and vitals reviewed.    ED Treatments / Results  Labs (all labs ordered are listed, but only abnormal results are displayed) Labs Reviewed - No data to display  EKG  EKG Interpretation None       Radiology Dg Shoulder Right  Result Date: 08/22/2017 CLINICAL DATA:  Right shoulder injury. EXAM: RIGHT SHOULDER - 2+ VIEW COMPARISON:  No recent prior. FINDINGS: Acromioclavicular glenohumeral degenerative change. No evidence of fracture or dislocation. Tiny loose body noted. Calcific density noted over supraspinatus tendon consistent calcific supraspinatus tendinosis . IMPRESSION: 1. No acute bony abnormality. No evidence of fracture or dislocation . 2.Degenerative changes right shoulder with small loose body. Calcific supraspinatus tendinosis. Electronically Signed   By: Marcello Moores  Register   On: 08/22/2017 09:06    Procedures Procedures (including critical care time)  Medications Ordered in ED Medications  ketorolac (TORADOL) injection 30 mg (30 mg Intramuscular Given 08/22/17 0951)     Initial Impression / Assessment and Plan / ED Course  I have reviewed the triage vital signs and the nursing notes.  Pertinent labs & imaging results that were available during my care of the patient were reviewed by me and considered in my medical decision making (see chart for details).     Afebrile,  nontoxic patient with injury to her right shoulder while lifting heavy groceries last week.   Xray tendon calcinosis and loose bony fragment.  No fracture, not noted as avulsion fracture.  Pt with reproducible tenderness and swelling over right deltoid.  Suspect muscle strain.   Neurovascularly intact.   D/C home with zanaflex, lidoderm patch, PCP follow up.  Discussed result, findings, treatment, and follow up  with patient.  Pt given return precautions.  Pt verbalizes understanding and agrees with plan.      Final Clinical Impressions(s) / ED Diagnoses   Final diagnoses:  Acute pain of right shoulder    New Prescriptions Discharge Medication List as of 08/22/2017  9:54 AM    START taking these medications   Details  lidocaine (LIDODERM) 5 % Place 1 patch onto the skin daily. Remove & Discard patch within 12  hours or as directed by MD, Starting Thu 08/22/2017, Print    tiZANidine (ZANAFLEX) 2 MG tablet Take 1 tablet (2 mg total) by mouth every 6 (six) hours as needed for muscle spasms., Starting Thu 08/22/2017, [address], [place], PA-C 08/22/17 1512    Quintella Reichert, MD 08/23/17 [phone]

## 2017-08-22 NOTE — Discharge Instructions (Signed)
Read the information below.  Use the prescribed medication as directed.  Please discuss all new medications with your pharmacist.  You may return to the Emergency Department at any time for worsening condition or any new symptoms that concern you.   If you develop uncontrolled pain, weakness or numbness of the extremity, severe discoloration of the skin, or you are unable to use your arm, return to the ER for a recheck.    °

## 2017-08-22 NOTE — Patient Outreach (Signed)
[facility]) Care Management  08/22/2017  [name] SliderShirley Barker 1961-03-31 [id number]  Assessment-CSW received new referral on patient for transportation assistance. CSW completed initial outreach attempt today. CSW unable to reach patient successfully. CSW left a HIPPA compliant voice message encouraging patient to return call once available in order to schedule home visit.   Plan-CSW will await return call or complete an additional outreach if needed.  Dickie LaBrooke [name] Barker, BSW, MSW, LCSW [facility] Care Management [name] Phone: [phone] Fax: [phone]

## 2017-08-22 NOTE — ED Triage Notes (Signed)
Pt states she woke up on Tuesday with right shoulder pain. Pt unsure if she lifted something too heavy but states she has pain and limited ROM to the shoulder. Pt was going to see her PCP but could not wait.

## 2017-08-22 NOTE — Patient Outreach (Signed)
[facility]) Care Management  08/22/2017  [name] Barker 21-Jun-1961 [id number]  Assessment- CSW received incoming return call from patient on 08/22/17. Patient provided HIPPA verifications. CSW previously met patient and sister today during CSW screening at ED but CSW spent time educating patient on [place] again. Patient is interested in gaining SCAT services as she tried to apply in the past and "got lost in the system" and never heard from them again. CSW scheduled home visit for 08/27/17 for 12:30.   Plan-CSW will complete home visit next week and complete SCAT application.  [name] Barker, BSW, MSW, [name] Phone: [phone] Fax: [phone]

## 2017-08-23 ENCOUNTER — Other Ambulatory Visit: Payer: Self-pay

## 2017-08-23 MED FILL — tiZANidine HCL 2 MG TABS: 2 | 3 days supply | Qty: 15 | Fill #0

## 2017-08-23 NOTE — Patient Outreach (Signed)
[facility]) Care Management  08/23/2017  [place] SliderShirley Barker 07/19/1961 [id number]   56 year old with history of diabetes,HTN, hyperlipidemia presented to the emergency room for right shoulder pain. RNCM called post emergency room visit.   Client states she has spoken with her provider office and will follow up next week. Client reports shoulder pain is better.  Client reports blood sugar 266 this morning. Client requesting more education/reinforcement of diabetes..  Plan: home visit scheduled.  Kathyrn SheriffJuana Demaryius Imran, RN, MSN, [facility] NorthBSN,CCM [facility] Community Care Coordinator Cell: [phone]

## 2017-08-27 ENCOUNTER — Other Ambulatory Visit: Payer: Self-pay | Admitting: Licensed Clinical Social Worker

## 2017-08-27 ENCOUNTER — Encounter: Payer: Self-pay | Admitting: Pharmacist

## 2017-08-27 NOTE — Patient Outreach (Signed)
[facility]) Care Management  08/27/2017  [place] SliderShirley Alderman [birth year] [id number]   Assessment- CSW arrived at patient's residence on 08/27/17 to complete scheduled home visit. CSW waited for 20 minutes but no on came to the door. CSW completed two calls to patient's residence but was not able to reach anyone. CSW left a HIPPA compliant voice message encouraging patient to return call once available in order to reschedule home visit.  Plan-CSW will await for return call from patient or complete additional outreach attempt within two weeks.  Dickie LaBrooke Maniya Donovan, BSW, MSW, LCSW [facility] Care Management [email or web address] Phone: [phone] Fax: [phone]

## 2017-08-27 NOTE — Progress Notes (Signed)
Prior authorization request received from pharmacy for lidocaine patches. This was prescribed in the emergency room. Request denied as it is only covered for neuropathic pain (cancer, diabetic, post-herpetic). Patient needs office visit for any new medications from us.

## 2017-08-28 ENCOUNTER — Other Ambulatory Visit: Payer: Self-pay | Admitting: Licensed Clinical Social Worker

## 2017-08-28 NOTE — Patient Outreach (Signed)
[facility]) Care Management  08/28/2017  [name] SliderShirley Barker 08/17/1961 [id number]  Assessment- CSW received incoming call from patient on 08/28/17. Patient provided HIPPA verifications. She apologized for missing scheduled appointment yesterday but explained that she was dealing with a financial emergency. Home visit was rescheduled for 09/03/17.  Plan-CSW will complete home visit next week.  Dickie LaBrooke Beckem Tomberlin, BSW, MSW, LCSW [facility] Care Management [email or web address] Phone: [phone] Fax: [phone]

## 2017-09-03 ENCOUNTER — Other Ambulatory Visit: Payer: Self-pay | Admitting: Pharmacist

## 2017-09-03 ENCOUNTER — Other Ambulatory Visit: Payer: Self-pay | Admitting: Licensed Clinical Social Worker

## 2017-09-03 MED FILL — LISINOPRIL 10 MG TABS: 10 | 30 days supply | Qty: 30 | Fill #5

## 2017-09-03 NOTE — Patient Outreach (Signed)
[facility]) Care Management  [facility] Social Work  09/03/2017  [name] Barker 1961/03/01 [id number]  Encounter Medications:  Outpatient Encounter Prescriptions as of 09/03/2017  Medication Sig  . aspirin 325 MG tablet Take 325 mg by mouth daily.    Marland Kitchen glucose monitoring kit (FREESTYLE) monitoring kit 1 each by Does not apply route as needed for other. 1 month Diabetic Testing Supplies for QAC-QHS accuchecks.  . lidocaine (LIDODERM) 5 % Place 1 patch onto the skin daily. Remove & Discard patch within 12 hours or as directed by MD  . lisinopril (PRINIVIL,ZESTRIL) 10 MG tablet Take 1 tablet (10 mg total) by mouth daily.  . metFORMIN (GLUCOPHAGE) 500 MG tablet Take 1 tablet (500 mg total) by mouth 2 (two) times daily with a meal.  . Multiple Vitamin (MULTIVITAMIN WITH MINERALS) TABS tablet Take 1 tablet by mouth daily.  . Omega-3 Fatty Acids (FISH OIL) 1000 MG CAPS Take 1 capsule by mouth every morning.   . simvastatin (ZOCOR) 20 MG tablet Take 1 tablet (20 mg total) by mouth at bedtime.  Marland Kitchen tetrahydrozoline 0.05 % ophthalmic solution Place 2 drops into both eyes daily as needed (for dry eyes).  Marland Kitchen tiZANidine (ZANAFLEX) 2 MG tablet Take 1 tablet (2 mg total) by mouth every 6 (six) hours as needed for muscle spasms.  Marland Kitchen venlafaxine XR (EFFEXOR XR) 75 MG 24 hr capsule Take 1 capsule (75 mg total) by mouth daily with breakfast.   No facility-administered encounter medications on file as of 09/03/2017.     Functional Status:  In your present state of health, do you have any difficulty performing the following activities: 09/03/2017  Hearing? N  Vision? Y  Difficulty concentrating or making decisions? N  Walking or climbing stairs? Y  Dressing or bathing? N  Doing errands, shopping? N  Some recent data might be hidden    Fall/Depression Screening:  PHQ 2/9 Scores 09/03/2017 05/09/2017 12/20/2016 11/08/2016 09/13/2016 09/13/2016 02/28/2016  PHQ - 2 Score 0 0 0 0 1 0 3  PHQ- 9 Score -  '1 2 4 5 ' - 7    Assessment: CSW successfully completed scheduled home visit on 09/03/17 at patient's residence. Both patient and her sister [name] Barker were present during appointment. Patient is in need of gaining stable transportation as she is unable to walk [place] regular [place] a mile away from her residence. CSW provided handout on available transportation resources and completed review of each resource. Patient is interested in gaining SCAT services. CSW completed entire SCAT application and will fax it to [place] office by 09/06/17. Patient was educated on the entire SCAT eligibility process. Patient reports that transportation is her only social work need at this time. CSW will monitor entire process to ensure that stable transportation is successfully gained. Patient denies wanting assistance with completing advance directives at this time. Patient denies experiencing any depressive symptoms and denies needing mental health resource information. Patient has a very strong support system that consist of her siblings. Patient was educated on available socialization opportunities within the community for seniors. Patient appreciative of resource review and visit today. Patient was reminded of scheduled home visit tomorrow with [facility]. Patient is interested in gaining more education around her diseases. CSW will follow up within two weeks.  [facility] CM Care Plan Problem One     Most Recent Value  Care Plan Problem One  Lack of stable transportation and community resource knowledge   Role Documenting  the Problem One  Clinical Social Worker  Care Plan for Problem One  Active  [facility] Long Term Goal   Patient will gain stable transportation within 90 days  [facility] Long Term Goal Start Date  08/22/17  Interventions for Problem One Long Term Goal  SCAT application was completed today. CSW will fax application to SCAT office     Plan: CSW will route encounter to PCP. CSW will complete next  outreach within two weeks.  [name], BSW, MSW, [email or web address] Phone: [phone] Fax: [phone]

## 2017-09-04 ENCOUNTER — Other Ambulatory Visit: Payer: Self-pay | Admitting: Licensed Clinical Social Worker

## 2017-09-04 ENCOUNTER — Encounter: Payer: Self-pay | Admitting: Pharmacist

## 2017-09-04 ENCOUNTER — Other Ambulatory Visit: Payer: Self-pay

## 2017-09-04 NOTE — Patient Outreach (Signed)
[place] [facility]) Care Management   09/04/2017  [name] Barker 07-12-1961 [id number]  [name] Barker is an 56 y.o. female  Subjective: client reports "I feel in between 5 and 10".    Objective:  BP 130/88   Pulse 75   Resp 20   Ht 1.549 m ('5\' 1"' ) Comment: patient reported.  Wt 165 lb (74.8 kg) Comment: patient reported.  LMP 02/21/2016   SpO2 97%   BMI 31.18 kg/m   Review of Systems  Respiratory:       Lung sounds clear bilaterally.  Cardiovascular:       Heart rate regular    Physical Exam  Encounter Medications:   Outpatient Encounter Prescriptions as of 09/04/2017  Medication Sig Note  . Multiple Vitamin (MULTIVITAMIN WITH MINERALS) TABS tablet Take 1 tablet by mouth daily.   . Omega-3 Fatty Acids (FISH OIL) 1000 MG CAPS Take 1 capsule by mouth daily.   Marland Kitchen aspirin 325 MG tablet Take 325 mg by mouth daily.     Marland Kitchen glucose monitoring kit (FREESTYLE) monitoring kit 1 each by Does not apply route as needed for other. 1 month Diabetic Testing Supplies for QAC-QHS accuchecks.   . lidocaine (LIDODERM) 5 % Place 1 patch onto the skin daily. Remove & Discard patch within 12 hours or as directed by MD (Patient not taking: Reported on 09/04/2017) 09/03/2017: [employer] would not approve  . lisinopril (PRINIVIL,ZESTRIL) 10 MG tablet Take 1 tablet (10 mg total) by mouth daily. (Patient not taking: Reported on 09/04/2017)   . metFORMIN (GLUCOPHAGE) 500 MG tablet Take 1 tablet (500 mg total) by mouth 2 (two) times daily with a meal. (Patient not taking: Reported on 09/04/2017)   . simvastatin (ZOCOR) 20 MG tablet Take 1 tablet (20 mg total) by mouth at bedtime. (Patient not taking: Reported on 09/04/2017)   . tetrahydrozoline 0.05 % ophthalmic solution Place 2 drops into both eyes daily as needed (for dry eyes).   Marland Kitchen tiZANidine (ZANAFLEX) 2 MG tablet Take 1 tablet (2 mg total) by mouth every 6 (six) hours as needed for muscle spasms. (Patient not taking: Reported on 09/04/2017)   .  venlafaxine XR (EFFEXOR XR) 75 MG 24 hr capsule Take 1 capsule (75 mg total) by mouth daily with breakfast. (Patient not taking: Reported on 09/04/2017)    No facility-administered encounter medications on file as of 09/04/2017.     Functional Status:   In your present state of health, do you have any difficulty performing the following activities: 09/04/2017 09/03/2017  Hearing? - N  Vision? - Y  Difficulty concentrating or making decisions? - N  Walking or climbing stairs? - Y  Dressing or bathing? - N  Doing errands, shopping? - N  Conservation officer, nature and eating ? N -  Using the Toilet? N -  In the past six months, have you accidently leaked urine? N -  Do you have problems with loss of bowel control? N -  Managing your Medications? N -  Managing your Finances? Y -  Comment difficulty paying for medications. -  Housekeeping or managing your Housekeeping? N -  Some recent data might be hidden    Fall/Depression Screening:    Fall Risk  09/03/2017 05/09/2017 12/20/2016  Falls in the past year? No No No   PHQ 2/9 Scores 09/03/2017 05/09/2017 12/20/2016 11/08/2016 09/13/2016 09/13/2016 02/28/2016  PHQ - 2 Score 0 0 0 0 1 0 3  PHQ- 9 Score - '1 2 4 5 ' - 7  Assessment: 56 year old with history of diabetes,HTN, hyperlipidemia, and right shoulder pain. RNCM completed home visit to assess care management needs. [place] social work, Curator following for social work needs.  Client reports she has not had her medications in approximately one month. But adds that she plans to pick [facility] her medications [facility]. [facility] pharmacist, Denyse Amass has been trying to contact client. RNCM encouraged [name] Barker to return call to [name] Barker. RNCM provided [name] Barker's contact number.   History of diabetes: client although states she checks her Blood sugar every day and that she usually checks it sometime after she eats, never prior to meals. However, CBG meter shows client has checked 6  times in the past 30 day. Blood sugar today 190 approximately one hour after eating. 14 day average 229 number=4;  30 day average 230 number=6. Client states she has not take her metformin in the past month. Client is agreeable to assistance with diabetes education/management.  Plan: follow up telephonically in 2 weeks and home visit next month. Update [facility] pharmacist.  [facility] CM Care Plan Problem One     Most Recent Value  Care Plan Problem One  increase blood sugar readings/reinforcement education related diabetes  Role Documenting the Problem One  Care Management [place] for Problem One  Active  [facility] Long Term Goal   client will verbalize at least three strategies for managing blood sugar within the next 31-45 days.  [facility] Long Term Goal Start Date  09/04/17  Interventions for Problem One Long Term Goal  RNCM provided [facility] calendar/organizer, provided diabetes book liveing well with diabetes,  provided elsevier article diabetes and nutrition, discussed importance of maintaining blood sugar levels.  [facility] CM Short Term Goal #1   client will check and record blood sugars before breakfast at least 3 times/week  [facility] CM Short Term Goal #1 Start Date  09/04/17  [facility] CM Short Term Goal #2   client will pick up medications within the week.  [facility] CM Short Term Goal #2 Start Date  09/04/17  Interventions for Short Term Goal #2  discussed importance of taking medications, encouraged client to seek financial counseling so that she will have the funds for her medications each month, provided contact number for [facility] pharmacist and encouraged her to return call to [facility] pharmacist.  [facility] CM Short Term Goal #3  client will verbalize contact with [facility] pharmacist within the next 30 days.  [facility] CM Short Term Goal #3 Start Date  09/04/17  Interventions for Short [place] Goal #3  provided contact number and encouraged client to return call [facility] pharmacist.      Thea Silversmith, RN, MSN, [place]  Coordinator Cell: [phone]

## 2017-09-04 NOTE — Patient Outreach (Addendum)
Late Entry for 09/03/2017   [place] [facility]) Care Management  [place]   09/04/2017  [name] Barker October 10, 1961 [id number]  Subjective: Patient was called regarding referral for medication assistance.  Patient is a 56 year old female with multiple medical conditions including but not limited to:  Type 2 diabetes, GERD, hypertension, and hyperlipidemia.  Patient reporting managing her medications on her own. She reported difficulty obtaining metformin,lisinopril, and simvastatin from her pharmacy.     Objective:   Encounter Medications: Outpatient Encounter Prescriptions as of 09/03/2017  Medication Sig Note  . aspirin 325 MG tablet Take 325 mg by mouth daily.     Marland Kitchen glucose monitoring kit (FREESTYLE) monitoring kit 1 each by Does not apply route as needed for other. 1 month Diabetic Testing Supplies for QAC-QHS accuchecks.   Marland Kitchen lisinopril (PRINIVIL,ZESTRIL) 10 MG tablet Take 1 tablet (10 mg total) by mouth daily. (Patient not taking: Reported on 09/04/2017)   . metFORMIN (GLUCOPHAGE) 500 MG tablet Take 1 tablet (500 mg total) by mouth 2 (two) times daily with a meal. (Patient not taking: Reported on 09/04/2017)   . Multiple Vitamin (MULTIVITAMIN WITH MINERALS) TABS tablet Take 1 tablet by mouth daily.   . simvastatin (ZOCOR) 20 MG tablet Take 1 tablet (20 mg total) by mouth at bedtime. (Patient not taking: Reported on 09/04/2017)   . tetrahydrozoline 0.05 % ophthalmic solution Place 2 drops into both eyes daily as needed (for dry eyes).   Marland Kitchen tiZANidine (ZANAFLEX) 2 MG tablet Take 1 tablet (2 mg total) by mouth every 6 (six) hours as needed for muscle spasms. (Patient not taking: Reported on 09/04/2017)   . venlafaxine XR (EFFEXOR XR) 75 MG 24 hr capsule Take 1 capsule (75 mg total) by mouth daily with breakfast. (Patient not taking: Reported on 09/04/2017)   . lidocaine (LIDODERM) 5 % Place 1 patch onto the skin daily. Remove & Discard patch within 12 hours or as directed  by MD (Patient not taking: Reported on 09/04/2017) 09/03/2017: [employer] would not approve  . [DISCONTINUED] Omega-3 Fatty Acids (FISH OIL) 1000 MG CAPS Take 1 capsule by mouth every morning.     No facility-administered encounter medications on file as of 09/03/2017.     Functional Status: In your present state of health, do you have any difficulty performing the following activities: 09/04/2017 09/03/2017  Hearing? - N  Vision? - Y  Difficulty concentrating or making decisions? - N  Walking or climbing stairs? - Y  Dressing or bathing? - N  Doing errands, shopping? - N  Conservation officer, nature and eating ? N -  Using the Toilet? N -  In the past six months, have you accidently leaked urine? N -  Do you have problems with loss of bowel control? N -  Managing your Medications? N -  Managing your Finances? Y -  Comment difficulty paying for medications. -  Housekeeping or managing your Housekeeping? N -  Some recent data might be hidden    Fall/Depression Screening: Fall Risk  09/03/2017 05/09/2017 12/20/2016  Falls in the past year? No No No   PHQ 2/9 Scores 09/03/2017 05/09/2017 12/20/2016 11/08/2016 09/13/2016 09/13/2016 02/28/2016  PHQ - 2 Score 0 0 0 0 1 0 3  PHQ- 9 Score - _0 - 7      Assessment:  Patient's medications were reviewed via telephone.     Drugs sorted by system:  Neurologic/Psychologic: Venlafaxine  Cardiovascular: Aspirin Lisinopril Simvastatin Omega 3 Fatty Acids  Endocrine: Metformin  Pain: Tizanidine   Vitamins/Minerals:  Infectious Diseases: Multiple Vitamin  Miscellaneous: OTC Eye drops (tetrahydrozoline 0.005%)  Medication Assistance Findings: Patient's pharmacy is [facility]).  Patient reported her physician left the [facility] and that she had been unable to access her medications.  [facility]. The technician said Metoprolol, Lisinopril, and  Simvastatin were ready for pick up and had been since 08/21/17.  Patient was called back. She was unaware the medications were ready for her and said she would pick them up 09/04/17. (Cost for all three prescriptions is $15)  Patient had a home visit with [place] Nurse, Thea Silversmith, RN.  Plan: Route note to PCP Follow up with the patient in 3-5 business days to see if she picked up her medications. Consider closing the pharmacy case.  Elayne Guerin, PharmD, [place] Clinical Pharmacist [phone]

## 2017-09-04 NOTE — Patient Outreach (Signed)
[facility]) Care Management  09/04/2017  Sheffield SliderShirley Farha [birth year] [id number]  Assessment- CSW successfully faxed SCAT application to SCAT office. CSW will monitor entire eligibility process to assist patient with gaining stable transportation.  Plan-CSW will follow up with patient within two weeks to ensure that SCAT assessment appointment was successfully scheduled.  Dickie LaBrooke Syria Kestner, BSW, MSW, LCSW [facility] Care Management [email or web address] Phone: [phone] Fax: [phone]

## 2017-09-11 ENCOUNTER — Other Ambulatory Visit: Payer: Self-pay | Admitting: Licensed Clinical Social Worker

## 2017-09-11 NOTE — Patient Outreach (Signed)
[facility]) Care Management  09/11/2017  [name] Barker 1961/07/24 [id number]  Assessment- CSW completed outreach call to patient to follow up on transportation needs. Patient answered and provided HIPPA verifications. CSW questioned if patient had heard from SCAT yet. Patient confirms that SCAT contacted her and that she scheduled assessment appointment on 09/24/17. CSW encouraged patient to write reminders down for appointment. Patient agreeable to do so. Patient appreciative of call and follow up.   Plan-CSW will follow up in 30 days and will continue to provide social work assistance and support.  Dickie La, BSW, MSW, LCSW [email or web address] Phone: [phone] Fax: [phone]

## 2017-09-12 ENCOUNTER — Telehealth: Payer: Self-pay | Admitting: Pharmacist

## 2017-09-12 NOTE — Patient Outreach (Signed)
[facility]) Care Management  09/12/2017  Shelva Hetzer [birth year] [id number]   Called patient to follow up on medication adherence and her ability to pick up her medications. No answer. HIPAA compliant message left on the patient's voicemail.  Plan:  Call patient back in 5-7 business days.   Beecher Mcardle, PharmD, BCACP [facility] Clinical Pharmacist [phone]

## 2017-09-16 ENCOUNTER — Other Ambulatory Visit: Payer: Self-pay

## 2017-09-16 NOTE — Patient Outreach (Signed)
[facility] Care Management  09/16/2017  [name] Barker 08/31/1961 [id number]  Subjective: "I picked up my main medicines".  Objective: none  Assessment: 56 year old with history of diabetes,HTN, hyperlipidemia, and right shoulder pain. RNCM completed home visit to assess care management needs. [facility] social work, Nurse, learning disability following for social work needs.  Client reports she has been checking her blood sugar, but continues to check blood sugar after eating. She states she has not checked her blood sugar today, but it was 177 yesterday after eating. RNCM informed client that a blood sugar reading 1-2 hours after eating should be less than 180. Provided positive feedback for that reading. RNCM encouraged client to take some readings prior to eating.  Initially client stated that she has not gotten her medications, that she will pick them up at the beginning of next month after she gets her check. Then client stated she went to the pharmacy to pick up her main medicines. client also stated she is checking her blood sugar about every other day to save on strips. RNCM encouraged client to return call to [facility] Pharmacist. Lilian Kapur.  Plan: home visit next week to continue education regarding diabetes management.  Kathyrn Sheriff, RN, MSN, [facility] Community Care Coordinator Cell: [phone]

## 2017-09-23 ENCOUNTER — Other Ambulatory Visit: Payer: Self-pay | Admitting: Pharmacist

## 2017-09-23 NOTE — Patient Outreach (Signed)
[facility]) Care Management  09/23/2017  [name] Barker 1961-06-04 [id number]   Called patient to follow up with her on medication assistance. HIPAA identifiers were obtained. Patient reported she was doing well and had all of her medications. However, review of the notes from [facility] Nursing notes state the patient did not have all of her medications. Patient confirmed again today on the phone that she picked up the medications I helped her get earlier this month but does not need anything else right now.    I spoke with [facility] Nurse, Kathyrn Sheriff who expressed concerns over the patient's medication compliance despite the patient saying she is taking her medications as prescribed.    I will complete a dual home visit with patient on 10/02/2017 to further investigate.   Beecher Mcardle, PharmD, BCACP [facility] Clinical Pharmacist [phone]

## 2017-09-26 ENCOUNTER — Other Ambulatory Visit: Payer: Self-pay | Admitting: Licensed Clinical Social Worker

## 2017-09-26 NOTE — Patient Outreach (Signed)
[facility]) Care Management  09/26/2017  [name] Barker 04-Mar-1961 [id number]  Assessment-CSW completed outreach attempt today to follow up on SCAT application status. CSW unable to reach patient successfully. CSW left a HIPPA compliant voice message encouraging patient to return call once available.  Plan-CSW will await return call or complete an additional outreach if needed.   Dickie La, BSW, MSW, LCSW [email or web address] Phone: [phone] Fax: [phone]

## 2017-10-01 ENCOUNTER — Other Ambulatory Visit: Payer: Self-pay | Admitting: Licensed Clinical Social Worker

## 2017-10-01 NOTE — Patient Outreach (Signed)
[facility] [facility]) Care Management  10/01/2017  Ted Leonhart 10-May-1961 [id number]  Assessment- CSW completed call to patient and she answered. She reports that she went to SCAT assessment appointment last week and was approved for services. She reports that she was provided a SCAT ID badge and has the reservation line number on hand so that she can schedule future rides. She reports that she has not scheduled a ride yet but plans to do so this week. Patient was provided education on available community resources that would improve her socialization and activity. She reports that she has been experiencing some weakness over the last two weeks and that her legs were hurting yesterday. Patient is unsure if the weather is causing her arthritis to flare up. She reports having a medical appointment next month but will schedule a sooner one if her pain continues. Patient was appreciative of call and community resource review.  [facility] CM Care Plan Problem One     Most Recent Value  Care Plan Problem One  Lack of stable transportation and community resource knowledge   Role Documenting the Problem One  Clinical Social Worker  Care Plan for Problem One  Active  [facility] Long Term Goal   Patient will gain stable transportation within 90 days  [facility] Long Term Goal Start Date  08/22/17  [facility] Long Term Goal Met Date  10/01/17  Interventions for Problem One Long Term Goal  GOAL MET. Patient not has stable transportation through [place] Short Term Goal #1   Patient will be able to read back 3 community resources within 30 days to help improve her socialization, activity and health  [facility] CM Short Term Goal #1 Start Date  10/01/17  Interventions for Short Term Goal #1  CSW provided education on community resources such as [employer], [place], [place]. CSW will continue to complete review of all resources during each outreach call to help familiarize patient with helpful  resources     Plan-CSW will follow up within 30 days and continue to provide social work assistance and support.  [name], BSW, MSW, [email or web address] Phone: [phone] Fax: [phone]

## 2017-10-02 ENCOUNTER — Ambulatory Visit: Payer: Self-pay

## 2017-10-02 ENCOUNTER — Ambulatory Visit: Payer: Self-pay | Admitting: Pharmacist

## 2017-10-02 ENCOUNTER — Other Ambulatory Visit: Payer: Self-pay

## 2017-10-02 ENCOUNTER — Other Ambulatory Visit: Payer: Self-pay | Admitting: Pharmacist

## 2017-10-02 NOTE — Patient Outreach (Signed)
[facility]) Care Management  10/02/2017  [name] Barker 17-Jan-1961 [id number]   Patient was called prior to our scheduled home visit to make sure we were still on and to let the patient know I may be a few minutes behind. HIPAA identifiers were obtained. Patient said she had forgotten about our appointment as well as her appointment with Kathyrn Sheriff at the same time. Patient said she would need to reschedule.  Kathyrn Sheriff was called to alert her as well.  Plan:  Call patient back to reschedule her appointment.  Beecher Mcardle, PharmD, BCACP [facility] Clinical Pharmacist [phone]

## 2017-10-02 NOTE — Patient Outreach (Signed)
[facility]) Care Management  10/02/2017  Alfredo Collymore 19-Dec-1961 [id number]   Care Coordination: RNCM received call from K. Leavy Cella, pharmacist regarding home visit planned for today. Mrs. Leavy Cella reports she has spoken with client today and Ms. Trickey states she will not be available for scheduled appointment today.  Plan: RNCM will follow up to reschedule within the next week.  Kathyrn Sheriff, RN, MSN, [facility] Community Care Coordinator Cell: [phone]

## 2017-10-03 ENCOUNTER — Other Ambulatory Visit: Payer: Self-pay | Admitting: Licensed Clinical Social Worker

## 2017-10-03 NOTE — Patient Outreach (Addendum)
[facility]) Care Management  10/03/2017  Junette Bernat [birth year] [id number]  Assessment- CSW completed outreach call to patient. CSW had a cancellation appointment and wanted to see if CSW could complete home visit today. Patient is agreeable. CSW will complete home visit today on 10/03/17.  Plan-CSW will complete home visit today and continue to provide social work assistance and support.  Dickie La, BSW, MSW, LCSW [facility] [email or web address] Phone: [phone] Fax: [phone]

## 2017-10-03 NOTE — Patient Outreach (Signed)
[facility]) Care Management  [facility] Social Work  10/03/2017  [name] Barker 1961-03-10 [id number]  Encounter Medications:  Outpatient Encounter Prescriptions as of 10/03/2017  Medication Sig Note  . aspirin 325 MG tablet Take 325 mg by mouth daily.   09/16/2017: States taking every other day  . glucose monitoring kit (FREESTYLE) monitoring kit 1 each by Does not apply route as needed for other. 1 month Diabetic Testing Supplies for QAC-QHS accuchecks.   . lidocaine (LIDODERM) 5 % Place 1 patch onto the skin daily. Remove & Discard patch within 12 hours or as directed by MD (Patient not taking: Reported on 09/04/2017) 09/03/2017: [employer] would not approve  . lisinopril (PRINIVIL,ZESTRIL) 10 MG tablet Take 1 tablet (10 mg total) by mouth daily.   . metFORMIN (GLUCOPHAGE) 500 MG tablet Take 1 tablet (500 mg total) by mouth 2 (two) times daily with a meal.   . Multiple Vitamin (MULTIVITAMIN WITH MINERALS) TABS tablet Take 1 tablet by mouth daily.   . Omega-3 Fatty Acids (FISH OIL) 1000 MG CAPS Take 1 capsule by mouth daily.   . simvastatin (ZOCOR) 20 MG tablet Take 1 tablet (20 mg total) by mouth at bedtime.   Marland Kitchen tetrahydrozoline 0.05 % ophthalmic solution Place 2 drops into both eyes daily as needed (for dry eyes).   Marland Kitchen tiZANidine (ZANAFLEX) 2 MG tablet Take 1 tablet (2 mg total) by mouth every 6 (six) hours as needed for muscle spasms. (Patient not taking: Reported on 09/04/2017)   . venlafaxine XR (EFFEXOR XR) 75 MG 24 hr capsule Take 1 capsule (75 mg total) by mouth daily with breakfast. (Patient not taking: Reported on 09/04/2017)    No facility-administered encounter medications on file as of 10/03/2017.     Functional Status:  In your present state of health, do you have any difficulty performing the following activities: 09/04/2017 09/03/2017  Hearing? - N  Vision? - Y  Difficulty concentrating or making decisions? - N  Walking or climbing stairs? - Y  Dressing or  bathing? - N  Doing errands, shopping? - N  Conservation officer, nature and eating ? N -  Using the Toilet? N -  In the past six months, have you accidently leaked urine? N -  Do you have problems with loss of bowel control? N -  Managing your Medications? N -  Managing your Finances? Y -  Comment difficulty paying for medications. -  Housekeeping or managing your Housekeeping? N -  Some recent data might be hidden    Fall/Depression Screening:  PHQ 2/9 Scores 10/03/2017 09/03/2017 05/09/2017 12/20/2016 11/08/2016 09/13/2016 09/13/2016  PHQ - 2 Score 0 0 0 0 0 1 0  PHQ- 9 Score - - _0 -    Assessment: CSW completed monthly routine home visit on 10/03/17. Patient and her sister were present during session. Patient reports that she has already used [facility] twice since receiving services. She reports that she went to [facility] by [facility] in order to pick up her Reli On Prime blood glucose test strips but they were not available. CSW encouraged her to go to another store tomorrow to get them. Patient denies being in any pain or discomfort but has complained of leg pain due to poor circulation. Patient has agreed to start elevating her legs at night which she often forgets to do. Patient reports that she is only getting 2-4 hours of sleep per night. Patient use to be on trazadone but was does not wish to take  medication. Patient admits to having racing thoughts at night due to increased stress. Patient reports that does not nap during the day. CSW provided education on appropriate self care methods for her to implement. Patient denies wanting to gain mental health resources at this time. CSW suggested talking to her doctor about getting melatonin. Patient reports that she will try this. CSW provided handout on maintaining self care. Patient reports that most her her stress is caused by "Worrying about paying all of my bills." Patient does not receive Medicaid but has been encouraged to do so. Patient receives $15.00 in  food stamps once per month. CSW discussed available financial resources within [facility] Patient reports that she needs new eyeglasses but cannot afford them. CSW educated patient on the [employer] which will cost her $15 for the eye exam and $25 for the prescription glasses. Patient is interested in program. CSW successfully made referral to program and CSW was informed that the wait list is currently out until February of 2019. CSW provided [employer] number to patient's sister as she is interested in this program as well. Patient was not able to complete home visit with [facility] RNCM and Pharmacist yesterday. CSW encouraged patient to contact them in order to reschedule appointment. Contact information was provided.  [facility] CM Care Plan Problem One     Most Recent Value  Care Plan Problem One  Lack of stable transportation and community resource knowledge   Role Documenting the Problem One  Clinical Social Worker  Care Plan for Problem One  Active  [facility] Long Term Goal   Patient will gain stable transportation within 90 days  [facility] Long Term Goal Start Date  08/22/17  [facility] Long Term Goal Met Date  10/01/17  Interventions for Problem One Long Term Goal  GOAL MET. Patient not has stable transportation through [place] Short Term Goal #1   Patient will be able to read back 3 community resources within 30 days to help improve her socialization, activity and health  [facility] CM Short Term Goal #1 Start Date  10/01/17  Interventions for Short Term Goal #1  CSW provided education on community resources such as [employer], [place], [place]. CSW will continue to complete review of all resources during each outreach call to help familiarize patient with helpful resources  [facility] CM Short Term Goal #2   Patient will implement 3 self care methods into her routine over the next 30 days day to increased stress and little to no sleep  [facility] CM Short Term Goal #2 Start Date   10/03/17  Interventions for Short Term Goal #2  CSW completed home visit and provided counseling interventions during session. CSW provided education on important self care tools. Patient has agreed to get melatonin to assist her with her racing thoughts at night and inability to stay asleep.     Plan: CSW will follow up next month and continue to provide social work support.  [name], BSW, MSW, [email or web address] Phone: [phone] Fax: [phone]

## 2017-10-09 ENCOUNTER — Other Ambulatory Visit: Payer: Self-pay

## 2017-10-09 NOTE — Patient Outreach (Signed)
[facility]) Care Management  10/09/2017  [name] SliderShirley Barker Nov 04, 1961 [id number]   Subjective: none  Objective: none  Assessment: 56 year old with history of diabetes, HTN, hyperlipidemia, andright shoulder pain.   [facility] last week. RNCM called to assessment care management needs and rescheduled home visit. No answer. HIPPA compliant message left.   Plan: follow up next week if no return call.  Kathyrn SheriffJuana Yao Hyppolite, RN, MSN, [facility],CCM [facility] Community Care Coordinator Cell: [phone]

## 2017-10-10 ENCOUNTER — Other Ambulatory Visit: Payer: Self-pay | Admitting: Pharmacist

## 2017-10-10 NOTE — Patient Outreach (Signed)
[facility]) Care Management  10/10/2017  [name] SliderShirley Barker [birth year] [id number]   Patient was called to follow up and to reschedule a home visit. Unfortunately, the patient did not answer her phone. HIPAA compliant message was left on her voicemail.  Plan:  Follow up with patient in 5-7 business days.   [name], PharmD, BCACP [facility] Clinical Pharmacist [phone]

## 2017-10-14 ENCOUNTER — Other Ambulatory Visit: Payer: Self-pay

## 2017-10-14 NOTE — Patient Outreach (Signed)
[facility]) Care Management  10/14/2017  [name] Barker 1960/12/29 [id number]   Subjective: client reports she has been having trouble getting her medication, she reports she is considering mail order through her insurance company [employer].  Objective: none  Assessment: 56 year old with history of diabetes, HTN, hyperlipidemia, andright shoulder pain.   RNCM called to follow up. RNCM has had difficulty reaching client. She reports that she does not have he glucose meter strips and therefore has not been checking her blood sugar. She also states that it is inconvenient at times to physically go to her pharmacy to obtain her medications, and is looking into the option of mail order. [name] Barker reports she has an appointment scheduled with her provider this week. RNCM encouraged client to attend scheduled visit.  Plan: [facility], for co-visit next week.  [name] SheriffJuana Yashvi Jasinski, RN, MSN, [facility],CCM [facility] Community Care Coordinator Cell: [phone]

## 2017-10-15 ENCOUNTER — Other Ambulatory Visit: Payer: Self-pay | Admitting: Pharmacist

## 2017-10-15 DIAGNOSIS — I10 Essential (primary) hypertension: Secondary | ICD-10-CM

## 2017-10-15 DIAGNOSIS — N951 Menopausal and female climacteric states: Secondary | ICD-10-CM

## 2017-10-15 DIAGNOSIS — G4701 Insomnia due to medical condition: Secondary | ICD-10-CM

## 2017-10-15 MED ORDER — LISINOPRIL 10 MG PO TABS: 10.0000 mg | ORAL_TABLET | Freq: Every day | ORAL | 0 refills | Status: DC

## 2017-10-15 MED ORDER — METFORMIN HCL 500 MG PO TABS: 500.0000 mg | ORAL_TABLET | Freq: Two times a day (BID) | ORAL | 0 refills | Status: DC

## 2017-10-15 MED ORDER — ACCU-CHEK NANO SMARTVIEW W/DEVICE KIT: PACK | 0 refills | Status: DC

## 2017-10-15 MED ORDER — TIZANIDINE HCL 2 MG PO TABS: 2.0000 mg | ORAL_TABLET | Freq: Four times a day (QID) | ORAL | 0 refills | Status: DC | PRN

## 2017-10-15 MED ORDER — VENLAFAXINE HCL ER 75 MG PO CP24: 75.0000 mg | ORAL_CAPSULE | Freq: Every day | ORAL | 0 refills | Status: DC

## 2017-10-15 MED ORDER — SIMVASTATIN 20 MG PO TABS: 20.0000 mg | ORAL_TABLET | Freq: Every day | ORAL | 0 refills | Status: DC

## 2017-10-16 ENCOUNTER — Ambulatory Visit: Payer: Self-pay | Admitting: Pharmacist

## 2017-10-17 ENCOUNTER — Encounter: Payer: Self-pay | Admitting: Physician Assistant

## 2017-10-17 ENCOUNTER — Ambulatory Visit: Payer: Medicare PPO | Attending: Internal Medicine | Admitting: Physician Assistant

## 2017-10-17 VITALS — BP 147/80 | HR 89 | Temp 98.1°F | Resp 18 | Ht 61.0 in | Wt 169.0 lb

## 2017-10-17 DIAGNOSIS — Z7982 Long term (current) use of aspirin: Secondary | ICD-10-CM | POA: Diagnosis not present

## 2017-10-17 DIAGNOSIS — N951 Menopausal and female climacteric states: Secondary | ICD-10-CM

## 2017-10-17 DIAGNOSIS — G4701 Insomnia due to medical condition: Secondary | ICD-10-CM | POA: Diagnosis not present

## 2017-10-17 DIAGNOSIS — Z760 Encounter for issue of repeat prescription: Secondary | ICD-10-CM | POA: Diagnosis not present

## 2017-10-17 DIAGNOSIS — Z780 Asymptomatic menopausal state: Secondary | ICD-10-CM | POA: Diagnosis not present

## 2017-10-17 DIAGNOSIS — Z79899 Other long term (current) drug therapy: Secondary | ICD-10-CM | POA: Insufficient documentation

## 2017-10-17 DIAGNOSIS — E119 Type 2 diabetes mellitus without complications: Secondary | ICD-10-CM | POA: Diagnosis not present

## 2017-10-17 DIAGNOSIS — Z7984 Long term (current) use of oral hypoglycemic drugs: Secondary | ICD-10-CM | POA: Diagnosis not present

## 2017-10-17 DIAGNOSIS — J40 Bronchitis, not specified as acute or chronic: Secondary | ICD-10-CM | POA: Diagnosis not present

## 2017-10-17 DIAGNOSIS — E785 Hyperlipidemia, unspecified: Secondary | ICD-10-CM

## 2017-10-17 DIAGNOSIS — R05 Cough: Secondary | ICD-10-CM | POA: Insufficient documentation

## 2017-10-17 DIAGNOSIS — Z8679 Personal history of other diseases of the circulatory system: Secondary | ICD-10-CM | POA: Diagnosis not present

## 2017-10-17 DIAGNOSIS — E7800 Pure hypercholesterolemia, unspecified: Secondary | ICD-10-CM | POA: Diagnosis not present

## 2017-10-17 DIAGNOSIS — I10 Essential (primary) hypertension: Secondary | ICD-10-CM

## 2017-10-17 LAB — GLUCOSE, POCT (MANUAL RESULT ENTRY): POC Glucose: 134 mg/dl — AB (ref 70–99)

## 2017-10-17 LAB — POCT GLYCOSYLATED HEMOGLOBIN (HGB A1C): HEMOGLOBIN A1C: 6.5

## 2017-10-17 MED ORDER — SIMVASTATIN 20 MG PO TABS: 20.0000 mg | ORAL_TABLET | Freq: Every day | ORAL | 0 refills | Status: DC

## 2017-10-17 MED ORDER — SIMVASTATIN 20 MG PO TABS: 20.0000 mg | ORAL_TABLET | Freq: Every day | ORAL | 3 refills | Status: DC

## 2017-10-17 MED ORDER — LISINOPRIL 10 MG PO TABS: 10.0000 mg | ORAL_TABLET | Freq: Every day | ORAL | 3 refills | Status: DC

## 2017-10-17 MED ORDER — AZITHROMYCIN 250 MG PO TABS: ORAL_TABLET | ORAL | 0 refills | Status: DC

## 2017-10-17 MED ORDER — LISINOPRIL 10 MG PO TABS: 10.0000 mg | ORAL_TABLET | Freq: Every day | ORAL | 0 refills | Status: DC

## 2017-10-17 MED ORDER — VENLAFAXINE HCL ER 75 MG PO CP24: 75.0000 mg | ORAL_CAPSULE | Freq: Every day | ORAL | 3 refills | Status: DC

## 2017-10-17 MED ORDER — VENLAFAXINE HCL ER 75 MG PO CP24: 75.0000 mg | ORAL_CAPSULE | Freq: Every day | ORAL | 0 refills | Status: DC

## 2017-10-17 MED ORDER — ADULT MULTIVITAMIN W/MINERALS CH
1.0000 | ORAL_TABLET | Freq: Every day | ORAL | 0 refills | Status: AC
Start: 2017-10-17 — End: ?

## 2017-10-17 MED ORDER — METFORMIN HCL 1000 MG PO TABS: 1000.0000 mg | ORAL_TABLET | Freq: Two times a day (BID) | ORAL | 3 refills | Status: DC

## 2017-10-17 MED ORDER — FISH OIL 1000 MG PO CAPS: 1.0000 | ORAL_CAPSULE | Freq: Every day | ORAL | 1 refills | Status: AC

## 2017-10-17 MED FILL — metFORMIN HCL 1000 MG TABS: 1000 | 30 days supply | Qty: 60 | Fill #0

## 2017-10-17 MED FILL — AZITHROMYCIN 250 MG TABLET: 250 | 5 days supply | Qty: 6 | Fill #0

## 2017-10-17 NOTE — Progress Notes (Signed)
Patient ID: [name] Barker, female   DOB: 03/16/1961, 56 y.o.   MRN: [id number]   [name] Barker, is a 56 y.o. female  VXB:[id number]  [id number]:[id number]  DOB - Jul 31, 1961  Subjective:  Chief Complaint and HPI: [name] Barker is a 56 y.o. female here today for diabetes check, medication RF, and a cough for about 1 month.  Cough started with URI and continues to linger.  No f/c.  No night sweats.  No hemoptysis.  Cough is worse in the daytime.  Feels fine other than cough.  OTC hasn't helped.  No alleviating/exacerbating factors.  Mucus is greenish-yellow  Out of all meds for about 1 week.    Denies s/sx hyper/hypoglycemia.  Compliant with meds other than being out of them for the last week.  Was unable to get RF due to cost.    Denies HA/CP/SOB/Dizziness.    ROS:   Constitutional:  No f/c, No night sweats, No unexplained weight loss. EENT:  No vision changes, No blurry vision, No hearing changes. No mouth, throat, or ear problems.  Respiratory: + cough, No SOB Cardiac: No CP, no palpitations GI:  No abd pain, No N/V/D. GU: No Urinary s/sx Musculoskeletal: No joint pain Neuro: No headache, no dizziness, no motor weakness.  Skin: No rash Endocrine:  No polydipsia. No polyuria.  Psych: Denies SI/HI  No problems updated.  ALLERGIES: No Known Allergies  PAST MEDICAL HISTORY: Past Medical History:  Diagnosis Date  . Diabetes mellitus Dx 1998  . High cholesterol   . Hip pain   . Other and unspecified hyperlipidemia   . Personal history of unspecified circulatory disease   . S/P cardiac cath June 2008   abnormal cardiolite. LVH..question on prior studies and question septal hypertroph...not seen echo... EF 65%    MEDICATIONS AT HOME: Prior to Admission medications   Medication Sig Start Date End Date Taking? Authorizing Provider  aspirin 325 MG tablet Take 325 mg by mouth daily.      [provider]  azithromycin (ZITHROMAX) 250 MG tablet Take 2 today then 1 daily  10/17/17   Freeman Caldron M, PA-C  Blood Glucose Monitoring Suppl (ACCU-CHEK NANO SMARTVIEW) w/Device KIT Use as directed for once daily testing of blood sugar. E11.9 10/15/17   Tresa Garter, MD  lidocaine (LIDODERM) 5 % Place 1 patch onto the skin daily. Remove & Discard patch within 12 hours or as directed by MD Patient not taking: Reported on 09/04/2017 08/22/17   Clayton Bibles, PA-C  lisinopril (PRINIVIL,ZESTRIL) 10 MG tablet Take 1 tablet (10 mg total) by mouth daily. 10/17/17   Argentina Donovan, PA-C  metFORMIN (GLUCOPHAGE) 1000 MG tablet Take 1 tablet (1,000 mg total) by mouth 2 (two) times daily with a meal. 10/17/17   Scotlyn Mccranie, Dionne Bucy, PA-C  Multiple Vitamin (MULTIVITAMIN WITH MINERALS) TABS tablet Take 1 tablet by mouth daily. 10/17/17   Argentina Donovan, PA-C  Omega-3 Fatty Acids (FISH OIL) 1000 MG CAPS Take 1 capsule (1,000 mg total) by mouth daily. 10/17/17   Argentina Donovan, PA-C  simvastatin (ZOCOR) 20 MG tablet Take 1 tablet (20 mg total) by mouth at bedtime. 10/17/17   Argentina Donovan, PA-C  tetrahydrozoline 0.05 % ophthalmic solution Place 2 drops into both eyes daily as needed (for dry eyes).    [provider]  tiZANidine (ZANAFLEX) 2 MG tablet Take 1 tablet (2 mg total) by mouth every 6 (six) hours as needed for muscle spasms. 10/15/17   Tresa Garter, MD  venlafaxine XR (EFFEXOR XR) 75 MG 24 hr capsule Take 1 capsule (75 mg total) by mouth daily with breakfast. 10/17/17   Argentina Donovan, PA-C     Objective:  EXAM:   Vitals:   10/17/17 1041  BP: (!) 147/80  Pulse: 89  Resp: 18  Temp: 98.1 F (36.7 C)  TempSrc: Oral  SpO2: 100%  Weight: 169 lb (76.7 kg)  Height: '5\' 1"'  (1.549 m)    General appearance : A&OX3. NAD. Non-toxic-appearing HEENT: Atraumatic and Normocephalic.  PERRLA. EOM intact.  TM clear B. Mouth-MMM, post pharynx WNL w/o erythema, + PND. Neck: supple, no JVD. No cervical lymphadenopathy. No thyromegaly Chest/Lungs:   Breathing-non-labored, Good air entry bilaterally, breath sounds normal without rales, rhonchi, or wheezing  CVS: S1 S2 regular, no murmurs, gallops, rubs  Extremities: Bilateral Lower Ext shows no edema, both legs are warm to touch with = pulse throughout Neurology:  CN II-XII grossly intact, Non focal.   Psych:  TP linear. J/I WNL. Normal speech. Appropriate eye contact and affect.  Skin:  No Rash  Data Review Lab Results  Component Value Date   HGBA1C 6.1 05/09/2017   HGBA1C 5.9 09/13/2016   HGBA1C 5.90 12/27/2015     Assessment & Plan   1. Bronchitis Will cover for atypicals due to length of illness - azithromycin (ZITHROMAX) 250 MG tablet; Take 2 today then 1 daily  Dispense: 6 tablet; Refill: 0  2. Type 2 diabetes mellitus without complication, without long-term current use of insulin (HCC) uncontrolled - HgB A1c=6.5 today vs 6.1 04/2017 - Glucose (CBG) Increase dose- metFORMIN (GLUCOPHAGE) 1000 MG tablet; Take 1 tablet (1,000 mg total) by mouth 2 (two) times daily with a meal.  Dispense: 180 tablet; Refill: 3 I have had a lengthy discussion and provided education about insulin resistance and the intake of too much sugar/refined carbohydrates.  I have advised the patient to work at a goal of eliminating sugary drinks, candy, desserts, sweets, refined sugars, processed foods, and white carbohydrates.  The patient expresses understanding.    3. Essential hypertension, benign Uncontrolled-but out of meds.  Restart-  restart- lisinopril (PRINIVIL,ZESTRIL) 10 MG tablet; Take 1 tablet (10 mg total) by mouth daily.  Dispense: 30 tablet; Refill: 3 We have discussed target BP range and blood pressure goal. I have advised patient to check BP regularly and to call us back or report to clinic if the numbers are consistently higher than 140/90. We discussed the importance of compliance with medical therapy and DASH diet recommended, consequences of uncontrolled hypertension discussed.   4.  Perimenopausal continue- venlafaxine XR (EFFEXOR XR) 75 MG 24 hr capsule; Take 1 capsule (75 mg total) by mouth daily with breakfast.  Dispense: 30 capsule; Refill: 3  5. 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  Dispense: 30 capsule; Refill: 3  6. Hyperlipidemia, unspecified hyperlipidemia type Continue-  - simvastatin (ZOCOR) 20 MG tablet; Take 1 tablet (20 mg total) by mouth at bedtime.  Dispense: 30 tablet; Refill: 0 - Omega-3 Fatty Acids (FISH OIL) 1000 MG CAPS; Take 1 capsule (1,000 mg total) by mouth daily.  Dispense: 100 capsule; Refill: 1   Patient have been counseled extensively about nutrition and exercise  Return in about 3 months (around 01/17/2018) for assign new PCP; f/up DM/htn/hyperlipidemia.  The patient was given clear instructions to go to ER or return to medical center  if symptoms don't improve, worsen or new problems develop. The patient verbalized understanding. The patient was told to call to get lab results if they haven't heard anything in the next week.     Freeman Caldron, PA-C [facility] [place], [place]   10/17/2017, 10:51 AM

## 2017-10-17 NOTE — Patient Instructions (Signed)
Diabetes Mellitus and Food It is important for you to manage your blood sugar (glucose) level. Your blood glucose level can be greatly affected by what you eat. Eating healthier foods in the appropriate amounts throughout the day at about the same time each day will help you control your blood glucose level. It can also help slow or prevent worsening of your diabetes mellitus. Healthy eating may even help you improve the level of your blood pressure and reach or maintain a healthy weight. General recommendations for healthful eating and cooking habits include:  Eating meals and snacks regularly. Avoid going long periods of time without eating to lose weight.  Eating a diet that consists mainly of plant-based foods, such as fruits, vegetables, nuts, legumes, and whole grains.  Using low-heat cooking methods, such as baking, instead of high-heat cooking methods, such as deep frying.  Work with your dietitian to make sure you understand how to use the Nutrition Facts information on food labels. How can food affect me? Carbohydrates Carbohydrates affect your blood glucose level more than any other type of food. Your dietitian will help you determine how many carbohydrates to eat at each meal and teach you how to count carbohydrates. Counting carbohydrates is important to keep your blood glucose at a healthy level, especially if you are using insulin or taking certain medicines for diabetes mellitus. Alcohol Alcohol can cause sudden decreases in blood glucose (hypoglycemia), especially if you use insulin or take certain medicines for diabetes mellitus. Hypoglycemia can be a life-threatening condition. Symptoms of hypoglycemia (sleepiness, dizziness, and disorientation) are similar to symptoms of having too much alcohol. If your health care provider has given you approval to drink alcohol, do so in moderation and use the following guidelines:  Women should not have more than one drink per day, and men  should not have more than two drinks per day. One drink is equal to: ? 12 oz of beer. ? 5 oz of wine. ? 1 oz of hard liquor.  Do not drink on an empty stomach.  Keep yourself hydrated. Have water, diet soda, or unsweetened iced tea.  Regular soda, juice, and other mixers might contain a lot of carbohydrates and should be counted.  What foods are not recommended? As you make food choices, it is important to remember that all foods are not the same. Some foods have fewer nutrients per serving than other foods, even though they might have the same number of calories or carbohydrates. It is difficult to get your body what it needs when you eat foods with fewer nutrients. Examples of foods that you should avoid that are high in calories and carbohydrates but low in nutrients include:  Trans fats (most processed foods list trans fats on the Nutrition Facts label).  Regular soda.  Juice.  Candy.  Sweets, such as cake, pie, doughnuts, and cookies.  Fried foods.  What foods can I eat? Eat nutrient-rich foods, which will nourish your body and keep you healthy. The food you should eat also will depend on several factors, including:  The calories you need.  The medicines you take.  Your weight.  Your blood glucose level.  Your blood pressure level.  Your cholesterol level.  You should eat a variety of foods, including:  Protein. ? Lean cuts of meat. ? Proteins low in saturated fats, such as fish, egg whites, and beans. Avoid processed meats.  Fruits and vegetables. ? Fruits and vegetables that may help control blood glucose levels, such as apples,   mangoes, and yams.  Dairy products. ? Choose fat-free or low-fat dairy products, such as milk, yogurt, and cheese.  Grains, bread, pasta, and rice. ? Choose whole grain products, such as multigrain bread, whole oats, and brown rice. These foods may help control blood pressure.  Fats. ? Foods containing healthful fats, such as  nuts, avocado, olive oil, canola oil, and fish.  Does everyone with diabetes mellitus have the same meal plan? Because every person with diabetes mellitus is different, there is not one meal plan that works for everyone. It is very important that you meet with a dietitian who will help you create a meal plan that is just right for you. This information is not intended to replace advice given to you by your health care provider. Make sure you discuss any questions you have with your health care provider. Document Released: 09/06/2005 Document Revised: 05/17/2016 Document Reviewed: 11/06/2013 Else[employer] Interactive Patient Education  2017 [employer].  

## 2017-10-18 ENCOUNTER — Other Ambulatory Visit: Payer: Self-pay | Admitting: Pharmacist

## 2017-10-18 DIAGNOSIS — G4701 Insomnia due to medical condition: Secondary | ICD-10-CM

## 2017-10-18 DIAGNOSIS — N951 Menopausal and female climacteric states: Secondary | ICD-10-CM

## 2017-10-18 MED ORDER — VENLAFAXINE HCL ER 75 MG PO CP24: 75.0000 mg | ORAL_CAPSULE | Freq: Every day | ORAL | 0 refills | Status: DC

## 2017-10-18 MED ORDER — ACCU-CHEK SMARTVIEW CONTROL VI LIQD: 2 refills | Status: DC

## 2017-10-18 MED ORDER — ACCU-CHEK NANO SMARTVIEW W/DEVICE KIT: PACK | 0 refills | Status: DC

## 2017-10-21 ENCOUNTER — Other Ambulatory Visit: Payer: Self-pay | Admitting: Pharmacist

## 2017-10-21 DIAGNOSIS — E119 Type 2 diabetes mellitus without complications: Secondary | ICD-10-CM

## 2017-10-21 DIAGNOSIS — I10 Essential (primary) hypertension: Secondary | ICD-10-CM

## 2017-10-21 DIAGNOSIS — E785 Hyperlipidemia, unspecified: Secondary | ICD-10-CM

## 2017-10-21 MED ORDER — SIMVASTATIN 20 MG PO TABS: 20.0000 mg | ORAL_TABLET | Freq: Every day | ORAL | 3 refills | Status: DC

## 2017-10-21 MED ORDER — LISINOPRIL 10 MG PO TABS: 10.0000 mg | ORAL_TABLET | Freq: Every day | ORAL | 3 refills | Status: DC

## 2017-10-21 MED ORDER — METFORMIN HCL 1000 MG PO TABS: 1000.0000 mg | ORAL_TABLET | Freq: Two times a day (BID) | ORAL | 3 refills | Status: DC

## 2017-10-23 ENCOUNTER — Other Ambulatory Visit: Payer: Self-pay

## 2017-10-23 ENCOUNTER — Encounter: Payer: Self-pay | Admitting: Pharmacist

## 2017-10-23 ENCOUNTER — Other Ambulatory Visit: Payer: Self-pay | Admitting: Pharmacist

## 2017-10-23 NOTE — Patient Outreach (Signed)
[facility]) Care Management   10/23/2017  [name] Barker [birth year] [id number]  [name] Barker is an 56 y.o. female  Subjective: "I feel pretty good".  Objective: BP (!) 152/84   Pulse 88   Resp 20   Ht 1.549 m ('5\' 1"' )   Wt 169 lb 8 oz (76.9 kg)   LMP 02/21/2016   SpO2 98%   BMI 32.03 kg/m  Repeat blood pressure-140/88.  Review of Systems  Respiratory:       Lungs clear, respirations even and unlabored.  Cardiovascular:       S1S2 noted regular    Physical Exam  Encounter Medications:   Outpatient Encounter Prescriptions as of 10/23/2017  Medication Sig Note  . aspirin 325 MG tablet Take 325 mg by mouth daily.   10/23/2017: Not taking due to cost and transportation  . azithromycin (ZITHROMAX) 250 MG tablet Take 2 today then 1 daily   . Blood Glucose Calibration (ACCU-CHEK SMARTVIEW CONTROL) LIQD Use as directed   . Blood Glucose Monitoring Suppl (ACCU-CHEK NANO SMARTVIEW) w/Device KIT Use as directed for once daily testing of blood sugar. E11.9   . lisinopril (PRINIVIL,ZESTRIL) 10 MG tablet Take 1 tablet (10 mg total) by mouth daily.   . metFORMIN (GLUCOPHAGE) 1000 MG tablet Take 1 tablet (1,000 mg total) by mouth 2 (two) times daily with a meal.   . Multiple Vitamin (MULTIVITAMIN WITH MINERALS) TABS tablet Take 1 tablet by mouth daily.   . Omega-3 Fatty Acids (FISH OIL) 1000 MG CAPS Take 1 capsule (1,000 mg total) by mouth daily.   . simvastatin (ZOCOR) 20 MG tablet Take 1 tablet (20 mg total) by mouth at bedtime.   Marland Kitchen tetrahydrozoline 0.05 % ophthalmic solution Place 2 drops into both eyes daily as needed (for dry eyes).   . venlafaxine XR (EFFEXOR XR) 75 MG 24 hr capsule Take 1 capsule (75 mg total) by mouth daily with breakfast. 10/23/2017: Not filled since 08/21/17   . [DISCONTINUED] lidocaine (LIDODERM) 5 % Place 1 patch onto the skin daily. Remove & Discard patch within 12 hours or as directed by MD (Patient not taking: Reported on 09/04/2017)  09/03/2017: [employer] would not approve  . [DISCONTINUED] tiZANidine (ZANAFLEX) 2 MG tablet Take 1 tablet (2 mg total) by mouth every 6 (six) hours as needed for muscle spasms. (Patient not taking: Reported on 10/23/2017)    No facility-administered encounter medications on file as of 10/23/2017.     Functional Status:   In your present state of health, do you have any difficulty performing the following activities: 09/04/2017 09/03/2017  Hearing? - N  Vision? - Y  Difficulty concentrating or making decisions? - N  Walking or climbing stairs? - Y  Dressing or bathing? - N  Doing errands, shopping? - N  Conservation officer, nature and eating ? N -  Using the Toilet? N -  In the past six months, have you accidently leaked urine? N -  Do you have problems with loss of bowel control? N -  Managing your Medications? N -  Managing your Finances? Y -  Comment difficulty paying for medications. -  Housekeeping or managing your Housekeeping? N -  Some recent data might be hidden    Fall/Depression Screening:    Fall Risk  10/17/2017 10/17/2017 09/03/2017  Falls in the past year? No No No   PHQ 2/9 Scores 10/17/2017 10/17/2017 10/03/2017 09/03/2017 05/09/2017 12/20/2016 11/08/2016  PHQ - 2 Score 0 0 0 0 0 0 0  PHQ-  9 Score 2 1 - - '1 2 4    ' Assessment:  56 year old with history of diabetes,HTN, hyperlipidemia, andright shoulder pain. RNCM completed home visit with [facility] Pharmacist, Denyse Amass.  History of diabetes- most recent HgA1c was 6.5% which is up from previous HgA1C levels. RNCM discussed importance of A1C levels in managing diabetes and preventing complications.   Medication management/adherence: see [facility] pharmacist note.  Client is agreeable to further diabetes education with Health coach.  Plan: transition to health coach-disease management diabetes.  Thea Silversmith, RN, MSN, [place] Coordinator Cell: [phone]

## 2017-10-23 NOTE — Patient Outreach (Signed)
[facility]) Care Management  [place]   10/23/2017  [name] Barker 1961-10-11 [id number]  Subjective: Home visit completed at the patient's home. HIPAA identifiers were obtained. [facility] Community Nurse, Thea Silversmith and the patient's sister were present for the visit. Patient is a 56 year old female with a medical history significant for:  Type 2 diabetes, GERD, hypertension and hyperlipidemia.    Patient manages her medications on her own.  She does not use a pill box and currently switched over to [employer].    Objective:   Encounter Medications: Outpatient Encounter Prescriptions as of 10/23/2017  Medication Sig Note  . azithromycin (ZITHROMAX) 250 MG tablet Take 2 today then 1 daily   . Blood Glucose Calibration (ACCU-CHEK SMARTVIEW CONTROL) LIQD Use as directed   . Blood Glucose Monitoring Suppl (ACCU-CHEK NANO SMARTVIEW) w/Device KIT Use as directed for once daily testing of blood sugar. E11.9   . lisinopril (PRINIVIL,ZESTRIL) 10 MG tablet Take 1 tablet (10 mg total) by mouth daily.   . metFORMIN (GLUCOPHAGE) 1000 MG tablet Take 1 tablet (1,000 mg total) by mouth 2 (two) times daily with a meal.   . Multiple Vitamin (MULTIVITAMIN WITH MINERALS) TABS tablet Take 1 tablet by mouth daily.   . Omega-3 Fatty Acids (FISH OIL) 1000 MG CAPS Take 1 capsule (1,000 mg total) by mouth daily.   . simvastatin (ZOCOR) 20 MG tablet Take 1 tablet (20 mg total) by mouth at bedtime.   Marland Kitchen tetrahydrozoline 0.05 % ophthalmic solution Place 2 drops into both eyes daily as needed (for dry eyes).   . venlafaxine XR (EFFEXOR XR) 75 MG 24 hr capsule Take 1 capsule (75 mg total) by mouth daily with breakfast.   . aspirin 325 MG tablet Take 325 mg by mouth daily.   10/23/2017: Not taking due to cost and transportation  . tiZANidine (ZANAFLEX) 2 MG tablet Take 1 tablet by mouth every 6 (six) hours as needed. 10/23/2017: Patient said she is not taking this  .  [DISCONTINUED] lidocaine (LIDODERM) 5 % Place 1 patch onto the skin daily. Remove & Discard patch within 12 hours or as directed by MD (Patient not taking: Reported on 09/04/2017) 09/03/2017: [employer] would not approve  . [DISCONTINUED] tiZANidine (ZANAFLEX) 2 MG tablet Take 1 tablet (2 mg total) by mouth every 6 (six) hours as needed for muscle spasms. (Patient not taking: Reported on 10/23/2017)    No facility-administered encounter medications on file as of 10/23/2017.     Functional Status: In your present state of health, do you have any difficulty performing the following activities: 09/04/2017 09/03/2017  Hearing? - N  Vision? - Y  Difficulty concentrating or making decisions? - N  Walking or climbing stairs? - Y  Dressing or bathing? - N  Doing errands, shopping? - N  Conservation officer, nature and eating ? N -  Using the Toilet? N -  In the past six months, have you accidently leaked urine? N -  Do you have problems with loss of bowel control? N -  Managing your Medications? N -  Managing your Finances? Y -  Comment difficulty paying for medications. -  Housekeeping or managing your Housekeeping? N -  Some recent data might be hidden    Fall/Depression Screening: Fall Risk  10/17/2017 10/17/2017 09/03/2017  Falls in the past year? No No No   PHQ 2/9 Scores 10/17/2017 10/17/2017 10/03/2017 09/03/2017 05/09/2017 12/20/2016 11/08/2016  PHQ - 2 Score 0 0 0 0 0  0 0  PHQ- 9 Score 2 1 - - '1 2 4      ' Assessment: Patient's medications were reviewed in her home.  Neurologic/Psychologic: Venlafaxine  Cardiovascular: Aspirin Lisinopril Simvastatin Omega 3 Fatty Acids   Endocrine: Metformin  Pain: Tizanidine   Vitamins/Minerals: Multiple Vitamin  Infectious Diseases: Azithromycin (one day of therapy left)  Miscellaneous: OTC Eye drops (tetrahydrozoline 0.005%) .  Medication Review Findings:  Adverse Side Effect- coughing Patient complained of a nagging cough that has  been going on since September. She was recently started on Azithromycin.  Patient is currently out of lisinopril but it was filled 10/22/17 and will be delivered from [facility].  Recommendation-if cough does not subside, switching to an ARB like losartan may be prudent if deemed therapeutically appropriate.  Adherence:  Aspirin-patient reported being out of Aspirin due to cost and transportation.  [employer] OTC Program was called to investigate the patient's benefit amount.  Unfortunately, [employer] said the patient does not have an OTC benefit.  A note will be sent to the patient's provider requesting an actual prescription be sent to the patient's mail order pharmacy so it can be delivered to her home.  Venlafaxine/lisinopril/simvastatin--patient reported having difficulty getting her medications from her local pharmacy ([facility]).  The pharmacy was called and they reported not filling the venlafaxine and simvastatin since 08/21/17 for 30 day supplies.  Lisinopril was last filled 09/03/17.  They attempted to fill refills  but could not because the rejection message said the prescriptions had been filled somewhere else.  Review of the chart showed prescriptions for Venlafaxine, lisinopril, simvastatin and metformin were sent to [facility] order pharmacy.  [employer] was called and the representative said (Venlafaxine, simvastatin, lisinopril, and metformin were mailed to the patient on 10/22/17).  Patient's testing supplies were also filled and should be sent.  Blood glucose control  -patient's most recent HgA1c was 6.5%.  It was 5.9 01/17.  Medication non-adherence could have contributed to the patient's worsening blood glucose control .  Perhaps patient will be more compliant to her medication regimen since she will be receiving her medications from [place].   Plan: Route note to PCP Follow up with the patient in 1 week to see if she received  her medication from the mail order pharmacy.  Elayne Guerin, PharmD, [place] Clinical Pharmacist [phone]

## 2017-10-28 ENCOUNTER — Encounter: Payer: Self-pay | Admitting: *Deleted

## 2017-10-30 ENCOUNTER — Other Ambulatory Visit: Payer: Self-pay | Admitting: Physician Assistant

## 2017-10-30 ENCOUNTER — Other Ambulatory Visit: Payer: Self-pay | Admitting: Pharmacist

## 2017-10-30 MED ORDER — ASPIRIN EC 81 MG PO TBEC: 81.0000 mg | DELAYED_RELEASE_TABLET | Freq: Every day | ORAL | 3 refills | Status: DC

## 2017-10-30 NOTE — Patient Outreach (Signed)
[facility]) Care Management  [place]   10/30/2017  [name] Barker 02-Dec-1961 [id number]  Subjective: Patient was called to follow up on her medications being delivered from [place].  HIPAA identifiers were obtained.  Patient is a 56 year old female with a medical history significant for:  Type 2 diabetes, GERD, hypertension and hyperlipidemia.     Patient reported she received her medications from [facility] on 10/26/2017.  Objective:   Encounter Medications: Outpatient Encounter Medications as of 10/30/2017  Medication Sig Note  . azithromycin (ZITHROMAX) 250 MG tablet Take 2 today then 1 daily   . Blood Glucose Calibration (ACCU-CHEK SMARTVIEW CONTROL) LIQD Use as directed   . Blood Glucose Monitoring Suppl (ACCU-CHEK NANO SMARTVIEW) w/Device KIT Use as directed for once daily testing of blood sugar. E11.9   . lisinopril (PRINIVIL,ZESTRIL) 10 MG tablet Take 1 tablet (10 mg total) by mouth daily.   . metFORMIN (GLUCOPHAGE) 1000 MG tablet Take 1 tablet (1,000 mg total) by mouth 2 (two) times daily with a meal.   . Multiple Vitamin (MULTIVITAMIN WITH MINERALS) TABS tablet Take 1 tablet by mouth daily.   . Omega-3 Fatty Acids (FISH OIL) 1000 MG CAPS Take 1 capsule (1,000 mg total) by mouth daily.   . simvastatin (ZOCOR) 20 MG tablet Take 1 tablet (20 mg total) by mouth at bedtime.   Marland Kitchen tetrahydrozoline 0.05 % ophthalmic solution Place 2 drops into both eyes daily as needed (for dry eyes).   . venlafaxine XR (EFFEXOR XR) 75 MG 24 hr capsule Take 1 capsule (75 mg total) by mouth daily with breakfast.   . aspirin 325 MG tablet Take 325 mg by mouth daily.   10/23/2017: Not taking due to cost and transportation  . tiZANidine (ZANAFLEX) 2 MG tablet Take 1 tablet by mouth every 6 (six) hours as needed. 10/23/2017: Patient said she is not taking this   No facility-administered encounter medications on file as of 10/30/2017.     Functional Status: In  your present state of health, do you have any difficulty performing the following activities: 09/04/2017 09/03/2017  Hearing? - N  Vision? - Y  Difficulty concentrating or making decisions? - N  Walking or climbing stairs? - Y  Dressing or bathing? - N  Doing errands, shopping? - N  Conservation officer, nature and eating ? N -  Using the Toilet? N -  In the past six months, have you accidently leaked urine? N -  Do you have problems with loss of bowel control? N -  Managing your Medications? N -  Managing your Finances? Y -  Comment difficulty paying for medications. -  Housekeeping or managing your Housekeeping? N -  Some recent data might be hidden    Fall/Depression Screening: Fall Risk  10/17/2017 10/17/2017 09/03/2017  Falls in the past year? No No No   PHQ 2/9 Scores 10/17/2017 10/17/2017 10/03/2017 09/03/2017 05/09/2017 12/20/2016 11/08/2016  PHQ - 2 Score 0 0 0 0 0 0 0  PHQ- 9 Score 2 1 - - _0 Assessment: Patient's medications were reviewed via telephone.  Neurologic/Psychologic: Venlafaxine  Cardiovascular: Aspirin-patient does not have aspirin in her possession due to transportation Lisinopril Simvastatin Omega 3 Fatty Acids  Endocrine: Metformin  Pain: Tizanidine-patient said she does not take this  Vitamins/Minerals: Multiple Vitamin  Infectious Diseases: Azithromycin (one day of therapy left)  Miscellaneous: OTC Eye drops (tetrahydrozoline 0.005%) . Medication Review Findings: Aspirin-patient reported being out  of Aspirin due to cost and transportation.  [employer] OTC Program was called on 10/23/17 to investigate the patient's benefit amount.  Unfortunately, [employer] said the patient does not have an OTC benefit.  A note will be resent to the patient's provider requesting an actual prescription be sent to the patient's mail order pharmacy so it can be delivered to her home.  Patient was educated on how and when to reorder her medications from [place]:  1. Route note to PCP 2. Call patient back in 7-10 business days to check on aspirin.

## 2017-11-08 ENCOUNTER — Ambulatory Visit: Payer: Self-pay | Admitting: Pharmacist

## 2017-11-09 DIAGNOSIS — E119 Type 2 diabetes mellitus without complications: Secondary | ICD-10-CM | POA: Diagnosis not present

## 2017-11-09 DIAGNOSIS — Z6831 Body mass index (BMI) 31.0-31.9, adult: Secondary | ICD-10-CM | POA: Diagnosis not present

## 2017-11-09 DIAGNOSIS — E669 Obesity, unspecified: Secondary | ICD-10-CM | POA: Diagnosis not present

## 2017-11-09 DIAGNOSIS — F3340 Major depressive disorder, recurrent, in remission, unspecified: Secondary | ICD-10-CM | POA: Diagnosis not present

## 2017-11-09 DIAGNOSIS — E785 Hyperlipidemia, unspecified: Secondary | ICD-10-CM | POA: Diagnosis not present

## 2017-11-09 DIAGNOSIS — Z7982 Long term (current) use of aspirin: Secondary | ICD-10-CM | POA: Diagnosis not present

## 2017-11-09 DIAGNOSIS — I10 Essential (primary) hypertension: Secondary | ICD-10-CM | POA: Diagnosis not present

## 2017-11-11 ENCOUNTER — Other Ambulatory Visit: Payer: Self-pay | Admitting: Licensed Clinical Social Worker

## 2017-11-11 NOTE — Patient Outreach (Signed)
[facility] [facility]) Care Management  11/11/2017  [name] Barker [birth year] [id number]  Assessment- CSW completed outreach call to patient and she answered. She reports that she is doing very well. She denies being in any pain or discomfort but mentions feeling weak at times. Patient now has stable transportation through [place]. CSW completed brief review of senior resources within her area and patient reports being familiar with resources now. Patient admits still having issues with sleeping but is attempting to implement appropriate self care methods to help her sleeping habits such as: turning off the television, reading and deep breathing exercises. Patient denies having any other social work needs at this time and is agreeable to social work discharge.  [facility] CM Care Plan Problem One     Most Recent Value  Care Plan Problem One  Lack of stable transportation and community resource knowledge   Role Documenting the Problem One  Clinical Social Worker  Care Plan for Problem One  Active  [facility]N Long Term Goal   Patient will gain stable transportation within 90 days  [facility] Long Term Goal Start Date  08/22/17  [facility] Long Term Goal Met Date  10/01/17  Interventions for Problem One Long Term Goal  GOAL MET. Patient not has stable transportation through [place] Short Term Goal #1   Patient will be able to read back 3 community resources within 30 days to help improve her socialization, activity and health  [facility] CM Short Term Goal #1 Start Date  10/01/17  [facility] CM Short Term Goal #1 Met Date  11/11/17  Interventions for Short Term Goal #1  Goal achieved. Patient was able to meet goal successfully.  [facility]N CM Short Term Goal #2   Patient will implement 3 self care methods into her routine over the next 30 days day to increased stress and little to no sleep  [facility]N CM Short Term Goal #2 Start Date  10/03/17  [facility] CM Short Term Goal #2 Met Date  11/11/17  Interventions for Short Term Goal #2  Goal met.  Patient was able to implement appropriate self care methods into her routine.     Plan-CSW will complete social work discharge and will update involved [place] care team.  [name], [place], MSW, [email or web address] Phone: [phone] Fax: [phone]

## 2017-11-13 ENCOUNTER — Other Ambulatory Visit: Payer: Self-pay | Admitting: Pharmacist

## 2017-11-13 NOTE — Patient Outreach (Signed)
[facility]) Care Management  11/13/2017  [name] Barker 02-15-1961 [id number]   Patient was called to follow up aspirin. HIPAA identifiers were obtained. Patient confirmed she was able to get her aspirin. In addition she confirmed she has all of her medications and understands how [facility]   Plan: Close patient's pharmacy case. Patient is still being followed by Health Coach, [name] Barker.  [name] Barker, PharmD, BCACP [facility] Clinical Pharmacist [phone]

## 2017-11-27 ENCOUNTER — Other Ambulatory Visit: Payer: Self-pay | Admitting: *Deleted

## 2017-11-28 ENCOUNTER — Other Ambulatory Visit: Payer: Self-pay | Admitting: *Deleted

## 2017-11-28 NOTE — Patient Outreach (Signed)
[facility]) Care Management  11/28/2017  Sheffield SliderShirley Guardino 1961/01/16 [id number]   RN Health Coach attempted #1 follow up outreach call to patient.  Patient was unavailable. HIPPA compliance voicemail message left with return callback number.  Plan: RN will call patient again within 14 days.  Gean MaidensFrances Kross Swallows BSN RN [facility] Care Management [phone]

## 2017-11-28 NOTE — Patient Outreach (Signed)
[facility]) Care Management  11/28/2017  [name] Barker 1961/09/08 [id number]  RN Health Coach received return telephone call from  patient.  Hipaa compliance verified. Per patient she has gotten a new a  meter. She had not checked blood sugar for today. Per patient she last checked it yesterday and it was 94. Patient last A1C was 6.5Patient stated she walks to the to the bus stoop. Patient uses the [employer]at bus for transportation to and from doctor appointment. Patient stated she is not having any pain and she has not problems getting up and down. Patient has not been to the dentist or had a hearing test due to the lack of funds. Patient stated she is trying to . Patient is needing help with he diabetes more in the nutritional area. Patient would like to referred to a nutritionist. Patient has agreed to further outreach calls.   Current Medications:  Current Outpatient Medications  Medication Sig Dispense Refill  . aspirin EC 81 MG tablet Take 1 tablet (81 mg total) by mouth daily. 90 tablet 3  . azithromycin (ZITHROMAX) 250 MG tablet Take 2 today then 1 daily (Patient not taking: Reported on 01/13/2018) 6 tablet 0  . Blood Glucose Calibration (ACCU-CHEK SMARTVIEW CONTROL) LIQD Use as directed 1 each 2  . Blood Glucose Monitoring Suppl (ACCU-CHEK NANO SMARTVIEW) w/Device KIT Use as directed for once daily testing of blood sugar. E11.9 1 kit 0  . lisinopril (PRINIVIL,ZESTRIL) 10 MG tablet Take 1 tablet (10 mg total) by mouth daily. 90 tablet 3  . metFORMIN (GLUCOPHAGE) 1000 MG tablet Take 1 tablet (1,000 mg total) by mouth 2 (two) times daily with a meal. 180 tablet 3  . Multiple Vitamin (MULTIVITAMIN WITH MINERALS) TABS tablet Take 1 tablet by mouth daily. 100 tablet 0  . Omega-3 Fatty Acids (FISH OIL) 1000 MG CAPS Take 1 capsule (1,000 mg total) by mouth daily. 100 capsule 1  . simvastatin (ZOCOR) 20 MG tablet Take 1 tablet (20 mg total) by mouth at bedtime. 90 tablet 3  .  tetrahydrozoline 0.05 % ophthalmic solution Place 2 drops into both eyes daily as needed (for dry eyes).    Marland Kitchen tiZANidine (ZANAFLEX) 2 MG tablet Take 1 tablet by mouth every 6 (six) hours as needed.    . venlafaxine XR (EFFEXOR-XR) 75 MG 24 hr capsule TAKE 1 CAPSULE DAILY WITH BREAKFAST 90 capsule 3   No current facility-administered medications for this visit.     Functional Status:  In your present state of health, do you have any difficulty performing the following activities: 11/28/2017 09/04/2017  Hearing? N -  Vision? N -  Difficulty concentrating or making decisions? N -  Walking or climbing stairs? N -  Dressing or bathing? N -  Doing errands, shopping? N -  Preparing Food and eating ? N N  Using the Toilet? N N  In the past six months, have you accidently leaked urine? N N  Do you have problems with loss of bowel control? N N  Managing your Medications? Y N  Comment per patient the [facility] pharmacy has helped her get back on track with her medications -  Managing your Finances? N Y  Comment - difficulty paying for medications.  Housekeeping or managing your Housekeeping? N N  Some recent data might be hidden    Fall/Depression Screening: Fall Risk  01/13/2018 11/28/2017 10/17/2017  Falls in the past year? No No No   PHQ 2/9 Scores 01/13/2018 11/28/2017 10/17/2017 10/17/2017 10/03/2017  09/03/2017 05/09/2017  PHQ - 2 Score 3 0 0 0 0 0 0  PHQ- 9 Score 8 - 2 1 - - 1   THN CM Care Plan Problem One     Most Recent Value  Care Plan Problem One  Knowledge Deficit in Self Management of Diabetes  Role Documenting the Problem One  [place] for Problem One  Active  THN Long Term Goal   Patient will be able to choose  healthier snacks within the next 90 days  THN Long Term Goal Start Date  02/13/18  Interventions for Problem One Long Term Goal  RN discusse what snacks the patient was eating/RN sent list of healthy snacks/ RN will follow uo with further discussion  THN CM  Short Term Goal #1   Patient will report planning meals within her diet within the next 30 days  THN CM Short Term Goal #1 Start Date  11/28/17  Interventions for Short Term Goal #1  RN sent patient a list of food choices for dinning out/ RN will follow up with physician for nutritional classes/ RN will follow up with further discussion and outreach  [facility] CM Short Term Goal #2   Patient will report checking blood sugars daily and documenting  THN CM Short Term Goal #2 Start Date  11/28/17  Interventions for Short Term Goal #2  RN sent educational material on why cehck blood sugars. RN discussed how blood sugars affect the A1C. RN will follow up with further outreach.       Assessment:  Fasting blood sugar 94 A1C 6.5 Patient will benefit from [place] telephonic outreach for education and support for diabetes self management.  Plan:  RN discussed dining out eating habits RN discussed eating healthy snacks RN discussed checking blood sugars and documenting RN will follow up outreach RN sent barriers letter and assessment to [place] Management [phone]

## 2017-12-12 ENCOUNTER — Ambulatory Visit: Payer: Self-pay | Admitting: *Deleted

## 2017-12-23 ENCOUNTER — Other Ambulatory Visit: Payer: Self-pay | Admitting: Physician Assistant

## 2017-12-23 DIAGNOSIS — I10 Essential (primary) hypertension: Secondary | ICD-10-CM

## 2017-12-23 DIAGNOSIS — G4701 Insomnia due to medical condition: Secondary | ICD-10-CM

## 2017-12-23 DIAGNOSIS — E785 Hyperlipidemia, unspecified: Secondary | ICD-10-CM

## 2017-12-23 DIAGNOSIS — N951 Menopausal and female climacteric states: Secondary | ICD-10-CM

## 2018-01-03 ENCOUNTER — Encounter: Payer: Self-pay | Admitting: *Deleted

## 2018-01-13 ENCOUNTER — Encounter: Payer: Self-pay | Admitting: Internal Medicine

## 2018-01-13 ENCOUNTER — Ambulatory Visit: Payer: Medicare PPO | Attending: Internal Medicine | Admitting: Internal Medicine

## 2018-01-13 VITALS — BP 124/82 | HR 90 | Temp 98.0°F | Resp 16 | Ht 61.0 in | Wt 164.8 lb

## 2018-01-13 DIAGNOSIS — E785 Hyperlipidemia, unspecified: Secondary | ICD-10-CM | POA: Diagnosis not present

## 2018-01-13 DIAGNOSIS — Z7984 Long term (current) use of oral hypoglycemic drugs: Secondary | ICD-10-CM | POA: Diagnosis not present

## 2018-01-13 DIAGNOSIS — Z7689 Persons encountering health services in other specified circumstances: Secondary | ICD-10-CM | POA: Insufficient documentation

## 2018-01-13 DIAGNOSIS — K219 Gastro-esophageal reflux disease without esophagitis: Secondary | ICD-10-CM | POA: Insufficient documentation

## 2018-01-13 DIAGNOSIS — Z9049 Acquired absence of other specified parts of digestive tract: Secondary | ICD-10-CM | POA: Diagnosis not present

## 2018-01-13 DIAGNOSIS — G4700 Insomnia, unspecified: Secondary | ICD-10-CM | POA: Insufficient documentation

## 2018-01-13 DIAGNOSIS — D649 Anemia, unspecified: Secondary | ICD-10-CM | POA: Diagnosis not present

## 2018-01-13 DIAGNOSIS — Z780 Asymptomatic menopausal state: Secondary | ICD-10-CM | POA: Insufficient documentation

## 2018-01-13 DIAGNOSIS — Z1231 Encounter for screening mammogram for malignant neoplasm of breast: Secondary | ICD-10-CM | POA: Diagnosis not present

## 2018-01-13 DIAGNOSIS — E119 Type 2 diabetes mellitus without complications: Secondary | ICD-10-CM | POA: Insufficient documentation

## 2018-01-13 DIAGNOSIS — Z7982 Long term (current) use of aspirin: Secondary | ICD-10-CM | POA: Diagnosis not present

## 2018-01-13 DIAGNOSIS — Z79899 Other long term (current) drug therapy: Secondary | ICD-10-CM | POA: Insufficient documentation

## 2018-01-13 DIAGNOSIS — N951 Menopausal and female climacteric states: Secondary | ICD-10-CM

## 2018-01-13 DIAGNOSIS — I10 Essential (primary) hypertension: Secondary | ICD-10-CM | POA: Diagnosis not present

## 2018-01-13 DIAGNOSIS — Z1239 Encounter for other screening for malignant neoplasm of breast: Secondary | ICD-10-CM

## 2018-01-13 LAB — GLUCOSE, POCT (MANUAL RESULT ENTRY): POC Glucose: 83 mg/dl (ref 70–99)

## 2018-01-13 MED ORDER — VENLAFAXINE HCL ER 75 MG PO CP24: ORAL_CAPSULE | ORAL | 3 refills | Status: DC

## 2018-01-13 MED ORDER — SIMVASTATIN 20 MG PO TABS: 20.0000 mg | ORAL_TABLET | Freq: Every day | ORAL | 3 refills | Status: DC

## 2018-01-13 MED ORDER — ASPIRIN EC 81 MG PO TBEC: 81.0000 mg | DELAYED_RELEASE_TABLET | Freq: Every day | ORAL | 3 refills | Status: DC

## 2018-01-13 MED ORDER — LISINOPRIL 10 MG PO TABS: 10.0000 mg | ORAL_TABLET | Freq: Every day | ORAL | 3 refills | Status: DC

## 2018-01-13 MED ORDER — METFORMIN HCL 1000 MG PO TABS: 1000.0000 mg | ORAL_TABLET | Freq: Two times a day (BID) | ORAL | 3 refills | Status: DC

## 2018-01-13 NOTE — Patient Instructions (Signed)
Continue healthy eating and regular exercise.  Discontine drinking orange juice.  Drink 1 % milk.

## 2018-01-13 NOTE — Progress Notes (Signed)
Patient ID: [name] Barker, female    DOB: 06/21/1961  MRN: [id number]  CC: re-establish; Diabetes; and Hypertension   Subjective: [name] Barker is a 57 y.o. female who presents to est with me as PCP.  Last saw Dr. Adrian Blackwater 04/2017 Her concerns today include:  DM, HL, HTN, perimenopausal  1. DM:  Checks BS in a.m.  Range no higher than 140. Eating habits: always bake her meats. Drinks water, diet soda, milk and OJ Exercise: walks QOD  around her neighborhood and to the store Last eye exam was 1 yr with Dr. Corrin Parker.  No blurred vision Some numbness at tips of 2nd-4th fingers since winter.  Fingers she normal uses for accu check Med: compliant with Metformin  2.  HTN: Compliant with lisinopril. No chest pains or shortness of breath at rest on exertion.  No palpitations.  No lower extremity edema.  3.  HL: Compliant with Zocor.  Patient Active Problem List   Diagnosis Date Noted  . 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 Perimenopausal 09/13/2016  . Hyperlipidemia associated with type 2 diabetes mellitus ([place]) 07/22/2014  . Esophageal reflux 07/22/2014  . Diabetes ([place]) 04/22/2014  . Essential hypertension, benign 04/22/2014     Current Outpatient Medications on File Prior to Visit  Medication Sig Dispense Refill  . Blood Glucose Calibration (ACCU-CHEK SMARTVIEW CONTROL) LIQD Use as directed 1 each 2  . Blood Glucose Monitoring Suppl (ACCU-CHEK NANO SMARTVIEW) w/Device KIT Use as directed for once daily testing of blood sugar. E11.9 1 kit 0  . Multiple Vitamin (MULTIVITAMIN WITH MINERALS) TABS tablet Take 1 tablet by mouth daily. 100 tablet 0  . Omega-3 Fatty Acids (FISH OIL) 1000 MG CAPS Take 1 capsule (1,000 mg total) by mouth daily. 100 capsule 1  . tetrahydrozoline 0.05 % ophthalmic solution Place 2 drops into both eyes daily as needed (for dry eyes).    Marland Kitchen azithromycin (ZITHROMAX) 250 MG tablet Take 2 today then 1 daily (Patient not taking: Reported on 01/13/2018)  6 tablet 0  . tiZANidine (ZANAFLEX) 2 MG tablet Take 1 tablet by mouth every 6 (six) hours as needed.     No current facility-administered medications on file prior to visit.     No Known Allergies  Social History   Socioeconomic History  . Marital status: Single    Spouse name: Not on file  . Number of children: Not on file  . Years of education: Not on file  . Highest education level: Not on file  Social Needs  . Financial resource strain: Not on file  . Food insecurity - worry: Not on file  . Food insecurity - inability: Not on file  . Transportation needs - medical: Not on file  . Transportation needs - non-medical: Not on file  Occupational History  . Not on file  Tobacco Use  . Smoking status: Never Smoker  . Smokeless tobacco: Never Used  Substance and Sexual Activity  . Alcohol use: No  . Drug use: No  . Sexual activity: Not on file  Other Topics Concern  . 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     Family History  Problem Relation Age of Onset  . Hypertension Mother   . Hypertension Father   . Heart disease Father   . Stroke Other   . Diabetes Other   . Arthritis Other   . Hypertension Sister   . Hypertension  Brother   . Colon cancer Neg Hx     Past Surgical History:  Procedure Laterality Date  . CHOLECYSTECTOMY      ROS: Review of Systems MSK: Occasional aching in right shoulder PHYSICAL EXAM: BP 124/82   Pulse 90   Temp 98 F (36.7 C) (Oral)   Resp 16   Ht '5\' 1"'  (1.549 m)   Wt 164 lb 12.8 oz (74.8 kg)   LMP 02/21/2016   SpO2 100%   BMI 31.14 kg/m   Wt Readings from Last 3 Encounters:  01/13/18 164 lb 12.8 oz (74.8 kg)  10/23/17 169 lb 8 oz (76.9 kg)  10/17/17 169 lb (76.7 kg)    Physical Exam General appearance - alert, well appearing, middle-aged African-American female and in no distress Mental status - alert, oriented to person, place, and time, normal mood, behavior,  speech, dress, motor activity, and thought processes Eyes - pupils equal and reactive, extraocular eye movements intact Neck - supple, no significant adenopathy Chest - clear to auscultation, no wheezes, rales or rhonchi, symmetric air entry Heart - normal rate, regular rhythm, normal S1, S2, no murmurs, rubs, clicks or gallops Extremities - peripheral pulses normal, no pedal edema, no clubbing or cyanosis Diabetic Foot Exam - Simple   Simple Foot Form Visual Inspection No deformities, no ulcerations, no other skin breakdown bilaterally:  Yes Sensation Testing Intact to touch and monofilament testing bilaterally:  Yes Pulse Check Posterior Tibialis and Dorsalis pulse intact bilaterally:  Yes Comments Toenails a little overgrown     Results for orders placed or performed in visit on 10/17/17  HgB A1c  Result Value Ref Range   Hemoglobin A1C 6.5   Glucose (CBG)  Result Value Ref Range   POC Glucose 134 (A) 70 - 99 mg/dl      ASSESSMENT AND PLAN: 1. Type 2 diabetes mellitus without complication, without long-term current use of insulin (HCC) Well-controlled.  Continue metformin and healthy eating habits.  Continue regular exercise. - POCT glucose (manual entry) - CBC - Lipid panel - Comprehensive metabolic panel - metFORMIN (GLUCOPHAGE) 1000 MG tablet; Take 1 tablet (1,000 mg total) by mouth 2 (two) times daily with a meal.  Dispense: 180 tablet; Refill: 3 - aspirin EC 81 MG tablet; Take 1 tablet (81 mg total) by mouth daily.  Dispense: 90 tablet; Refill: 3 - Ambulatory referral to Ophthalmology  2. Essential hypertension, benign At goal. - lisinopril (PRINIVIL,ZESTRIL) 10 MG tablet; Take 1 tablet (10 mg total) by mouth daily.  Dispense: 90 tablet; Refill: 3  3. Hyperlipidemia, unspecified hyperlipidemia type - simvastatin (ZOCOR) 20 MG tablet; Take 1 tablet (20 mg total) by mouth at bedtime.  Dispense: 90 tablet; Refill: 3  4. Perimenopausal - venlafaxine XR  (EFFEXOR-XR) 75 MG 24 hr capsule; TAKE 1 CAPSULE DAILY WITH BREAKFAST  Dispense: 90 capsule; Refill: 3  5. Breast cancer screening - MM Digital Screening; Future  Patient was given the opportunity to ask questions.  Patient verbalized understanding of the plan and was able to repeat key elements of the plan.   Orders Placed This Encounter  Procedures  . MM Digital Screening  . CBC  . Lipid panel  . Comprehensive metabolic panel  . Ambulatory referral to Ophthalmology  . POCT glucose (manual entry)     Requested Prescriptions   Signed Prescriptions Disp Refills  . lisinopril (PRINIVIL,ZESTRIL) 10 MG tablet 90 tablet 3    Sig: Take 1 tablet (10 mg total) by mouth daily.  . simvastatin (  ZOCOR) 20 MG tablet 90 tablet 3    Sig: Take 1 tablet (20 mg total) by mouth at bedtime.  . metFORMIN (GLUCOPHAGE) 1000 MG tablet 180 tablet 3    Sig: Take 1 tablet (1,000 mg total) by mouth 2 (two) times daily with a meal.  . venlafaxine XR (EFFEXOR-XR) 75 MG 24 hr capsule 90 capsule 3    Sig: TAKE 1 CAPSULE DAILY WITH BREAKFAST  . aspirin EC 81 MG tablet 90 tablet 3    Sig: Take 1 tablet (81 mg total) by mouth daily.    Return in about 4 months (around 05/13/2018).  Karle Plumber, MD, FACP

## 2018-01-14 LAB — LIPID PANEL
CHOL/HDL RATIO: 2.7 ratio (ref 0.0–4.4)
Cholesterol, Total: 170 mg/dL (ref 100–199)
HDL: 63 mg/dL (ref >39)
LDL Calculated: 83 mg/dL (ref 0–99)
Triglycerides: 118 mg/dL (ref 0–149)
VLDL CHOLESTEROL CAL: 24 mg/dL (ref 5–40)

## 2018-01-14 LAB — COMPREHENSIVE METABOLIC PANEL
ALK PHOS: 70 IU/L (ref 39–117)
ALT: 15 IU/L (ref 0–32)
AST: 13 IU/L (ref 0–40)
Albumin/Globulin Ratio: 1.8 (ref 1.2–2.2)
Albumin: 4.5 g/dL (ref 3.5–5.5)
BILIRUBIN TOTAL: 0.4 mg/dL (ref 0.0–1.2)
BUN/Creatinine Ratio: 10 (ref 9–23)
BUN: 12 mg/dL (ref 6–24)
CHLORIDE: 106 mmol/L (ref 96–106)
CO2: 24 mmol/L (ref 20–29)
Calcium: 9.6 mg/dL (ref 8.7–10.2)
Creatinine, Ser: 1.2 mg/dL — ABNORMAL HIGH (ref 0.57–1.00)
GFR calc Af Amer: 58 mL/min/{1.73_m2} — ABNORMAL LOW (ref >59)
GFR calc non Af Amer: 50 mL/min/{1.73_m2} — ABNORMAL LOW (ref >59)
Globulin, Total: 2.5 g/dL (ref 1.5–4.5)
Glucose: 93 mg/dL (ref 65–99)
Potassium: 4.4 mmol/L (ref 3.5–5.2)
Sodium: 144 mmol/L (ref 134–144)
TOTAL PROTEIN: 7 g/dL (ref 6.0–8.5)

## 2018-01-14 LAB — CBC
HEMATOCRIT: 32.6 % — AB (ref 34.0–46.6)
Hemoglobin: 11 g/dL — ABNORMAL LOW (ref 11.1–15.9)
MCH: 28.6 pg (ref 26.6–33.0)
MCHC: 33.7 g/dL (ref 31.5–35.7)
MCV: 85 fL (ref 79–97)
Platelets: 316 10*3/uL (ref 150–379)
RBC: 3.85 x10E6/uL (ref 3.77–5.28)
RDW: 14.3 % (ref 12.3–15.4)
WBC: 6.6 10*3/uL (ref 3.4–10.8)

## 2018-01-15 NOTE — Addendum Note (Signed)
Addended by: Jonah BlueJOHNSON, Jayme Cham B on: 01/15/2018 08:00 PM   Modules accepted: Orders

## 2018-01-15 NOTE — Progress Notes (Signed)
Pt with normocytic anemia on CBC.  Will ask lab to add iron studies, Vit B 12/folate.  NL c-scope in 2015.  Results for orders placed or performed in visit on 01/13/18  CBC  Result Value Ref Range   WBC 6.6 3.4 - 10.8 x10E3/uL   RBC 3.85 3.77 - 5.28 x10E6/uL   Hemoglobin 11.0 (L) 11.1 - 15.9 g/dL   Hematocrit 96.032.6 (L) 45.434.0 - 46.6 %   MCV 85 79 - 97 fL   MCH 28.6 26.6 - 33.0 pg   MCHC 33.7 31.5 - 35.7 g/dL   RDW 09.814.3 11.912.3 - 14.715.4 %   Platelets 316 150 - 379 x10E3/uL  Lipid panel  Result Value Ref Range   Cholesterol, Total 170 100 - 199 mg/dL   Triglycerides 829118 0 - 149 mg/dL   HDL 63 >56>39 mg/dL   VLDL Cholesterol Cal 24 5 - 40 mg/dL   LDL Calculated 83 0 - 99 mg/dL   Chol/HDL Ratio 2.7 0.0 - 4.4 ratio  Comprehensive metabolic panel  Result Value Ref Range   Glucose 93 65 - 99 mg/dL   BUN 12 6 - 24 mg/dL   Creatinine, Ser 2.131.20 (H) 0.57 - 1.00 mg/dL   GFR calc non Af Amer 50 (L) >59 mL/min/1.73   GFR calc Af Amer 58 (L) >59 mL/min/1.73   BUN/Creatinine Ratio 10 9 - 23   Sodium 144 134 - 144 mmol/L   Potassium 4.4 3.5 - 5.2 mmol/L   Chloride 106 96 - 106 mmol/L   CO2 24 20 - 29 mmol/L   Calcium 9.6 8.7 - 10.2 mg/dL   Total Protein 7.0 6.0 - 8.5 g/dL   Albumin 4.5 3.5 - 5.5 g/dL   Globulin, Total 2.5 1.5 - 4.5 g/dL   Albumin/Globulin Ratio 1.8 1.2 - 2.2   Bilirubin Total 0.4 0.0 - 1.2 mg/dL   Alkaline Phosphatase 70 39 - 117 IU/L   AST 13 0 - 40 IU/L   ALT 15 0 - 32 IU/L  POCT glucose (manual entry)  Result Value Ref Range   POC Glucose 83 70 - 99 mg/dl

## 2018-01-16 ENCOUNTER — Telehealth: Payer: Self-pay

## 2018-01-16 NOTE — Telephone Encounter (Signed)
Contacted pt to go over lab results pt is aware of results and doesn't have any questions or concerns 

## 2018-01-17 ENCOUNTER — Ambulatory Visit: Payer: Medicare PPO | Admitting: Internal Medicine

## 2018-01-21 LAB — VITAMIN B12: VITAMIN B 12: 433 pg/mL (ref 232–1245)

## 2018-01-21 LAB — SPECIMEN STATUS REPORT

## 2018-01-24 DIAGNOSIS — H269 Unspecified cataract: Secondary | ICD-10-CM

## 2018-01-24 HISTORY — DX: Unspecified cataract: H26.9

## 2018-02-13 DIAGNOSIS — Z7984 Long term (current) use of oral hypoglycemic drugs: Secondary | ICD-10-CM | POA: Diagnosis not present

## 2018-02-13 DIAGNOSIS — E119 Type 2 diabetes mellitus without complications: Secondary | ICD-10-CM | POA: Diagnosis not present

## 2018-02-13 DIAGNOSIS — H2513 Age-related nuclear cataract, bilateral: Secondary | ICD-10-CM | POA: Diagnosis not present

## 2018-02-13 LAB — HM DIABETES EYE EXAM

## 2018-02-13 NOTE — Telephone Encounter (Signed)
This encounter was created in error - please disregard.

## 2018-02-15 DIAGNOSIS — F3340 Major depressive disorder, recurrent, in remission, unspecified: Secondary | ICD-10-CM | POA: Diagnosis not present

## 2018-02-15 DIAGNOSIS — E785 Hyperlipidemia, unspecified: Secondary | ICD-10-CM | POA: Diagnosis not present

## 2018-02-15 DIAGNOSIS — I10 Essential (primary) hypertension: Secondary | ICD-10-CM | POA: Diagnosis not present

## 2018-02-15 DIAGNOSIS — E669 Obesity, unspecified: Secondary | ICD-10-CM | POA: Diagnosis not present

## 2018-02-15 DIAGNOSIS — H04123 Dry eye syndrome of bilateral lacrimal glands: Secondary | ICD-10-CM | POA: Diagnosis not present

## 2018-02-15 DIAGNOSIS — Z6830 Body mass index (BMI) 30.0-30.9, adult: Secondary | ICD-10-CM | POA: Diagnosis not present

## 2018-02-15 DIAGNOSIS — M545 Low back pain: Secondary | ICD-10-CM | POA: Diagnosis not present

## 2018-02-15 DIAGNOSIS — E1136 Type 2 diabetes mellitus with diabetic cataract: Secondary | ICD-10-CM | POA: Diagnosis not present

## 2018-02-20 ENCOUNTER — Other Ambulatory Visit: Payer: Self-pay | Admitting: *Deleted

## 2018-02-20 NOTE — Patient Outreach (Signed)
[facility]) Care Management  02/20/2018   [name] Barker 06-15-1961 [id number] RN Health Coach telephone call to patient.  Hipaa compliance verified. Per patient she is doing pretty good. Patient A1C is 6.5.  Per patient her fasting blood sugar is 128. Patient stated her weight is 164 and she is wanting to loose down to 150 pounds. RN discussed getting into a routine exercise program. RN went over facilities within her area. Patient stated she is going to call and look into them. Patient goal is to have a facility picked by next out reach call. RN will look at closing case next outreach. Patient has agreed to further outreach calls.   Current Medications:  Current Outpatient Medications  Medication Sig Dispense Refill  . aspirin EC 81 MG tablet Take 1 tablet (81 mg total) by mouth daily. 90 tablet 3  . lisinopril (PRINIVIL,ZESTRIL) 10 MG tablet Take 1 tablet (10 mg total) by mouth daily. 90 tablet 3  . metFORMIN (GLUCOPHAGE) 1000 MG tablet Take 1 tablet (1,000 mg total) by mouth 2 (two) times daily with a meal. 180 tablet 3  . Multiple Vitamin (MULTIVITAMIN WITH MINERALS) TABS tablet Take 1 tablet by mouth daily. 100 tablet 0  . Omega-3 Fatty Acids (FISH OIL) 1000 MG CAPS Take 1 capsule (1,000 mg total) by mouth daily. 100 capsule 1  . simvastatin (ZOCOR) 20 MG tablet Take 1 tablet (20 mg total) by mouth at bedtime. 90 tablet 3  . tetrahydrozoline 0.05 % ophthalmic solution Place 2 drops into both eyes daily as needed (for dry eyes).    . venlafaxine XR (EFFEXOR-XR) 75 MG 24 hr capsule TAKE 1 CAPSULE DAILY WITH BREAKFAST 90 capsule 3  . azithromycin (ZITHROMAX) 250 MG tablet Take 2 today then 1 daily (Patient not taking: Reported on 01/13/2018) 6 tablet 0  . Blood Glucose Calibration (ACCU-CHEK SMARTVIEW CONTROL) LIQD Use as directed 1 each 2  . Blood Glucose Monitoring Suppl (ACCU-CHEK NANO SMARTVIEW) w/Device KIT Use as directed for once daily testing of blood sugar. E11.9  1 kit 0  . tiZANidine (ZANAFLEX) 2 MG tablet Take 1 tablet by mouth every 6 (six) hours as needed.     No current facility-administered medications for this visit.     Functional Status:  In your present state of health, do you have any difficulty performing the following activities: 02/20/2018 11/28/2017  Hearing? N N  Vision? N N  Difficulty concentrating or making decisions? N N  Walking or climbing stairs? N N  Dressing or bathing? N N  Doing errands, shopping? N N  Preparing Food and eating ? N N  Using the Toilet? N N  In the past six months, have you accidently leaked urine? N N  Do you have problems with loss of bowel control? N N  Managing your Medications? Y Y  Comment - per patient the [facility] pharmacy has helped her get back on track with her medications  Managing your Finances? N N  Comment - -  Housekeeping or managing your Housekeeping? N N  Some recent data might be hidden    Fall/Depression Screening: Fall Risk  02/20/2018 01/13/2018 11/28/2017  Falls in the past year? No No No   PHQ 2/9 Scores 02/20/2018 01/13/2018 11/28/2017 10/17/2017 10/17/2017 10/03/2017 09/03/2017  PHQ - 2 Score 3 3 0 0 0 0 0  PHQ- 9 Score 8 8 - 2 1 - -   THN CM Care Plan Problem One     Most Recent Value  Care Plan Problem One  Knowledge Deficit in Self Management of Diabetes  Role Documenting the Problem One  [place] for Problem One  Active  THN Long Term Goal   Patient will be able to choose  healthier snacks within the next 90 days  Interventions for Problem One Long Term Goal  RN discusse what snacks the patient was eating/RN sent list of healthy snacks/ RN will follow uo with further discussion  THN CM Short Term Goal #1   Patient will report planning meals within her diet within the next 30 days  THN CM Short Term Goal #1 Met Date  02/20/18  Interventions for Short Term Goal #1  RN sent patient a list of food choices for dinning out/ RN will follow up with physician for  nutritional classes/ RN will follow up with further discussion and outreach  [facility] CM Short Term Goal #2   Patient will report checking blood sugars daily and documenting  THN CM Short Term Goal #2 Met Date  02/20/18  Interventions for Short Term Goal #2  RN sent educational material on why cehck blood sugars. RN discussed how blood sugars affect the A1C. RN will follow up with further outreach.  THN CM Short Term Goal #3  Patient will report looking into starting an routine exercise program within the next 30 days  THN CM Short Term Goal #3 Start Date  02/20/18  Interventions for Short Tern Goal #3  RN discussed with patient about exercise facilities in her area. RN discussed the importance of exercising and diabetes. RN will follow up with further discussion and compliance  THN CM Short Term Goal #4  Patient will look at portion control within the next 30 days  THN CM Short Term Goal #4 Start Date  02/20/18  Interventions for Short Term Goal #4  RN ent educational material on portion control and portion sizes. Patient is wanting to loose 14 pounds from 164 to 150. RN will follow up on progress.      Assessment:  A1C 6.5 Fasting Blood sugar 128 Patient has not started a routine exercise progra  Plan: RN discussed exercise facilities within the patient area RN discussed the importance of exercise RN sent EMMI on Diabetes and exercise RN sent EMMI on portion and serving sizes RN will follow up within the month of March RN will look at closing case at that time  [facility] [phone]

## 2018-02-26 ENCOUNTER — Ambulatory Visit
Admission: RE | Admit: 2018-02-26 | Discharge: 2018-02-26 | Disposition: A | Discharge status: Home / Self Care | Payer: Medicare PPO | Source: Ambulatory Visit | Attending: Internal Medicine | Admitting: Internal Medicine

## 2018-02-26 DIAGNOSIS — Z1231 Encounter for screening mammogram for malignant neoplasm of breast: Secondary | ICD-10-CM | POA: Diagnosis not present

## 2018-02-26 DIAGNOSIS — Z1239 Encounter for other screening for malignant neoplasm of breast: Secondary | ICD-10-CM

## 2018-05-26 ENCOUNTER — Encounter: Payer: Self-pay | Admitting: Internal Medicine

## 2018-05-26 ENCOUNTER — Ambulatory Visit: Payer: Medicare PPO | Attending: Internal Medicine | Admitting: Internal Medicine

## 2018-05-26 VITALS — BP 110/74 | HR 90 | Temp 97.9°F | Resp 16 | Wt 162.0 lb

## 2018-05-26 DIAGNOSIS — Z823 Family history of stroke: Secondary | ICD-10-CM | POA: Diagnosis not present

## 2018-05-26 DIAGNOSIS — Z8249 Family history of ischemic heart disease and other diseases of the circulatory system: Secondary | ICD-10-CM | POA: Diagnosis not present

## 2018-05-26 DIAGNOSIS — G4700 Insomnia, unspecified: Secondary | ICD-10-CM

## 2018-05-26 DIAGNOSIS — E119 Type 2 diabetes mellitus without complications: Secondary | ICD-10-CM | POA: Diagnosis not present

## 2018-05-26 DIAGNOSIS — E1122 Type 2 diabetes mellitus with diabetic chronic kidney disease: Secondary | ICD-10-CM | POA: Insufficient documentation

## 2018-05-26 DIAGNOSIS — Z7984 Long term (current) use of oral hypoglycemic drugs: Secondary | ICD-10-CM | POA: Insufficient documentation

## 2018-05-26 DIAGNOSIS — R05 Cough: Secondary | ICD-10-CM | POA: Diagnosis not present

## 2018-05-26 DIAGNOSIS — N183 Chronic kidney disease, stage 3 (moderate): Secondary | ICD-10-CM | POA: Insufficient documentation

## 2018-05-26 DIAGNOSIS — Z79899 Other long term (current) drug therapy: Secondary | ICD-10-CM | POA: Diagnosis not present

## 2018-05-26 DIAGNOSIS — Z7982 Long term (current) use of aspirin: Secondary | ICD-10-CM | POA: Insufficient documentation

## 2018-05-26 DIAGNOSIS — E1169 Type 2 diabetes mellitus with other specified complication: Secondary | ICD-10-CM | POA: Diagnosis not present

## 2018-05-26 DIAGNOSIS — I129 Hypertensive chronic kidney disease with stage 1 through stage 4 chronic kidney disease, or unspecified chronic kidney disease: Secondary | ICD-10-CM | POA: Insufficient documentation

## 2018-05-26 DIAGNOSIS — D649 Anemia, unspecified: Secondary | ICD-10-CM

## 2018-05-26 DIAGNOSIS — N289 Disorder of kidney and ureter, unspecified: Secondary | ICD-10-CM

## 2018-05-26 DIAGNOSIS — E785 Hyperlipidemia, unspecified: Secondary | ICD-10-CM | POA: Insufficient documentation

## 2018-05-26 DIAGNOSIS — I10 Essential (primary) hypertension: Secondary | ICD-10-CM | POA: Diagnosis not present

## 2018-05-26 DIAGNOSIS — Z9049 Acquired absence of other specified parts of digestive tract: Secondary | ICD-10-CM | POA: Insufficient documentation

## 2018-05-26 DIAGNOSIS — L219 Seborrheic dermatitis, unspecified: Secondary | ICD-10-CM | POA: Diagnosis not present

## 2018-05-26 DIAGNOSIS — E1136 Type 2 diabetes mellitus with diabetic cataract: Secondary | ICD-10-CM | POA: Diagnosis not present

## 2018-05-26 DIAGNOSIS — R059 Cough, unspecified: Secondary | ICD-10-CM

## 2018-05-26 LAB — POCT GLYCOSYLATED HEMOGLOBIN (HGB A1C): HBA1C, POC (PREDIABETIC RANGE): 5.9 % (ref 5.7–6.4)

## 2018-05-26 LAB — GLUCOSE, POCT (MANUAL RESULT ENTRY)
POC GLUCOSE: 85 mg/dL (ref 70–99)
POC Glucose: 71 mg/dl (ref 70–99)

## 2018-05-26 MED ORDER — LOSARTAN POTASSIUM 25 MG PO TABS: 25.0000 mg | ORAL_TABLET | Freq: Every day | ORAL | 2 refills | Status: DC

## 2018-05-26 MED ORDER — KETOCONAZOLE 2 % EX CREA: TOPICAL_CREAM | CUTANEOUS | 0 refills | Status: DC

## 2018-05-26 NOTE — Progress Notes (Signed)
Patient ID: [name] Barker, female    DOB: 1961-03-19  MRN: [id number]  CC: Diabetes and Hypertension   Subjective: [name] Barker is a 57 y.o. female who presents for chronic disease management. Her concerns today include:  Patient with history of DM, HL, HTN, perimenopausal  DM:  Checks BS once a day.  Highest 129, lowest 80.  BS in office 71 this am.  She has not eaten breakfast as yet for the morning.  No symptoms Tolerating Metformin Does a lot of walking for exercise.  She has not been able to get back to [employer] due to limited income Saw Dr. Corrin Parker in 01/2018.  Dx with cataract BL.  Observed for now  C/o a lot of dry cough over past 2 mths. + scratchy throat sometimes.  Occasional sneezing.  No sinus congestion.  No wheezing or SOB No lacrimation or itchy eyes.  Getting over a recent cold.   HL:  Tolerating Simvastatin without myalgias or arthralgias  CKD 3: Kidney function noted to be decreased on labs done in January compared to 1 year prior.  GFR was 58 compared to 71 a year ago.  Not on any NSAIDs.  She is making good urine.  Anemia noted on last labs.  No fatigue or dizziness  Problems staying asleep for over a year.  Usually gets in bed around 11 PM and it takes her about half an hour to fall asleep.  She sleeps with gospel music on with volume on low.  She also has a small night light in her room.  She sometimes to drink caffeinated tea or soda around 7 in the evenings.  Does not drink alcohol.  She sleeps for about 3 to 4 hours and then has a difficult time falling back asleep.  However when asked her to quantify she states that it takes about 30 to 45 minutes to fall back asleep again.  Denies any loud snoring   Patient Active Problem List   Diagnosis Date Noted  . 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 Perimenopausal 09/13/2016  . Hyperlipidemia associated with type 2 diabetes mellitus ([place]) 07/22/2014  . Esophageal reflux 07/22/2014  .  Diabetes ([place]) 04/22/2014  . Essential hypertension, benign 04/22/2014     Current Outpatient Medications on File Prior to Visit  Medication Sig Dispense Refill  . aspirin EC 81 MG tablet Take 1 tablet (81 mg total) by mouth daily. 90 tablet 3  . Blood Glucose Calibration (ACCU-CHEK SMARTVIEW CONTROL) LIQD Use as directed 1 each 2  . Blood Glucose Monitoring Suppl (ACCU-CHEK NANO SMARTVIEW) w/Device KIT Use as directed for once daily testing of blood sugar. E11.9 1 kit 0  . metFORMIN (GLUCOPHAGE) 1000 MG tablet Take 1 tablet (1,000 mg total) by mouth 2 (two) times daily with a meal. 180 tablet 3  . Multiple Vitamin (MULTIVITAMIN WITH MINERALS) TABS tablet Take 1 tablet by mouth daily. 100 tablet 0  . Omega-3 Fatty Acids (FISH OIL) 1000 MG CAPS Take 1 capsule (1,000 mg total) by mouth daily. 100 capsule 1  . simvastatin (ZOCOR) 20 MG tablet Take 1 tablet (20 mg total) by mouth at bedtime. 90 tablet 3  . tetrahydrozoline 0.05 % ophthalmic solution Place 2 drops into both eyes daily as needed (for dry eyes).    Marland Kitchen tiZANidine (ZANAFLEX) 2 MG tablet Take 1 tablet by mouth every 6 (six) hours as needed.    . venlafaxine XR (EFFEXOR-XR) 75 MG 24 hr capsule TAKE 1 CAPSULE  DAILY WITH BREAKFAST 90 capsule 3   No current facility-administered medications on file prior to visit.     No Known Allergies  Social History   Socioeconomic History  . Marital status: Single    Spouse name: Not on file  . Number of children: Not on file  . Years of education: Not on file  . Highest education level: Not on file  Occupational History  . Not on file  Social Needs  . Financial resource strain: Not on file  . Food insecurity:    Worry: Not on file    Inability: Not on file  . Transportation needs:    Medical: Not on file    Non-medical: Not on file  Tobacco Use  . Smoking status: Never Smoker  . Smokeless tobacco: Never Used  Substance and Sexual Activity  . Alcohol use: No  . Drug use: No  .  Sexual activity: Not on file  Lifestyle  . Physical activity:    Days per week: Not on file    Minutes per session: Not on file  . Stress: Not on file  Relationships  . Social connections:    Talks on phone: Not on file    Gets together: Not on file    Attends religious service: Not on file    Active member of club or organization: Not on file    Attends meetings of clubs or organizations: Not on file    Relationship status: Not on file  . Intimate partner violence:    Fear of current or ex partner: Not on file    Emotionally abused: Not on file    Physically abused: Not on file    Forced sexual activity: Not on file  Other Topics Concern  . 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     Family History  Problem Relation Age of Onset  . Hypertension Mother   . Hypertension Father   . Heart disease Father   . Stroke Other   . Diabetes Other   . Arthritis Other   . Hypertension Sister   . Hypertension Brother   . Colon cancer Neg Hx     Past Surgical History:  Procedure Laterality Date  . CHOLECYSTECTOMY      ROS: Review of Systems Negative except as stated above PHYSICAL EXAM: BP 110/74   Pulse 90   Temp 97.9 F (36.6 C) (Oral)   Resp 16   Wt 162 lb (73.5 kg)   LMP 02/21/2016   SpO2 100%   BMI 30.61 kg/m   Physical Exam General appearance - alert, well appearing, and in no distress Mental status - normal mood, behavior, speech, dress, motor activity, and thought processes Eyes - pupils equal and reactive, extraocular eye movements intact Nose - normal and patent, no erythema, discharge or polyps Mouth - mucous membranes moist, pharynx normal without lesions Neck - supple, no significant adenopathy Chest - clear to auscultation, no wheezes, rales or rhonchi, symmetric air entry Heart - normal rate, regular rhythm, normal S1, S2, no murmurs, rubs, clicks or gallops Extremities - peripheral pulses  normal, no pedal edema, no clubbing or cyanosis  Results for orders placed or performed in visit on 05/26/18  POCT glucose (manual entry)  Result Value Ref Range   POC Glucose 71 70 - 99 mg/dl  POCT glycosylated hemoglobin (Hb A1C)  Result Value Ref Range   Hemoglobin A1C  4.0 -  5.6 %   HbA1c, POC (prediabetic range) 5.9 5.7 - 6.4 %   HbA1c, POC (controlled diabetic range)  0.0 - 7.0 %  POCT glucose (manual entry)  Result Value Ref Range   POC Glucose 85 70 - 99 mg/dl     ASSESSMENT AND PLAN: 1. Type 2 diabetes mellitus without complication, without long-term current use of insulin ([place]) At goal Blood sugar was low this morning in the office.  Patient had not eaten breakfast as yet for the morning.  Given a cup of lemonade with increase of blood sugar to 85. Encouraged her to continue checking morning blood sugars.  If she is waking up with blood sugars below 80, I have asked her to let me know so that we can adjust her medication - POCT glucose (manual entry)  2. Hyperlipidemia associated with type 2 diabetes mellitus (HCC) - POCT glucose (manual entry) - POCT glycosylated hemoglobin (Hb A1C) - Microalbumin / creatinine urine ratio  3. Essential hypertension Her cough may be due to the ACE inhibitor.  Will change to losartan - losartan (COZAAR) 25 MG tablet; Take 1 tablet (25 mg total) by mouth daily. Stop Lisinopril  Dispense: 90 tablet; Refill: 2  4. Anemia, unspecified type - CBC - Iron, TIBC and Ferritin Panel  5. Seborrheic dermatitis - ketoconazole (NIZORAL) 2 % cream; Apply to affected area daily  Dispense: 30 g; Refill: 0  6. Insomnia, unspecified type Good sleep hygiene discussed in detail. Advised to get in bed around the same time every night, if unable to fall asleep after 30 minutes to get up and read or do something, turning off all lights and sounds including her gospel music.  I recommend turning off the night light and just turning on the lamp by her bed  when she needs to get up in the middle of the night. Avoid drinking any caffeinated beverages after 6 PM  7. Renal insufficiency - Basic metabolic panel  8. Cough Questionably due to ACE inhibitor versus allergies.  We will stop the lisinopril and put her on Cozaar instead.  If the cough persists I have told her to try Claritin and Flonase over-the-counter  Patient was given the opportunity to ask questions.  Patient verbalized understanding of the plan and was able to repeat key elements of the plan.   Orders Placed This Encounter  Procedures  . Microalbumin / creatinine urine ratio  . CBC  . Basic metabolic panel  . Iron, TIBC and Ferritin Panel  . POCT glucose (manual entry)  . POCT glycosylated hemoglobin (Hb A1C)  . POCT glucose (manual entry)     Requested Prescriptions   Signed Prescriptions Disp Refills  . losartan (COZAAR) 25 MG tablet 90 tablet 2    Sig: Take 1 tablet (25 mg total) by mouth daily. Stop Lisinopril  . ketoconazole (NIZORAL) 2 % cream 30 g 0    Sig: Apply to affected area daily    Return in about 3 months (around 08/26/2018).  Karle Plumber, MD, FACP

## 2018-05-26 NOTE — Patient Instructions (Signed)
Stop Lisinopril once you get the Losartan.  Start taking Losartan once daily for blood pressure.   Let me know if your blood sugars start falling below 80.  This will indicate the need to decrease the dose of Metformin.

## 2018-05-27 LAB — MICROALBUMIN / CREATININE URINE RATIO
Creatinine, Urine: 114.3 mg/dL
MICROALBUM., U, RANDOM: 7.7 ug/mL
Microalb/Creat Ratio: 6.7 mg/g creat (ref 0.0–30.0)

## 2018-05-27 LAB — IRON,TIBC AND FERRITIN PANEL
FERRITIN: 147 ng/mL (ref 15–150)
IRON SATURATION: 27 % (ref 15–55)
IRON: 72 ug/dL (ref 27–159)
TIBC: 263 ug/dL (ref 250–450)
UIBC: 191 ug/dL (ref 131–425)

## 2018-05-27 LAB — BASIC METABOLIC PANEL
BUN / CREAT RATIO: 14 (ref 9–23)
BUN: 23 mg/dL (ref 6–24)
CO2: 19 mmol/L — ABNORMAL LOW (ref 20–29)
CREATININE: 1.62 mg/dL — AB (ref 0.57–1.00)
Calcium: 9.3 mg/dL (ref 8.7–10.2)
Chloride: 102 mmol/L (ref 96–106)
GFR calc Af Amer: 40 mL/min/{1.73_m2} — ABNORMAL LOW (ref >59)
GFR calc non Af Amer: 35 mL/min/{1.73_m2} — ABNORMAL LOW (ref >59)
GLUCOSE: 109 mg/dL — AB (ref 65–99)
Potassium: 4.3 mmol/L (ref 3.5–5.2)
Sodium: 136 mmol/L (ref 134–144)

## 2018-05-27 LAB — CBC
Hematocrit: 33.4 % — ABNORMAL LOW (ref 34.0–46.6)
Hemoglobin: 11.1 g/dL (ref 11.1–15.9)
MCH: 28.3 pg (ref 26.6–33.0)
MCHC: 33.2 g/dL (ref 31.5–35.7)
MCV: 85 fL (ref 79–97)
Platelets: 312 10*3/uL (ref 150–450)
RBC: 3.92 x10E6/uL (ref 3.77–5.28)
RDW: 13.5 % (ref 12.3–15.4)
WBC: 7.6 10*3/uL (ref 3.4–10.8)

## 2018-05-27 NOTE — Progress Notes (Signed)
Lab note sent to CMA:  Let patient know that mild anemia is stable compared to 4 months ago.  Her kidney function has continued to decline.  I recommend decreasing metformin to 1 tablet a day.  Avoid taking any over-the-counter pain medications except Tylenol.  Please return to the lab in 2 weeks for recheck of kidney function with blood draw.  If still not improved at that time, we will refer her to a kidney specialist.  Results for orders placed or performed in visit on 05/26/18  CBC  Result Value Ref Range   WBC 7.6 3.4 - 10.8 x10E3/uL   RBC 3.92 3.77 - 5.28 x10E6/uL   Hemoglobin 11.1 11.1 - 15.9 g/dL   Hematocrit 56.233.4 (L) 13.034.0 - 46.6 %   MCV 85 79 - 97 fL   MCH 28.3 26.6 - 33.0 pg   MCHC 33.2 31.5 - 35.7 g/dL   RDW 86.513.5 78.412.3 - 69.615.4 %   Platelets 312 150 - 450 x10E3/uL  Basic metabolic panel  Result Value Ref Range   Glucose 109 (H) 65 - 99 mg/dL   BUN 23 6 - 24 mg/dL   Creatinine, Ser 2.951.62 (H) 0.57 - 1.00 mg/dL   GFR calc non Af Amer 35 (L) >59 mL/min/1.73   GFR calc Af Amer 40 (L) >59 mL/min/1.73   BUN/Creatinine Ratio 14 9 - 23   Sodium 136 134 - 144 mmol/L   Potassium 4.3 3.5 - 5.2 mmol/L   Chloride 102 96 - 106 mmol/L   CO2 19 (L) 20 - 29 mmol/L   Calcium 9.3 8.7 - 10.2 mg/dL  Iron, TIBC and Ferritin Panel  Result Value Ref Range   Total Iron Binding Capacity 263 250 - 450 ug/dL   UIBC 284191 132131 - 440425 ug/dL   Iron 72 27 - 102159 ug/dL   Iron Saturation 27 15 - 55 %   Ferritin 147 15 - 150 ng/mL  POCT glucose (manual entry)  Result Value Ref Range   POC Glucose 71 70 - 99 mg/dl  POCT glycosylated hemoglobin (Hb A1C)  Result Value Ref Range   Hemoglobin A1C  4.0 - 5.6 %   HbA1c, POC (prediabetic range) 5.9 5.7 - 6.4 %   HbA1c, POC (controlled diabetic range)  0.0 - 7.0 %  POCT glucose (manual entry)  Result Value Ref Range   POC Glucose 85 70 - 99 mg/dl

## 2018-05-27 NOTE — Addendum Note (Signed)
Addended by: Jonah BlueJOHNSON, Cammeron Greis B on: 05/27/2018 08:12 AM   Modules accepted: Orders

## 2018-05-28 ENCOUNTER — Telehealth: Payer: Self-pay

## 2018-05-28 NOTE — Telephone Encounter (Signed)
Contacted pt to go over lab results pt is aware of results and doesn't have any questions or concerns 

## 2018-06-05 ENCOUNTER — Other Ambulatory Visit: Payer: Self-pay | Admitting: *Deleted

## 2018-06-05 NOTE — Patient Outreach (Signed)
[facility]) Care Management  06/05/2018   [name] Barker Jan 24, 1961 [id number]  RN Health Coach telephone call to patient.  [employer] compliance verified. Per patient she is doing good. Patient A1C is 5.9. Per patient she is walking everyday. Patient planing to go to an exercise facility.Patient is using portion control. Patient is taking medications as per ordered. At this time [place] discussed with patient about meeting her goal. [place] coach will close case and made patient aware that she can call if any needs arise.                                                                                                                                                                                                                                                                                                                                                                                                                                                                                                                     Current Medications:  Current Outpatient Medications  Medication  Sig Dispense Refill  . aspirin EC 81 MG tablet Take 1 tablet (81 mg total) by mouth daily. 90 tablet 3  . Blood Glucose Calibration (ACCU-CHEK SMARTVIEW CONTROL) LIQD Use as directed 1 each 2  . Blood Glucose Monitoring Suppl (ACCU-CHEK NANO SMARTVIEW) w/Device KIT Use as directed for once daily testing of blood sugar. E11.9 1 kit 0  . ketoconazole (NIZORAL) 2 % cream Apply to affected area daily 30 g 0  . losartan (COZAAR) 25 MG tablet Take 1 tablet (25 mg total) by mouth daily. Stop Lisinopril 90 tablet 2  . metFORMIN (GLUCOPHAGE) 1000 MG tablet Take 1 tablet (1,000 mg total) by mouth 2 (two) times daily with a meal. 180 tablet 3  . Multiple Vitamin (MULTIVITAMIN WITH MINERALS) TABS tablet Take 1 tablet by mouth daily. 100 tablet 0  . Omega-3 Fatty Acids (FISH OIL) 1000 MG CAPS Take 1 capsule  (1,000 mg total) by mouth daily. 100 capsule 1  . simvastatin (ZOCOR) 20 MG tablet Take 1 tablet (20 mg total) by mouth at bedtime. 90 tablet 3  . tetrahydrozoline 0.05 % ophthalmic solution Place 2 drops into both eyes daily as needed (for dry eyes).    Marland Kitchen tiZANidine (ZANAFLEX) 2 MG tablet Take 1 tablet by mouth every 6 (six) hours as needed.    . venlafaxine XR (EFFEXOR-XR) 75 MG 24 hr capsule TAKE 1 CAPSULE DAILY WITH BREAKFAST 90 capsule 3   No current facility-administered medications for this visit.     Functional Status:  In your present state of health, do you have any difficulty performing the following activities: 02/20/2018 11/28/2017  Hearing? N N  Vision? N N  Difficulty concentrating or making decisions? N N  Walking or climbing stairs? N N  Dressing or bathing? N N  Doing errands, shopping? N N  Preparing Food and eating ? N N  Using the Toilet? N N  In the past six months, have you accidently leaked urine? N N  Do you have problems with loss of bowel control? N N  Managing your Medications? Y Y  Comment - per patient the [facility] pharmacy has helped her get back on track with her medications  Managing your Finances? N N  Comment - -  Housekeeping or managing your Housekeeping? N N  Some recent data might be hidden    Fall/Depression Screening: Fall Risk  02/20/2018 01/13/2018 11/28/2017  Falls in the past year? No No No   PHQ 2/9 Scores 05/26/2018 02/20/2018 01/13/2018 11/28/2017 10/17/2017 10/17/2017 10/03/2017  PHQ - 2 Score 0 3 3 0 0 0 0  PHQ- 9 Score - 8 8 - 2 1 -   [facility] CM Care Plan Problem One     Most Recent Value  Care Plan Problem One  Knowledge Deficit in Self Management of Diabetes  Role Documenting the Problem One  [place] for Problem One  Active  [facility] CM Short Term Goal #3  Patient will report looking into starting an routine exercise program within the next 30 days  [facility] CM Short Term Goal #3 Met Date  06/05/18  [facility] CM Short Term Goal #4 Met Date   06/05/18      Assessment:  Patient has met her health care goals A1C 5.9 Patient is trying to eat healthier Patient is using portion control  Plan:  RN will notify physician goal met RN sent patient closure letter and goals met RN sent EMMI education on Taking care of yourself day  to day and throughout the year  [place] Management [phone]

## 2018-08-29 ENCOUNTER — Ambulatory Visit: Payer: Medicare PPO | Attending: Internal Medicine | Admitting: Internal Medicine

## 2018-08-29 ENCOUNTER — Encounter: Payer: Self-pay | Admitting: Internal Medicine

## 2018-08-29 VITALS — BP 130/80 | HR 78 | Temp 98.0°F | Resp 16 | Wt 172.6 lb

## 2018-08-29 DIAGNOSIS — Z7984 Long term (current) use of oral hypoglycemic drugs: Secondary | ICD-10-CM | POA: Insufficient documentation

## 2018-08-29 DIAGNOSIS — Z9049 Acquired absence of other specified parts of digestive tract: Secondary | ICD-10-CM | POA: Insufficient documentation

## 2018-08-29 DIAGNOSIS — E785 Hyperlipidemia, unspecified: Secondary | ICD-10-CM | POA: Diagnosis not present

## 2018-08-29 DIAGNOSIS — Z23 Encounter for immunization: Secondary | ICD-10-CM | POA: Insufficient documentation

## 2018-08-29 DIAGNOSIS — E119 Type 2 diabetes mellitus without complications: Secondary | ICD-10-CM

## 2018-08-29 DIAGNOSIS — Z833 Family history of diabetes mellitus: Secondary | ICD-10-CM | POA: Insufficient documentation

## 2018-08-29 DIAGNOSIS — I10 Essential (primary) hypertension: Secondary | ICD-10-CM

## 2018-08-29 DIAGNOSIS — E1122 Type 2 diabetes mellitus with diabetic chronic kidney disease: Secondary | ICD-10-CM | POA: Diagnosis not present

## 2018-08-29 DIAGNOSIS — D649 Anemia, unspecified: Secondary | ICD-10-CM | POA: Diagnosis not present

## 2018-08-29 DIAGNOSIS — N183 Chronic kidney disease, stage 3 unspecified: Secondary | ICD-10-CM

## 2018-08-29 DIAGNOSIS — Z8249 Family history of ischemic heart disease and other diseases of the circulatory system: Secondary | ICD-10-CM | POA: Insufficient documentation

## 2018-08-29 DIAGNOSIS — Z79899 Other long term (current) drug therapy: Secondary | ICD-10-CM | POA: Insufficient documentation

## 2018-08-29 DIAGNOSIS — I129 Hypertensive chronic kidney disease with stage 1 through stage 4 chronic kidney disease, or unspecified chronic kidney disease: Secondary | ICD-10-CM | POA: Insufficient documentation

## 2018-08-29 DIAGNOSIS — Z7982 Long term (current) use of aspirin: Secondary | ICD-10-CM | POA: Insufficient documentation

## 2018-08-29 DIAGNOSIS — Z823 Family history of stroke: Secondary | ICD-10-CM | POA: Insufficient documentation

## 2018-08-29 LAB — GLUCOSE, POCT (MANUAL RESULT ENTRY): POC GLUCOSE: 81 mg/dL (ref 70–99)

## 2018-08-29 NOTE — Progress Notes (Signed)
Patient ID: [name] Barker, female    DOB: 04-24-1961  MRN: [id number]  CC: Diabetes and Hypertension   Subjective: [name] Barker is a 57 y.o. female who presents for chronic ds management.  Her concerns today include:  Patient with history of DM, HL, HTN, perimenopausal, anemia, CKD stage 3  CKD stage 3:  Kidney function had declined on lab test done on last visit.  Patient was told to decrease metformin from 1 g twice daily to 1 g daily.  She was to return to the lab for repeat chemistry in 1 week but forgot to do so.  She has not been taking any NSAIDs.  Denies any lower extremity edema.  No rashes.     DM: Metformin dec to 1 gram daily due to kidney function.  Meter not working to check blood sugars. Eating habits:  Doing good; eat in moderation.  Drinks water, milk and diet sodas.  Walks to a nearby neighborhood store several times a wk  HTN:  No device to check.  Compliant with Losartan.  Limits salt No CP/SOB/LE edema  HL:  Tolerating Simvastatin.   Anemia:  stable based on labs.  Patient Active Problem List   Diagnosis Date Noted  . 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 Perimenopausal 09/13/2016  . Hyperlipidemia associated with type 2 diabetes mellitus ([place]) 07/22/2014  . Esophageal reflux 07/22/2014  . Diabetes ([place]) 04/22/2014  . Essential hypertension, benign 04/22/2014     Current Outpatient Medications on File Prior to Visit  Medication Sig Dispense Refill  . aspirin EC 81 MG tablet Take 1 tablet (81 mg total) by mouth daily. 90 tablet 3  . Blood Glucose Calibration (ACCU-CHEK SMARTVIEW CONTROL) LIQD Use as directed 1 each 2  . Blood Glucose Monitoring Suppl (ACCU-CHEK NANO SMARTVIEW) w/Device KIT Use as directed for once daily testing of blood sugar. E11.9 1 kit 0  . ketoconazole (NIZORAL) 2 % cream Apply to affected area daily 30 g 0  . losartan (COZAAR) 25 MG tablet Take 1 tablet (25 mg total) by mouth daily. Stop Lisinopril 90 tablet 2  .  metFORMIN (GLUCOPHAGE) 1000 MG tablet Take 1 tablet (1,000 mg total) by mouth 2 (two) times daily with a meal. 180 tablet 3  . Multiple Vitamin (MULTIVITAMIN WITH MINERALS) TABS tablet Take 1 tablet by mouth daily. 100 tablet 0  . Omega-3 Fatty Acids (FISH OIL) 1000 MG CAPS Take 1 capsule (1,000 mg total) by mouth daily. 100 capsule 1  . simvastatin (ZOCOR) 20 MG tablet Take 1 tablet (20 mg total) by mouth at bedtime. 90 tablet 3  . tetrahydrozoline 0.05 % ophthalmic solution Place 2 drops into both eyes daily as needed (for dry eyes).    Marland Kitchen tiZANidine (ZANAFLEX) 2 MG tablet Take 1 tablet by mouth every 6 (six) hours as needed.    . venlafaxine XR (EFFEXOR-XR) 75 MG 24 hr capsule TAKE 1 CAPSULE DAILY WITH BREAKFAST 90 capsule 3   No current facility-administered medications on file prior to visit.     No Known Allergies  Social History   Socioeconomic History  . Marital status: Single    Spouse name: Not on file  . Number of children: Not on file  . Years of education: Not on file  . Highest education level: Not on file  Occupational History  . Not on file  Social Needs  . Financial resource strain: Not on file  . Food insecurity:    Worry: Not on file  Inability: Not on file  . Transportation needs:    Medical: Not on file    Non-medical: Not on file  Tobacco Use  . Smoking status: Never Smoker  . Smokeless tobacco: Never Used  Substance and Sexual Activity  . Alcohol use: No  . Drug use: No  . Sexual activity: Not on file  Lifestyle  . Physical activity:    Days per week: Not on file    Minutes per session: Not on file  . Stress: Not on file  Relationships  . Social connections:    Talks on phone: Not on file    Gets together: Not on file    Attends religious service: Not on file    Active member of club or organization: Not on file    Attends meetings of clubs or organizations: Not on file    Relationship status: Not on file  . Intimate partner violence:     Fear of current or ex partner: Not on file    Emotionally abused: Not on file    Physically abused: Not on file    Forced sexual activity: Not on file  Other Topics Concern  . 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     Family History  Problem Relation Age of Onset  . Hypertension Mother   . Hypertension Father   . Heart disease Father   . Stroke Other   . Diabetes Other   . Arthritis Other   . Hypertension Sister   . Hypertension Brother   . Colon cancer Neg Hx     Past Surgical History:  Procedure Laterality Date  . CHOLECYSTECTOMY      ROS: Review of Systems Negative except as above. PHYSICAL EXAM: BP 130/80   Pulse 78   Temp 98 F (36.7 C) (Oral)   Resp 16   Wt 172 lb 9.6 oz (78.3 kg)   LMP 02/21/2016   SpO2 100%   BMI 32.61 kg/m   Physical Exam  General appearance - alert, well appearing, middle-aged African-American female and in no distress Mental status - normal mood, behavior, speech, dress, motor activity, and thought processes Mouth - mucous membranes moist, pharynx normal without lesions Neck - supple, no significant adenopathy Chest - clear to auscultation, no wheezes, rales or rhonchi, symmetric air entry Heart - normal rate, regular rhythm, normal S1, S2, no murmurs, rubs, clicks or gallops Extremities - peripheral pulses normal, no pedal edema, no clubbing or cyanosis Diabetic Foot Exam - Simple   Simple Foot Form Visual Inspection No deformities, no ulcerations, no other skin breakdown bilaterally:  Yes Sensation Testing Intact to touch and monofilament testing bilaterally:  Yes Pulse Check Posterior Tibialis and Dorsalis pulse intact bilaterally:  Yes Comments     Results for orders placed or performed in visit on 08/29/18  POCT glucose (manual entry)  Result Value Ref Range   POC Glucose 81 70 - 99 mg/dl   Lab Results  Component Value Date   HGBA1C 5.9 05/26/2018      Chemistry      Component Value Date/Time   NA 136 05/26/2018 1151   K 4.3 05/26/2018 1151   CL 102 05/26/2018 1151   CO2 19 (L) 05/26/2018 1151   BUN 23 05/26/2018 1151   CREATININE 1.62 (H) 05/26/2018 1151   CREATININE 0.94 12/27/2015 1114      Component Value Date/Time   CALCIUM 9.3 05/26/2018 1151  ALKPHOS 70 01/13/2018 1147   AST 13 01/13/2018 1147   ALT 15 01/13/2018 1147   BILITOT 0.4 01/13/2018 1147     Lab Results  Component Value Date   WBC 7.6 05/26/2018   HGB 11.1 05/26/2018   HCT 33.4 (L) 05/26/2018   MCV 85 05/26/2018   PLT 312 05/26/2018   Lab Results  Component Value Date   IRON 72 05/26/2018   TIBC 263 05/26/2018   FERRITIN 147 05/26/2018     ASSESSMENT AND PLAN: 1. Type 2 diabetes mellitus without complication, without long-term current use of insulin (HCC) Blood sugar okay today.  We will check her A1c especially since we lowered the dose of metformin since last visit.  Encourage continued regular exercise activity - POCT glucose (manual entry) - Hemoglobin A1c  2. Need for immunization against influenza - Flu Vaccine QUAD 36+ mos IM  3. Essential hypertension At goal.  Continue DASH diet and Cozaar.  4. Anemia, unspecified type Stable.  Iron studies okay.  5. CKD (chronic kidney disease) stage 3, GFR 30-59 ml/min (HCC) Avoid NSAIDs.  Plan to recheck chemistry today with a urinalysis - Basic metabolic panel - Urinalysis   Patient was given the opportunity to ask questions.  Patient verbalized understanding of the plan and was able to repeat key elements of the plan.   Orders Placed This Encounter  Procedures  . Flu Vaccine QUAD 36+ mos IM  . Basic metabolic panel  . Urinalysis  . Hemoglobin A1c  . POCT glucose (manual entry)     Requested Prescriptions    No prescriptions requested or ordered in this encounter    Return in about 3 months (around 11/28/2018).  Karle Plumber, MD, FACP

## 2018-08-29 NOTE — Patient Instructions (Addendum)
Please consider changing to 1% or 2% milk. Continue healthy eating habits and regular exercise.    Influenza Virus Vaccine injection (Fluarix) What is this medicine? INFLUENZA VIRUS VACCINE (in floo EN zuh VAHY ruhs vak SEEN) helps to reduce the risk of getting influenza also known as the flu. This medicine may be used for other purposes; ask your health care provider or pharmacist if you have questions. COMMON BRAND NAME(S): Fluarix, Fluzone What should I tell my health care provider before I take this medicine? They need to know if you have any of these conditions: -bleeding disorder like hemophilia -fever or infection -Guillain-Barre syndrome or other neurological problems -immune system problems -infection with the human immunodeficiency virus (HIV) or AIDS -low blood platelet counts -multiple sclerosis -an unusual or allergic reaction to influenza virus vaccine, eggs, chicken proteins, latex, gentamicin, other medicines, foods, dyes or preservatives -pregnant or trying to get pregnant -breast-feeding How should I use this medicine? This vaccine is for injection into a muscle. It is given by a health care professional. A copy of Vaccine Information Statements will be given before each vaccination. Read this sheet carefully each time. The sheet may change frequently. Talk to your pediatrician regarding the use of this medicine in children. Special care may be needed. Overdosage: If you think you have taken too much of this medicine contact a poison control center or emergency room at once. NOTE: This medicine is only for you. Do not share this medicine with others. What if I miss a dose? This does not apply. What may interact with this medicine? -chemotherapy or radiation therapy -medicines that lower your immune system like etanercept, anakinra, infliximab, and adalimumab -medicines that treat or prevent blood clots like warfarin -phenytoin -steroid medicines like prednisone or  cortisone -theophylline -vaccines This list may not describe all possible interactions. Give your health care provider a list of all the medicines, herbs, non-prescription drugs, or dietary supplements you use. Also tell them if you smoke, drink alcohol, or use illegal drugs. Some items may interact with your medicine. What should I watch for while using this medicine? Report any side effects that do not go away within 3 days to your doctor or health care professional. Call your health care provider if any unusual symptoms occur within 6 weeks of receiving this vaccine. You may still catch the flu, but the illness is not usually as bad. You cannot get the flu from the vaccine. The vaccine will not protect against colds or other illnesses that may cause fever. The vaccine is needed every year. What side effects may I notice from receiving this medicine? Side effects that you should report to your doctor or health care professional as soon as possible: -allergic reactions like skin rash, itching or hives, swelling of the face, lips, or tongue Side effects that usually do not require medical attention (report to your doctor or health care professional if they continue or are bothersome): -fever -headache -muscle aches and pains -pain, tenderness, redness, or swelling at site where injected -weak or tired This list may not describe all possible side effects. Call your doctor for medical advice about side effects. You may report side effects to FDA at 1-800-FDA-1088. Where should I keep my medicine? This vaccine is only given in a clinic, pharmacy, doctor's office, or other health care setting and will not be stored at home. NOTE: This sheet is a summary. It may not cover all possible information. If you have questions about this medicine, talk to  your doctor, pharmacist, or health care provider.  2018 Elsevier/Gold Standard (2008-07-07 09:30:40)

## 2018-08-30 LAB — BASIC METABOLIC PANEL
BUN / CREAT RATIO: 14 (ref 9–23)
BUN: 15 mg/dL (ref 6–24)
CHLORIDE: 102 mmol/L (ref 96–106)
CO2: 24 mmol/L (ref 20–29)
Calcium: 10 mg/dL (ref 8.7–10.2)
Creatinine, Ser: 1.08 mg/dL — ABNORMAL HIGH (ref 0.57–1.00)
GFR calc Af Amer: 66 mL/min/{1.73_m2} (ref >59)
GFR calc non Af Amer: 57 mL/min/{1.73_m2} — ABNORMAL LOW (ref >59)
GLUCOSE: 91 mg/dL (ref 65–99)
Potassium: 4.5 mmol/L (ref 3.5–5.2)
SODIUM: 142 mmol/L (ref 134–144)

## 2018-08-30 LAB — URINALYSIS
Bilirubin, UA: NEGATIVE
Glucose, UA: NEGATIVE
KETONES UA: NEGATIVE
Leukocytes, UA: NEGATIVE
NITRITE UA: NEGATIVE
PROTEIN UA: NEGATIVE
RBC UA: NEGATIVE
SPEC GRAV UA: 1.013 (ref 1.005–1.030)
Urobilinogen, Ur: 0.2 mg/dL (ref 0.2–1.0)
pH, UA: 6 (ref 5.0–7.5)

## 2018-08-30 LAB — HEMOGLOBIN A1C
Est. average glucose Bld gHb Est-mCnc: 126 mg/dL
Hgb A1c MFr Bld: 6 % — ABNORMAL HIGH (ref 4.8–5.6)

## 2018-09-03 ENCOUNTER — Telehealth: Payer: Self-pay

## 2018-09-03 NOTE — Telephone Encounter (Signed)
Contacted pt to go over lab results pt is didn't answer left a detailed vm informing pt of results and if she has any questions or concerns to give me a call  If pt calls back please give results: Kidney function has improved. A1C is 6 indicating that DM is well controlled.

## 2018-11-28 ENCOUNTER — Ambulatory Visit: Payer: Medicare PPO | Attending: Internal Medicine | Admitting: Internal Medicine

## 2018-11-28 ENCOUNTER — Encounter: Payer: Self-pay | Admitting: Internal Medicine

## 2018-11-28 VITALS — BP 151/84 | HR 85 | Temp 98.2°F | Resp 16 | Wt 182.6 lb

## 2018-11-28 DIAGNOSIS — Z7982 Long term (current) use of aspirin: Secondary | ICD-10-CM | POA: Diagnosis not present

## 2018-11-28 DIAGNOSIS — E1122 Type 2 diabetes mellitus with diabetic chronic kidney disease: Secondary | ICD-10-CM | POA: Diagnosis not present

## 2018-11-28 DIAGNOSIS — E785 Hyperlipidemia, unspecified: Secondary | ICD-10-CM | POA: Insufficient documentation

## 2018-11-28 DIAGNOSIS — Z6834 Body mass index (BMI) 34.0-34.9, adult: Secondary | ICD-10-CM | POA: Diagnosis not present

## 2018-11-28 DIAGNOSIS — G4700 Insomnia, unspecified: Secondary | ICD-10-CM | POA: Diagnosis not present

## 2018-11-28 DIAGNOSIS — D649 Anemia, unspecified: Secondary | ICD-10-CM | POA: Insufficient documentation

## 2018-11-28 DIAGNOSIS — E1169 Type 2 diabetes mellitus with other specified complication: Secondary | ICD-10-CM

## 2018-11-28 DIAGNOSIS — Z9049 Acquired absence of other specified parts of digestive tract: Secondary | ICD-10-CM | POA: Insufficient documentation

## 2018-11-28 DIAGNOSIS — I10 Essential (primary) hypertension: Secondary | ICD-10-CM

## 2018-11-28 DIAGNOSIS — E669 Obesity, unspecified: Secondary | ICD-10-CM | POA: Diagnosis not present

## 2018-11-28 DIAGNOSIS — I129 Hypertensive chronic kidney disease with stage 1 through stage 4 chronic kidney disease, or unspecified chronic kidney disease: Secondary | ICD-10-CM | POA: Insufficient documentation

## 2018-11-28 DIAGNOSIS — Z7984 Long term (current) use of oral hypoglycemic drugs: Secondary | ICD-10-CM | POA: Insufficient documentation

## 2018-11-28 DIAGNOSIS — E119 Type 2 diabetes mellitus without complications: Secondary | ICD-10-CM

## 2018-11-28 DIAGNOSIS — N182 Chronic kidney disease, stage 2 (mild): Secondary | ICD-10-CM | POA: Diagnosis not present

## 2018-11-28 DIAGNOSIS — Z8249 Family history of ischemic heart disease and other diseases of the circulatory system: Secondary | ICD-10-CM | POA: Insufficient documentation

## 2018-11-28 LAB — POCT GLYCOSYLATED HEMOGLOBIN (HGB A1C): HBA1C, POC (CONTROLLED DIABETIC RANGE): 6.7 % (ref 0.0–7.0)

## 2018-11-28 LAB — GLUCOSE, POCT (MANUAL RESULT ENTRY): POC Glucose: 124 mg/dl — AB (ref 70–99)

## 2018-11-28 MED ORDER — LOSARTAN POTASSIUM 50 MG PO TABS: 50.0000 mg | ORAL_TABLET | Freq: Every day | ORAL | 1 refills | Status: DC

## 2018-11-28 NOTE — Patient Instructions (Signed)
Your blood pressure is not controlled.  We have increase Cozaar from 25 mg daily to 50 mg daily.  Continue to limit the salt in the foods.  Try to get back into your exercise routine.

## 2018-11-28 NOTE — Progress Notes (Signed)
Patient ID: [name] Barker, female    DOB: Mar 29, 1961  MRN: [id number]  CC: Diabetes   Subjective: [name] Barker is a 57 y.o. female who presents for chronic ds management Her concerns today include:  Patient with history ofDM, HL, HTN, perimenopausal, anemia, CKD stage 2-3  DIABETES TYPE 2 Last A1C:   Results for orders placed or performed in visit on 11/28/18  POCT glucose (manual entry)  Result Value Ref Range   POC Glucose 124 (A) 70 - 99 mg/dl  POCT glycosylated hemoglobin (Hb A1C)  Result Value Ref Range   Hemoglobin A1C     HbA1c POC (<> result, manual entry)     HbA1c, POC (prediabetic range)     HbA1c, POC (controlled diabetic range) 6.7 0.0 - 7.0 %    Med Adherence:  [x] Yes    [] No Medication side effects:  [] Yes    [x] No Home Monitoring?  [x] Yes, once a day after a meal. She has her meter with her.    Home glucose results range: 150-170 post meals Diet Adherence: [x] Yes    [] No Exercise: [] Yes    [x] No, use to walk but not recently.  Gained 10 lbs since last visit Hypoglycemic episodes?: [] Yes    [x] No Numbness of the feet? [] Yes    [x] No.  Some numbness in fingers where she pricks herself Retinopathy hx? [] Yes    [x] No Last eye exam:  01/2018.  Has cataracts Comments:   HYPERTENSION Currently taking: see medication list Med Adherence: [x] Yes    [] No Medication side effects: [] Yes    [x] No Adherence with salt restriction: [x] Yes    [] No Home Monitoring?: [] Yes    [x] No, no device to check SOB? [] Yes    [x] No Chest Pain?: [] Yes    [x] No Leg swelling?: [] Yes    [x] No Headaches?: [] Yes    [x] No Dizziness? [] Yes    [x] No Comments:   Insomnia:  Still has a "little trouble with sleeping."  Sometimes she only gets in 4 hrs of sleep.  Gets in bed about 11 PM.  Takes about 1 hr to fall asleep -Has a hard time relaxing.  Sometimes she still sleeps with music on.  Usually turns off television.  She drinks diet sodas in the  evenings but she is not sure whether it is caffeinated. Using Melatonin OTC which helps.  She takes it about every other night Patient Active Problem List   Diagnosis Date Noted  . 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 Perimenopausal 09/13/2016  . Hyperlipidemia associated with type 2 diabetes mellitus ([place]) 07/22/2014  . Esophageal reflux 07/22/2014  . Diabetes ([place]) 04/22/2014  . Essential hypertension, benign 04/22/2014     Current Outpatient Medications on File Prior to Visit  Medication Sig Dispense Refill  . aspirin EC 81 MG tablet Take 1 tablet (81 mg total) by mouth daily. 90 tablet 3  . Blood Glucose Calibration (ACCU-CHEK SMARTVIEW CONTROL) LIQD Use as directed 1 each 2  . Blood Glucose Monitoring Suppl (ACCU-CHEK NANO SMARTVIEW) w/Device KIT Use as directed for once daily testing of blood sugar. E11.9 1 kit 0  . ketoconazole (NIZORAL) 2 % cream Apply to affected area daily 30 g 0  . metFORMIN (GLUCOPHAGE) 1000 MG tablet Take 1 tablet (1,000 mg total) by mouth 2 (two) times daily  with a meal. 180 tablet 3  . Multiple Vitamin (MULTIVITAMIN WITH MINERALS) TABS tablet Take 1 tablet by mouth daily. 100 tablet 0  . Omega-3 Fatty Acids (FISH OIL) 1000 MG CAPS Take 1 capsule (1,000 mg total) by mouth daily. 100 capsule 1  . simvastatin (ZOCOR) 20 MG tablet Take 1 tablet (20 mg total) by mouth at bedtime. 90 tablet 3  . tetrahydrozoline 0.05 % ophthalmic solution Place 2 drops into both eyes daily as needed (for dry eyes).    Marland Kitchen tiZANidine (ZANAFLEX) 2 MG tablet Take 1 tablet by mouth every 6 (six) hours as needed.    . venlafaxine XR (EFFEXOR-XR) 75 MG 24 hr capsule TAKE 1 CAPSULE DAILY WITH BREAKFAST 90 capsule 3   No current facility-administered medications on file prior to visit.     No Known Allergies  Social History   Socioeconomic History  . Marital status: Single    Spouse name: Not on file  . Number of children: Not on file  . Years of education: Not on file    . Highest education level: Not on file  Occupational History  . Not on file  Social Needs  . Financial resource strain: Not on file  . Food insecurity:    Worry: Not on file    Inability: Not on file  . Transportation needs:    Medical: Not on file    Non-medical: Not on file  Tobacco Use  . Smoking status: Never Smoker  . Smokeless tobacco: Never Used  Substance and Sexual Activity  . Alcohol use: No  . Drug use: No  . Sexual activity: Not on file  Lifestyle  . Physical activity:    Days per week: Not on file    Minutes per session: Not on file  . Stress: Not on file  Relationships  . Social connections:    Talks on phone: Not on file    Gets together: Not on file    Attends religious service: Not on file    Active member of club or organization: Not on file    Attends meetings of clubs or organizations: Not on file    Relationship status: Not on file  . Intimate partner violence:    Fear of current or ex partner: Not on file    Emotionally abused: Not on file    Physically abused: Not on file    Forced sexual activity: Not on file  Other Topics Concern  . 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     Family History  Problem Relation Age of Onset  . Hypertension Mother   . Hypertension Father   . Heart disease Father   . Stroke Other   . Diabetes Other   . Arthritis Other   . Hypertension Sister   . Hypertension Brother   . Colon cancer Neg Hx     Past Surgical History:  Procedure Laterality Date  . CHOLECYSTECTOMY      ROS: Review of Systems Negative except as above PHYSICAL EXAM: BP (!) 151/84   Pulse 85   Temp 98.2 F (36.8 C) (Oral)   Resp 16   Wt 182 lb 9.6 oz (82.8 kg)   LMP 02/21/2016   SpO2 100%   BMI 34.50 kg/m   Wt Readings from Last 3 Encounters:  11/28/18 182 lb 9.6 oz (82.8 kg)  08/29/18 172 lb 9.6 oz (78.3 kg)  05/26/18 162  lb (73.5 kg)  BP 136/84  Physical  Exam  General appearance - alert, well appearing, and in no distress Mental status - normal mood, behavior, speech, dress, motor activity, and thought processes Neck - supple, no significant adenopathy Chest - clear to auscultation, no wheezes, rales or rhonchi, symmetric air entry Heart - normal rate, regular rhythm, normal S1, S2, no murmurs, rubs, clicks or gallops Extremities - peripheral pulses normal, no pedal edema, no clubbing or cyanosis    Chemistry      Component Value Date/Time   NA 142 08/29/2018 1137   K 4.5 08/29/2018 1137   CL 102 08/29/2018 1137   CO2 24 08/29/2018 1137   BUN 15 08/29/2018 1137   CREATININE 1.08 (H) 08/29/2018 1137   CREATININE 0.94 12/27/2015 1114      Component Value Date/Time   CALCIUM 10.0 08/29/2018 1137   ALKPHOS 70 01/13/2018 1147   AST 13 01/13/2018 1147   ALT 15 01/13/2018 1147   BILITOT 0.4 01/13/2018 1147      ASSESSMENT AND PLAN: 1. Controlled type 2 diabetes mellitus without complication, without long-term current use of insulin (HCC) Under good control.  She will continue metformin  2. Essential hypertension Not at goal. Increase Cozaar to 50 mg daily. - losartan (COZAAR) 50 MG tablet; Take 1 tablet (50 mg total) by mouth daily. Stop Lisinopril  Dispense: 90 tablet; Refill: 1  3. Hyperlipidemia associated with type 2 diabetes mellitus (HCC) Continue simvastatin - POCT glucose (manual entry) - POCT glycosylated hemoglobin (Hb A1C)  4. Insomnia, unspecified type Good sleep hygiene discussed.  Encourage her to turn off all lights and sounds.  Avoid drinking any beverage for hours prior to bedtime that is caffeinated . Advised that she can take the melatonin every night.  5. Obesity (BMI 30.0-34.9) Patient to get back into an exercise routine.  We discussed exercises that she can do at home when it is too cold to walk outside.  Goal is to get in 150 minutes/wk of some form of aerobic exercise    Patient was given the  opportunity to ask questions.  Patient verbalized understanding of the plan and was able to repeat key elements of the plan.   Orders Placed This Encounter  Procedures  . POCT glucose (manual entry)  . POCT glycosylated hemoglobin (Hb A1C)     Requested Prescriptions   Signed Prescriptions Disp Refills  . losartan (COZAAR) 50 MG tablet 90 tablet 1    Sig: Take 1 tablet (50 mg total) by mouth daily. Stop Lisinopril    Return in about 3 months (around 02/27/2019).  Karle Plumber, MD, FACP

## 2018-12-30 DIAGNOSIS — E785 Hyperlipidemia, unspecified: Secondary | ICD-10-CM | POA: Diagnosis not present

## 2018-12-30 DIAGNOSIS — F3340 Major depressive disorder, recurrent, in remission, unspecified: Secondary | ICD-10-CM | POA: Diagnosis not present

## 2018-12-30 DIAGNOSIS — F70 Mild intellectual disabilities: Secondary | ICD-10-CM | POA: Diagnosis not present

## 2018-12-30 DIAGNOSIS — M545 Low back pain: Secondary | ICD-10-CM | POA: Diagnosis not present

## 2018-12-30 DIAGNOSIS — M1990 Unspecified osteoarthritis, unspecified site: Secondary | ICD-10-CM | POA: Diagnosis not present

## 2018-12-30 DIAGNOSIS — I10 Essential (primary) hypertension: Secondary | ICD-10-CM | POA: Diagnosis not present

## 2018-12-30 DIAGNOSIS — E1136 Type 2 diabetes mellitus with diabetic cataract: Secondary | ICD-10-CM | POA: Diagnosis not present

## 2018-12-30 DIAGNOSIS — E669 Obesity, unspecified: Secondary | ICD-10-CM | POA: Diagnosis not present

## 2018-12-30 DIAGNOSIS — Z6834 Body mass index (BMI) 34.0-34.9, adult: Secondary | ICD-10-CM | POA: Diagnosis not present

## 2019-01-19 ENCOUNTER — Other Ambulatory Visit: Payer: Self-pay | Admitting: Internal Medicine

## 2019-01-19 DIAGNOSIS — Z1231 Encounter for screening mammogram for malignant neoplasm of breast: Secondary | ICD-10-CM

## 2019-02-17 DIAGNOSIS — H2513 Age-related nuclear cataract, bilateral: Secondary | ICD-10-CM | POA: Diagnosis not present

## 2019-02-17 DIAGNOSIS — Z7984 Long term (current) use of oral hypoglycemic drugs: Secondary | ICD-10-CM | POA: Diagnosis not present

## 2019-02-17 DIAGNOSIS — H524 Presbyopia: Secondary | ICD-10-CM | POA: Diagnosis not present

## 2019-02-17 DIAGNOSIS — E119 Type 2 diabetes mellitus without complications: Secondary | ICD-10-CM | POA: Diagnosis not present

## 2019-02-17 DIAGNOSIS — H52203 Unspecified astigmatism, bilateral: Secondary | ICD-10-CM | POA: Diagnosis not present

## 2019-02-17 DIAGNOSIS — H5203 Hypermetropia, bilateral: Secondary | ICD-10-CM | POA: Diagnosis not present

## 2019-03-02 ENCOUNTER — Ambulatory Visit
Admission: RE | Admit: 2019-03-02 | Discharge: 2019-03-02 | Disposition: A | Discharge status: Home / Self Care | Payer: Medicare PPO | Source: Ambulatory Visit | Attending: Internal Medicine | Admitting: Internal Medicine

## 2019-03-02 ENCOUNTER — Ambulatory Visit: Payer: Medicare PPO | Admitting: Internal Medicine

## 2019-03-02 DIAGNOSIS — Z1231 Encounter for screening mammogram for malignant neoplasm of breast: Secondary | ICD-10-CM

## 2019-03-03 ENCOUNTER — Encounter: Payer: Self-pay | Admitting: Internal Medicine

## 2019-03-03 ENCOUNTER — Ambulatory Visit: Payer: Medicare PPO | Attending: Internal Medicine | Admitting: Internal Medicine

## 2019-03-03 VITALS — BP 134/82 | HR 72 | Temp 98.1°F | Resp 16 | Ht 61.0 in | Wt 189.4 lb

## 2019-03-03 DIAGNOSIS — Z6835 Body mass index (BMI) 35.0-35.9, adult: Secondary | ICD-10-CM | POA: Diagnosis not present

## 2019-03-03 DIAGNOSIS — I10 Essential (primary) hypertension: Secondary | ICD-10-CM

## 2019-03-03 DIAGNOSIS — E785 Hyperlipidemia, unspecified: Secondary | ICD-10-CM

## 2019-03-03 DIAGNOSIS — E119 Type 2 diabetes mellitus without complications: Secondary | ICD-10-CM | POA: Diagnosis not present

## 2019-03-03 DIAGNOSIS — E1169 Type 2 diabetes mellitus with other specified complication: Secondary | ICD-10-CM | POA: Diagnosis not present

## 2019-03-03 DIAGNOSIS — E669 Obesity, unspecified: Secondary | ICD-10-CM

## 2019-03-03 LAB — POCT GLYCOSYLATED HEMOGLOBIN (HGB A1C): HbA1c, POC (controlled diabetic range): 6.8 % (ref 0.0–7.0)

## 2019-03-03 LAB — GLUCOSE, POCT (MANUAL RESULT ENTRY): POC GLUCOSE: 103 mg/dL — AB (ref 70–99)

## 2019-03-03 NOTE — Patient Instructions (Signed)
Diabetes Mellitus and Exercise Exercising regularly is important for your overall health, especially when you have diabetes (diabetes mellitus). Exercising is not only about losing weight. It has many other health benefits, such as increasing muscle strength and bone density and reducing body fat and stress. This leads to improved fitness, flexibility, and endurance, all of which result in better overall health. Exercise has additional benefits for people with diabetes, including:  Reducing appetite.  Helping to lower and control blood glucose.  Lowering blood pressure.  Helping to control amounts of fatty substances (lipids) in the blood, such as cholesterol and triglycerides.  Helping the body to respond better to insulin (improving insulin sensitivity).  Reducing how much insulin the body needs.  Decreasing the risk for heart disease by: ? Lowering cholesterol and triglyceride levels. ? Increasing the levels of good cholesterol. ? Lowering blood glucose levels. What is my activity plan? Your health care provider or certified diabetes educator can help you make a plan for the type and frequency of exercise (activity plan) that works for you. Make sure that you:  Do at least 150 minutes of moderate-intensity or vigorous-intensity exercise each week. This could be brisk walking, biking, or water aerobics. ? Do stretching and strength exercises, such as yoga or weightlifting, at least 2 times a week. ? Spread out your activity over at least 3 days of the week.  Get some form of physical activity every day. ? Do not go more than 2 days in a row without some kind of physical activity. ? Avoid being inactive for more than 30 minutes at a time. Take frequent breaks to walk or stretch.  Choose a type of exercise or activity that you enjoy, and set realistic goals.  Start slowly, and gradually increase the intensity of your exercise over time. What do I need to know about managing my  diabetes?   Check your blood glucose before and after exercising. ? If your blood glucose is 240 mg/dL (13.3 mmol/L) or higher before you exercise, check your urine for ketones. If you have ketones in your urine, do not exercise until your blood glucose returns to normal. ? If your blood glucose is 100 mg/dL (5.6 mmol/L) or lower, eat a snack containing 15-20 grams of carbohydrate. Check your blood glucose 15 minutes after the snack to make sure that your level is above 100 mg/dL (5.6 mmol/L) before you start your exercise.  Know the symptoms of low blood glucose (hypoglycemia) and how to treat it. Your risk for hypoglycemia increases during and after exercise. Common symptoms of hypoglycemia can include: ? Hunger. ? Anxiety. ? Sweating and feeling clammy. ? Confusion. ? Dizziness or feeling light-headed. ? Increased heart rate or palpitations. ? Blurry vision. ? Tingling or numbness around the mouth, lips, or tongue. ? Tremors or shakes. ? Irritability.  Keep a rapid-acting carbohydrate snack available before, during, and after exercise to help prevent or treat hypoglycemia.  Avoid injecting insulin into areas of the body that are going to be exercised. For example, avoid injecting insulin into: ? The arms, when playing tennis. ? The legs, when jogging.  Keep records of your exercise habits. Doing this can help you and your health care provider adjust your diabetes management plan as needed. Write down: ? Food that you eat before and after you exercise. ? Blood glucose levels before and after you exercise. ? The type and amount of exercise you have done. ? When your insulin is expected to peak, if you use   insulin. Avoid exercising at times when your insulin is peaking.  When you start a new exercise or activity, work with your health care provider to make sure the activity is safe for you, and to adjust your insulin, medicines, or food intake as needed.  Drink plenty of water while  you exercise to prevent dehydration or heat stroke. Drink enough fluid to keep your urine clear or pale yellow. Summary  Exercising regularly is important for your overall health, especially when you have diabetes (diabetes mellitus).  Exercising has many health benefits, such as increasing muscle strength and bone density and reducing body fat and stress.  Your health care provider or certified diabetes educator can help you make a plan for the type and frequency of exercise (activity plan) that works for you.  When you start a new exercise or activity, work with your health care provider to make sure the activity is safe for you, and to adjust your insulin, medicines, or food intake as needed. This information is not intended to replace advice given to you by your health care provider. Make sure you discuss any questions you have with your health care provider. Document Released: 03/01/2004 Document Revised: 06/20/2017 Document Reviewed: 05/21/2016 Elsevier Interactive Patient Education  2019 Elsevier Inc.  

## 2019-03-03 NOTE — Progress Notes (Signed)
Patient ID: [name] Barker, female    DOB: 08-May-1961  MRN: [id number]  CC: Diabetes and Hypertension   Subjective: [name] Barker is a 58 y.o. female who presents for chronic ds management Her concerns today include:  Patient with history ofDM, HL, HTN, perimenopausal, anemia, CKD stage 2-3  DIABETES TYPE 2 Last A1C:   Results for orders placed or performed in visit on 03/03/19  POCT glucose (manual entry)  Result Value Ref Range   POC Glucose 103 (A) 70 - 99 mg/dl  POCT glycosylated hemoglobin (Hb A1C)  Result Value Ref Range   Hemoglobin A1C     HbA1c POC (<> result, manual entry)     HbA1c, POC (prediabetic range)     HbA1c, POC (controlled diabetic range) 6.8 0.0 - 7.0 %    Med Adherence:  '[x]'  Yes    '[]'  No Medication side effects:  '[]'  Yes    '[x]'  No Home Monitoring?  '[x]'  Yes, once a day sometimes before BF and after dinner.  After dinner <180    Home glucose results range: Diet Adherence: "I could do better."  Likes sodas Exercise: '[]'  Yes    '[x]'  Not much.  Gained 17 lbs since Sept 2019  Hypoglycemic episodes?: '[]'  Yes    '[x]'  No Numbness of the feet? '[]'  Yes    '[x]'  No but sometimes in finger tips Retinopathy hx? '[]'  Yes    '[x]'  No Last eye exam: less than 1 mth ago Comments:   Insomnia:  Little better  HYPERTENSION Currently taking: see medication list.  Cozaar was increased on last visit. Med Adherence: '[x]'  Yes    '[]'  No Medication side effects: '[]'  Yes    '[x]'  No Adherence with salt restriction: '[x]'  Yes    '[]'  No Home Monitoring?: '[x]'  Yes    '[]'  No Monitoring Frequency: '[]'  Yes    '[]'  No Home BP results range: '[]'  Yes    '[]'  No SOB? '[]'  Yes    '[x]'  No Chest Pain?: '[]'  Yes    '[x]'  No Leg swelling?: '[]'  Yes    '[x]'  No Headaches?: '[]'  Yes    '[x]'  No Dizziness? '[]'  Yes    '[x]'  No Comments:   HL: Compliant with simvastatin. Patient Active Problem List   Diagnosis Date Noted  . 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 Perimenopausal 09/13/2016  . Hyperlipidemia  associated with type 2 diabetes mellitus ([place]) 07/22/2014  . Esophageal reflux 07/22/2014  . Diabetes ([place]) 04/22/2014  . Essential hypertension, benign 04/22/2014     Current Outpatient Medications on File Prior to Visit  Medication Sig Dispense Refill  . aspirin EC 81 MG tablet Take 1 tablet (81 mg total) by mouth daily. 90 tablet 3  . Blood Glucose Calibration (ACCU-CHEK SMARTVIEW CONTROL) LIQD Use as directed 1 each 2  . Blood Glucose Monitoring Suppl (ACCU-CHEK NANO SMARTVIEW) w/Device KIT Use as directed for once daily testing of blood sugar. E11.9 1 kit 0  . ketoconazole (NIZORAL) 2 % cream Apply to affected area daily 30 g 0  . losartan (COZAAR) 50 MG tablet Take 1 tablet (50 mg total) by mouth daily. Stop Lisinopril 90 tablet 1  . metFORMIN (GLUCOPHAGE) 1000 MG tablet Take 1 tablet (1,000 mg total) by mouth 2 (two) times daily with a meal. 180 tablet 3  . Multiple Vitamin (MULTIVITAMIN WITH MINERALS) TABS tablet Take 1 tablet by mouth daily. 100 tablet 0  . Omega-3 Fatty Acids (FISH OIL) 1000 MG CAPS Take 1 capsule (  1,000 mg total) by mouth daily. 100 capsule 1  . simvastatin (ZOCOR) 20 MG tablet Take 1 tablet (20 mg total) by mouth at bedtime. 90 tablet 3  . tetrahydrozoline 0.05 % ophthalmic solution Place 2 drops into both eyes daily as needed (for dry eyes).    Marland Kitchen tiZANidine (ZANAFLEX) 2 MG tablet Take 1 tablet by mouth every 6 (six) hours as needed.    . venlafaxine XR (EFFEXOR-XR) 75 MG 24 hr capsule TAKE 1 CAPSULE DAILY WITH BREAKFAST 90 capsule 3   No current facility-administered medications on file prior to visit.     No Known Allergies  Social History   Socioeconomic History  . Marital status: Single    Spouse name: Not on file  . Number of children: Not on file  . Years of education: Not on file  . Highest education level: Not on file  Occupational History  . Not on file  Social Needs  . Financial resource strain: Not on file  . Food insecurity:    Worry:  Not on file    Inability: Not on file  . Transportation needs:    Medical: Not on file    Non-medical: Not on file  Tobacco Use  . Smoking status: Never Smoker  . Smokeless tobacco: Never Used  Substance and Sexual Activity  . Alcohol use: No  . Drug use: No  . Sexual activity: Not on file  Lifestyle  . Physical activity:    Days per week: Not on file    Minutes per session: Not on file  . Stress: Not on file  Relationships  . Social connections:    Talks on phone: Not on file    Gets together: Not on file    Attends religious service: Not on file    Active member of club or organization: Not on file    Attends meetings of clubs or organizations: Not on file    Relationship status: Not on file  . Intimate partner violence:    Fear of current or ex partner: Not on file    Emotionally abused: Not on file    Physically abused: Not on file    Forced sexual activity: Not on file  Other Topics Concern  . 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     Family History  Problem Relation Age of Onset  . Hypertension Mother   . Hypertension Father   . Heart disease Father   . Stroke Other   . Diabetes Other   . Arthritis Other   . Hypertension Sister   . Hypertension Brother   . Colon cancer Neg Hx     Past Surgical History:  Procedure Laterality Date  . CHOLECYSTECTOMY      ROS: Review of Systems Negative except as stated above  PHYSICAL EXAM: BP 134/82   Pulse 72   Temp 98.1 F (36.7 C) (Oral)   Resp 16   Ht '5\' 1"'  (1.549 m)   Wt 189 lb 6.4 oz (85.9 kg)   LMP 02/21/2016   SpO2 100%   BMI 35.79 kg/m   Wt Readings from Last 3 Encounters:  03/03/19 189 lb 6.4 oz (85.9 kg)  11/28/18 182 lb 9.6 oz (82.8 kg)  08/29/18 172 lb 9.6 oz (78.3 kg)   BP 134/82  Physical Exam  General appearance - alert, well appearing, and in no distress Mental status - normal mood, behavior, speech,  dress, motor activity,  and thought processes Ears -moderate wax buildup in the right ear. Mouth - mucous membranes moist, pharynx normal without lesions Neck - supple, no significant adenopathy Chest - clear to auscultation, no wheezes, rales or rhonchi, symmetric air entry Heart - normal rate, regular rhythm, normal S1, S2, no murmurs, rubs, clicks or gallops Extremities - peripheral pulses normal, no pedal edema, no clubbing or cyanosis Diabetic Foot Exam - Simple   Simple Foot Form Visual Inspection No deformities, no ulcerations, no other skin breakdown bilaterally:  Yes Sensation Testing Intact to touch and monofilament testing bilaterally:  Yes Pulse Check Posterior Tibialis and Dorsalis pulse intact bilaterally:  Yes Comments      CMP Latest Ref Rng & Units 08/29/2018 05/26/2018 01/13/2018  Glucose 65 - 99 mg/dL 91 109(H) 93  BUN 6 - 24 mg/dL '15 23 12  ' Creatinine 0.57 - 1.00 mg/dL 1.08(H) 1.62(H) 1.20(H)  Sodium 134 - 144 mmol/L 142 136 144  Potassium 3.5 - 5.2 mmol/L 4.5 4.3 4.4  Chloride 96 - 106 mmol/L 102 102 106  CO2 20 - 29 mmol/L 24 19(L) 24  Calcium 8.7 - 10.2 mg/dL 10.0 9.3 9.6  Total Protein 6.0 - 8.5 g/dL - - 7.0  Total Bilirubin 0.0 - 1.2 mg/dL - - 0.4  Alkaline Phos 39 - 117 IU/L - - 70  AST 0 - 40 IU/L - - 13  ALT 0 - 32 IU/L - - 15   Lipid Panel     Component Value Date/Time   CHOL 170 01/13/2018 1147   TRIG 118 01/13/2018 1147   HDL 63 01/13/2018 1147   CHOLHDL 2.7 01/13/2018 1147   CHOLHDL 2.9 09/13/2016 0949   VLDL 23 09/13/2016 0949   LDLCALC 83 01/13/2018 1147    CBC    Component Value Date/Time   WBC 7.6 05/26/2018 1151   WBC 6.2 01/06/2014 1415   RBC 3.92 05/26/2018 1151   RBC 4.39 01/06/2014 1415   HGB 11.1 05/26/2018 1151   HCT 33.4 (L) 05/26/2018 1151   PLT 312 05/26/2018 1151   MCV 85 05/26/2018 1151   MCH 28.3 05/26/2018 1151   MCH 29.8 01/06/2014 1415   MCHC 33.2 05/26/2018 1151   MCHC 35.5 01/06/2014 1415   RDW 13.5 05/26/2018 1151     ASSESSMENT AND PLAN: 1. Controlled type 2 diabetes mellitus without complication, without long-term current use of insulin ([place]) Dietary counseling given. Encouraged her to get in some form of aerobic exercise at least 3 to 4 days a week for 15 to 30 minutes. Continue metformin - POCT glucose (manual entry) - POCT glycosylated hemoglobin (Hb A1C)  2. Hyperlipidemia associated with type 2 diabetes mellitus (HCC) Continue simvastatin  3. Essential hypertension Repeat blood pressure closer to goal.  Continue Cozaar.  Advised patient to check blood pressure whenever she is at [facility] and write down the numbers for me  4. Obesity (BMI 35.0-39.9 without comorbidity) See #1 above     Patient was given the opportunity to ask questions.  Patient verbalized understanding of the plan and was able to repeat key elements of the plan.   Orders Placed This Encounter  Procedures  . POCT glucose (manual entry)  . POCT glycosylated hemoglobin (Hb A1C)     Requested Prescriptions    No prescriptions requested or ordered in this encounter    Return in about 3 months (around 06/03/2019).  Karle Plumber, MD, FACP

## 2019-03-27 ENCOUNTER — Other Ambulatory Visit: Payer: Self-pay | Admitting: Internal Medicine

## 2019-03-27 DIAGNOSIS — N951 Menopausal and female climacteric states: Secondary | ICD-10-CM

## 2019-04-13 ENCOUNTER — Other Ambulatory Visit: Payer: Self-pay | Admitting: Internal Medicine

## 2019-04-13 DIAGNOSIS — I10 Essential (primary) hypertension: Secondary | ICD-10-CM

## 2019-04-23 ENCOUNTER — Other Ambulatory Visit: Payer: Self-pay | Admitting: Internal Medicine

## 2019-04-23 DIAGNOSIS — E785 Hyperlipidemia, unspecified: Secondary | ICD-10-CM

## 2019-05-20 ENCOUNTER — Other Ambulatory Visit: Payer: Self-pay | Admitting: Internal Medicine

## 2019-05-20 DIAGNOSIS — N951 Menopausal and female climacteric states: Secondary | ICD-10-CM

## 2019-06-02 ENCOUNTER — Other Ambulatory Visit: Payer: Self-pay

## 2019-06-02 ENCOUNTER — Encounter: Payer: Self-pay | Admitting: Internal Medicine

## 2019-06-02 ENCOUNTER — Ambulatory Visit: Payer: Medicare PPO | Attending: Family Medicine | Admitting: Internal Medicine

## 2019-06-02 ENCOUNTER — Telehealth: Payer: Self-pay | Admitting: Internal Medicine

## 2019-06-02 DIAGNOSIS — N951 Menopausal and female climacteric states: Secondary | ICD-10-CM | POA: Diagnosis not present

## 2019-06-02 DIAGNOSIS — I10 Essential (primary) hypertension: Secondary | ICD-10-CM | POA: Diagnosis not present

## 2019-06-02 DIAGNOSIS — E785 Hyperlipidemia, unspecified: Secondary | ICD-10-CM | POA: Diagnosis not present

## 2019-06-02 DIAGNOSIS — E119 Type 2 diabetes mellitus without complications: Secondary | ICD-10-CM

## 2019-06-02 MED ORDER — SIMVASTATIN 20 MG PO TABS: 20.0000 mg | ORAL_TABLET | Freq: Every day | ORAL | 3 refills | Status: DC

## 2019-06-02 MED ORDER — ASPIRIN EC 81 MG PO TBEC: 81.0000 mg | DELAYED_RELEASE_TABLET | Freq: Every day | ORAL | 6 refills | Status: DC

## 2019-06-02 MED ORDER — VENLAFAXINE HCL ER 75 MG PO CP24: 75.0000 mg | ORAL_CAPSULE | Freq: Every day | ORAL | 3 refills | Status: DC

## 2019-06-02 MED ORDER — METFORMIN HCL 1000 MG PO TABS: 1000.0000 mg | ORAL_TABLET | Freq: Two times a day (BID) | ORAL | 3 refills | Status: DC

## 2019-06-02 MED ORDER — LOSARTAN POTASSIUM 50 MG PO TABS: ORAL_TABLET | ORAL | 3 refills | Status: DC

## 2019-06-02 NOTE — Progress Notes (Signed)
Pt states her blood sugar this morning was 222

## 2019-06-02 NOTE — Telephone Encounter (Signed)
Called to schedule an appt with for dr.johnson 3 mo fu LVM to call back

## 2019-06-02 NOTE — Progress Notes (Signed)
Virtual Visit via Telephone Note Due to current restrictions/limitations of in-office visits due to the COVID-19 pandemic, this scheduled clinical appointment was converted to a telehealth visit  I connected with [name] Barker on 06/02/19 at 2:55 p.m EDT by telephone and verified that I am speaking with the correct person using two identifiers. I am in my office.  The patient is at home.  Only the patient and myself participated in this encounter.  I discussed the limitations, risks, security and privacy concerns of performing an evaluation and management service by telephone and the availability of in person appointments. I also discussed with the patient that there may be a patient responsible charge related to this service. The patient expressed understanding and agreed to proceed.   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  Purpose of today's visit is chronic disease management.  Patient was last evaluated 02/2019  DIABETES TYPE 2 Last A1C:   Results for orders placed or performed in visit on 03/03/19  POCT glucose (manual entry)  Result Value Ref Range   POC Glucose 103 (A) 70 - 99 mg/dl  POCT glycosylated hemoglobin (Hb A1C)  Result Value Ref Range   Hemoglobin A1C     HbA1c POC (<> result, manual entry)     HbA1c, POC (prediabetic range)     HbA1c, POC (controlled diabetic range) 6.8 0.0 - 7.0 %    Med Adherence:  _0  Yes on Metformin   _1  No Medication side effects:  _2  Yes    _3  No Home Monitoring?  _4  Yes, once every 3 days.  Range 180-200 30 mins after meals   Home glucose results range: Diet Adherence: "I can do better with that."  Cooks most of the time Exercise: _5  Yes - she walks in her neighborhood 2-3 x a wk and tries to walk in place at home    Hypoglycemic episodes?: _6  Yes    _7  No Numbness of the feet? _8  Yes    _9  No but does notice it in hands Retinopathy hx? _10  Yes    _11  No Last  eye exam:  Comments:   HYPERTENSION Currently taking: see medication list - Cozaar Med Adherence: _12  Yes    _13  No Medication side effects: _14  Yes    _15  No Adherence with salt restriction: _16  Yes    _17  No Home Monitoring?: _18  Yes    _19  No, no device to check SOB? _20  Yes    _21  No Chest Pain?: _22  Yes    _23  No Leg swelling?: _24  Yes    _25  No Headaches?: _26  Yes    _27  No Dizziness? _28  Yes    _29  No Comments:   HL:  Compliant with Zocor.  Needs RF  Hot flashes:  Needs RF on Effexor  Outpatient Encounter Medications as of 06/02/2019  Medication Sig Note  . aspirin EC 81 MG tablet Take 1 tablet (81 mg total) by mouth daily.   . Blood Glucose Calibration (ACCU-CHEK SMARTVIEW CONTROL) LIQD Use as directed   . Blood Glucose Monitoring Suppl (ACCU-CHEK NANO SMARTVIEW) w/Device KIT Use as directed for once daily testing of blood sugar. E11.9   . ketoconazole (NIZORAL) 2 % cream Apply to affected area daily   . losartan (COZAAR) 50 MG tablet TAKE 1 TABLET ONE TIME DAILY  (STOP LISINOPRIL)   . metFORMIN (GLUCOPHAGE) 1000 MG tablet Take 1 tablet (1,000 mg total) by mouth 2 (two) times daily with a  meal.   . Multiple Vitamin (MULTIVITAMIN WITH MINERALS) TABS tablet Take 1 tablet by mouth daily.   . Omega-3 Fatty Acids (FISH OIL) 1000 MG CAPS Take 1 capsule (1,000 mg total) by mouth daily.   . simvastatin (ZOCOR) 20 MG tablet TAKE 1 TABLET (20 MG TOTAL) BY MOUTH AT BEDTIME.   Marland Kitchen tetrahydrozoline 0.05 % ophthalmic solution Place 2 drops into both eyes daily as needed (for dry eyes).   Marland Kitchen tiZANidine (ZANAFLEX) 2 MG tablet Take 1 tablet by mouth every 6 (six) hours as needed. 10/23/2017: Patient said she is not taking this  . venlafaxine XR (EFFEXOR-XR) 75 MG 24 hr capsule TAKE 1 CAPSULE DAILY WITH BREAKFAST    No facility-administered encounter medications on file as of 06/02/2019.     Observations/Objective:    Chemistry      Component Value Date/Time   NA 142 08/29/2018 1137   K 4.5  08/29/2018 1137   CL 102 08/29/2018 1137   CO2 24 08/29/2018 1137   BUN 15 08/29/2018 1137   CREATININE 1.08 (H) 08/29/2018 1137   CREATININE 0.94 12/27/2015 1114      Component Value Date/Time   CALCIUM 10.0 08/29/2018 1137   ALKPHOS 70 01/13/2018 1147   AST 13 01/13/2018 1147   ALT 15 01/13/2018 1147   BILITOT 0.4 01/13/2018 1147     Lab Results  Component Value Date   WBC 7.6 05/26/2018   HGB 11.1 05/26/2018   HCT 33.4 (L) 05/26/2018   MCV 85 05/26/2018   PLT 312 05/26/2018    Assessment and Plan: 1. Type 2 diabetes mellitus without complication, without long-term current use of insulin (HCC) -Dietary counseling given.  Commended her on moving more.  Encouraged her to set an exercise goal of at least 3 to 4 days a week for 30 minutes.  Continue metformin. Advised to check blood sugars either before meals or 2 hours after meal. - aspirin EC 81 MG tablet; Take 1 tablet (81 mg total) by mouth daily.  Dispense: 90 tablet; Refill: 6 - metFORMIN (GLUCOPHAGE) 1000 MG tablet; Take 1 tablet (1,000 mg total) by mouth 2 (two) times daily with a meal.  Dispense: 180 tablet; Refill: 3 - CBC; Future - Lipid panel; Future - Comprehensive metabolic panel; Future - Hemoglobin A1c; Future  2. Essential hypertension Level of control unknown as she does not have a home blood pressure monitoring device.  Continue Cozaar and low-salt diet. - losartan (COZAAR) 50 MG tablet; TAKE 1 TABLET ONE TIME DAILY  (STOP LISINOPRIL)  Dispense: 90 tablet; Refill: 3  3. Hyperlipidemia, unspecified hyperlipidemia type - simvastatin (ZOCOR) 20 MG tablet; Take 1 tablet (20 mg total) by mouth at bedtime.  Dispense: 90 tablet; Refill: 3  4. Perimenopausal - venlafaxine XR (EFFEXOR-XR) 75 MG 24 hr capsule; Take 1 capsule (75 mg total) by mouth daily with breakfast.  Dispense: 90 capsule; Refill: 3   Follow Up Instructions: F/u in 3 mths  Will come to the lab later this week to have blood test done. I  discussed the assessment and treatment plan with the patient. The patient was provided an opportunity to ask questions and all were answered. The patient agreed with the plan and demonstrated an understanding of the instructions.   The patient was advised to call back or seek an in-person evaluation if the symptoms worsen or if the condition fails to improve as anticipated.  I provided  15 minutes of non-face-to-face time during this encounter.   Karle Plumber,  MD   

## 2019-06-03 ENCOUNTER — Ambulatory Visit: Payer: Medicare PPO | Admitting: Family Medicine

## 2019-06-04 ENCOUNTER — Other Ambulatory Visit: Payer: Medicare PPO

## 2019-06-05 ENCOUNTER — Other Ambulatory Visit: Payer: Self-pay

## 2019-06-05 ENCOUNTER — Ambulatory Visit: Payer: Medicare PPO | Attending: Internal Medicine

## 2019-06-05 DIAGNOSIS — E119 Type 2 diabetes mellitus without complications: Secondary | ICD-10-CM

## 2019-06-06 LAB — HEMOGLOBIN A1C
Est. average glucose Bld gHb Est-mCnc: 183 mg/dL
Hgb A1c MFr Bld: 8 % — ABNORMAL HIGH (ref 4.8–5.6)

## 2019-06-06 LAB — CBC
Hematocrit: 32.2 % — ABNORMAL LOW (ref 34.0–46.6)
Hemoglobin: 10.9 g/dL — ABNORMAL LOW (ref 11.1–15.9)
MCH: 28.8 pg (ref 26.6–33.0)
MCHC: 33.9 g/dL (ref 31.5–35.7)
MCV: 85 fL (ref 79–97)
Platelets: 285 10*3/uL (ref 150–450)
RBC: 3.79 x10E6/uL (ref 3.77–5.28)
RDW: 13.3 % (ref 11.7–15.4)
WBC: 7 10*3/uL (ref 3.4–10.8)

## 2019-06-06 LAB — COMPREHENSIVE METABOLIC PANEL
ALT: 23 IU/L (ref 0–32)
AST: 19 IU/L (ref 0–40)
Albumin/Globulin Ratio: 2 (ref 1.2–2.2)
Albumin: 4.6 g/dL (ref 3.8–4.9)
Alkaline Phosphatase: 85 IU/L (ref 39–117)
BUN/Creatinine Ratio: 15 (ref 9–23)
BUN: 18 mg/dL (ref 6–24)
Bilirubin Total: 0.6 mg/dL (ref 0.0–1.2)
CO2: 24 mmol/L (ref 20–29)
Calcium: 9.6 mg/dL (ref 8.7–10.2)
Chloride: 102 mmol/L (ref 96–106)
Creatinine, Ser: 1.2 mg/dL — ABNORMAL HIGH (ref 0.57–1.00)
GFR calc Af Amer: 58 mL/min/{1.73_m2} — ABNORMAL LOW (ref >59)
GFR calc non Af Amer: 50 mL/min/{1.73_m2} — ABNORMAL LOW (ref >59)
Globulin, Total: 2.3 g/dL (ref 1.5–4.5)
Glucose: 136 mg/dL — ABNORMAL HIGH (ref 65–99)
Potassium: 4.7 mmol/L (ref 3.5–5.2)
Sodium: 142 mmol/L (ref 134–144)
Total Protein: 6.9 g/dL (ref 6.0–8.5)

## 2019-06-06 LAB — LIPID PANEL
Chol/HDL Ratio: 4.1 ratio (ref 0.0–4.4)
Cholesterol, Total: 196 mg/dL (ref 100–199)
HDL: 48 mg/dL (ref >39)
LDL Calculated: 113 mg/dL — ABNORMAL HIGH (ref 0–99)
Triglycerides: 174 mg/dL — ABNORMAL HIGH (ref 0–149)
VLDL Cholesterol Cal: 35 mg/dL (ref 5–40)

## 2019-06-10 ENCOUNTER — Telehealth: Payer: Self-pay | Admitting: Internal Medicine

## 2019-06-10 MED ORDER — GLIMEPIRIDE 2 MG PO TABS: 2.0000 mg | ORAL_TABLET | Freq: Every day | ORAL | 1 refills | Status: DC

## 2019-06-10 NOTE — Telephone Encounter (Signed)
PC placed to pt today to go over lab results.  Pt advised that A1C is 8 up 2 points from 3 mths ago.  She admits that she can do better with eating habits.  Reports compliance with Metformin.  I recommend adding Amaryl and encourage healthy eating habits. Anemia is stable, Kidney function worse compared to 08/2018.  She is not on any NSAIDS.  Advise to avoid OTC pain meds and to drink several glasses water daily.   LDL not at goal.  Pt states she had been out of Zocor for 1-2 wks and just received it today.  Results for orders placed or performed in visit on 06/05/19  Hemoglobin A1c  Result Value Ref Range   Hgb A1c MFr Bld 8.0 (H) 4.8 - 5.6 %   Est. average glucose Bld gHb Est-mCnc 183 mg/dL  Comprehensive metabolic panel  Result Value Ref Range   Glucose 136 (H) 65 - 99 mg/dL   BUN 18 6 - 24 mg/dL   Creatinine, Ser 1.20 (H) 0.57 - 1.00 mg/dL   GFR calc non Af Amer 50 (L) >59 mL/min/1.73   GFR calc Af Amer 58 (L) >59 mL/min/1.73   BUN/Creatinine Ratio 15 9 - 23   Sodium 142 134 - 144 mmol/L   Potassium 4.7 3.5 - 5.2 mmol/L   Chloride 102 96 - 106 mmol/L   CO2 24 20 - 29 mmol/L   Calcium 9.6 8.7 - 10.2 mg/dL   Total Protein 6.9 6.0 - 8.5 g/dL   Albumin 4.6 3.8 - 4.9 g/dL   Globulin, Total 2.3 1.5 - 4.5 g/dL   Albumin/Globulin Ratio 2.0 1.2 - 2.2   Bilirubin Total 0.6 0.0 - 1.2 mg/dL   Alkaline Phosphatase 85 39 - 117 IU/L   AST 19 0 - 40 IU/L   ALT 23 0 - 32 IU/L  Lipid panel  Result Value Ref Range   Cholesterol, Total 196 100 - 199 mg/dL   Triglycerides 174 (H) 0 - 149 mg/dL   HDL 48 >39 mg/dL   VLDL Cholesterol Cal 35 5 - 40 mg/dL   LDL Calculated 113 (H) 0 - 99 mg/dL   Chol/HDL Ratio 4.1 0.0 - 4.4 ratio  CBC  Result Value Ref Range   WBC 7.0 3.4 - 10.8 x10E3/uL   RBC 3.79 3.77 - 5.28 x10E6/uL   Hemoglobin 10.9 (L) 11.1 - 15.9 g/dL   Hematocrit 32.2 (L) 34.0 - 46.6 %   MCV 85 79 - 97 fL   MCH 28.8 26.6 - 33.0 pg   MCHC 33.9 31.5 - 35.7 g/dL   RDW 13.3 11.7 - 15.4 %    Platelets 285 150 - 450 x10E3/uL

## 2019-09-01 ENCOUNTER — Ambulatory Visit: Payer: Medicare PPO | Admitting: Internal Medicine

## 2019-09-27 ENCOUNTER — Inpatient Hospital Stay (HOSPITAL_COMMUNITY)
Admission: EM | Admit: 2019-09-27 | Discharge: 2019-10-02 | DRG: 177 | Disposition: A | Discharge status: Home / Self Care | Payer: Medicare PPO | Attending: Internal Medicine | Admitting: Internal Medicine

## 2019-09-27 ENCOUNTER — Emergency Department (HOSPITAL_COMMUNITY): Payer: Medicare PPO

## 2019-09-27 ENCOUNTER — Other Ambulatory Visit: Payer: Self-pay

## 2019-09-27 ENCOUNTER — Encounter (HOSPITAL_COMMUNITY): Payer: Self-pay

## 2019-09-27 DIAGNOSIS — Z6834 Body mass index (BMI) 34.0-34.9, adult: Secondary | ICD-10-CM

## 2019-09-27 DIAGNOSIS — U071 COVID-19: Secondary | ICD-10-CM | POA: Diagnosis present

## 2019-09-27 DIAGNOSIS — R945 Abnormal results of liver function studies: Secondary | ICD-10-CM

## 2019-09-27 DIAGNOSIS — E860 Dehydration: Secondary | ICD-10-CM | POA: Diagnosis present

## 2019-09-27 DIAGNOSIS — E785 Hyperlipidemia, unspecified: Secondary | ICD-10-CM | POA: Diagnosis present

## 2019-09-27 DIAGNOSIS — R7989 Other specified abnormal findings of blood chemistry: Secondary | ICD-10-CM | POA: Diagnosis present

## 2019-09-27 DIAGNOSIS — N183 Chronic kidney disease, stage 3 unspecified: Secondary | ICD-10-CM | POA: Diagnosis present

## 2019-09-27 DIAGNOSIS — J9601 Acute respiratory failure with hypoxia: Secondary | ICD-10-CM | POA: Diagnosis present

## 2019-09-27 DIAGNOSIS — E1122 Type 2 diabetes mellitus with diabetic chronic kidney disease: Secondary | ICD-10-CM | POA: Diagnosis present

## 2019-09-27 DIAGNOSIS — N179 Acute kidney failure, unspecified: Secondary | ICD-10-CM | POA: Diagnosis present

## 2019-09-27 DIAGNOSIS — R9431 Abnormal electrocardiogram [ECG] [EKG]: Secondary | ICD-10-CM | POA: Diagnosis not present

## 2019-09-27 DIAGNOSIS — E7800 Pure hypercholesterolemia, unspecified: Secondary | ICD-10-CM | POA: Diagnosis present

## 2019-09-27 DIAGNOSIS — N1831 Chronic kidney disease, stage 3a: Secondary | ICD-10-CM

## 2019-09-27 DIAGNOSIS — Z7982 Long term (current) use of aspirin: Secondary | ICD-10-CM

## 2019-09-27 DIAGNOSIS — J1282 Pneumonia due to coronavirus disease 2019: Secondary | ICD-10-CM

## 2019-09-27 DIAGNOSIS — N281 Cyst of kidney, acquired: Secondary | ICD-10-CM | POA: Diagnosis present

## 2019-09-27 DIAGNOSIS — Z8249 Family history of ischemic heart disease and other diseases of the circulatory system: Secondary | ICD-10-CM | POA: Diagnosis not present

## 2019-09-27 DIAGNOSIS — Z9049 Acquired absence of other specified parts of digestive tract: Secondary | ICD-10-CM | POA: Diagnosis not present

## 2019-09-27 DIAGNOSIS — J1289 Other viral pneumonia: Secondary | ICD-10-CM | POA: Diagnosis present

## 2019-09-27 DIAGNOSIS — Z833 Family history of diabetes mellitus: Secondary | ICD-10-CM | POA: Diagnosis not present

## 2019-09-27 DIAGNOSIS — R109 Unspecified abdominal pain: Secondary | ICD-10-CM | POA: Diagnosis not present

## 2019-09-27 DIAGNOSIS — F329 Major depressive disorder, single episode, unspecified: Secondary | ICD-10-CM | POA: Diagnosis present

## 2019-09-27 DIAGNOSIS — I10 Essential (primary) hypertension: Secondary | ICD-10-CM | POA: Diagnosis present

## 2019-09-27 DIAGNOSIS — R918 Other nonspecific abnormal finding of lung field: Secondary | ICD-10-CM | POA: Diagnosis not present

## 2019-09-27 DIAGNOSIS — Z7984 Long term (current) use of oral hypoglycemic drugs: Secondary | ICD-10-CM | POA: Diagnosis not present

## 2019-09-27 DIAGNOSIS — E1129 Type 2 diabetes mellitus with other diabetic kidney complication: Secondary | ICD-10-CM | POA: Diagnosis present

## 2019-09-27 LAB — COMPREHENSIVE METABOLIC PANEL
ALT: 63 U/L — ABNORMAL HIGH (ref 0–44)
AST: 56 U/L — ABNORMAL HIGH (ref 15–41)
Albumin: 3.9 g/dL (ref 3.5–5.0)
Alkaline Phosphatase: 66 U/L (ref 38–126)
Anion gap: 12 (ref 5–15)
BUN: 32 mg/dL — ABNORMAL HIGH (ref 6–20)
CO2: 22 mmol/L (ref 22–32)
Calcium: 8.8 mg/dL — ABNORMAL LOW (ref 8.9–10.3)
Chloride: 103 mmol/L (ref 98–111)
Creatinine, Ser: 2.01 mg/dL — ABNORMAL HIGH (ref 0.44–1.00)
GFR calc Af Amer: 31 mL/min — ABNORMAL LOW (ref >60)
GFR calc non Af Amer: 27 mL/min — ABNORMAL LOW (ref >60)
Glucose, Bld: 132 mg/dL — ABNORMAL HIGH (ref 70–99)
Potassium: 4.5 mmol/L (ref 3.5–5.1)
Sodium: 137 mmol/L (ref 135–145)
Total Bilirubin: 0.8 mg/dL (ref 0.3–1.2)
Total Protein: 8.3 g/dL — ABNORMAL HIGH (ref 6.5–8.1)

## 2019-09-27 LAB — CBC WITH DIFFERENTIAL/PLATELET
Abs Immature Granulocytes: 0.25 10*3/uL — ABNORMAL HIGH (ref 0.00–0.07)
Basophils Absolute: 0 10*3/uL (ref 0.0–0.1)
Basophils Relative: 1 %
Eosinophils Absolute: 0 10*3/uL (ref 0.0–0.5)
Eosinophils Relative: 0 %
HCT: 32.6 % — ABNORMAL LOW (ref 36.0–46.0)
Hemoglobin: 10.8 g/dL — ABNORMAL LOW (ref 12.0–15.0)
Immature Granulocytes: 4 %
Lymphocytes Relative: 24 %
Lymphs Abs: 1.6 10*3/uL (ref 0.7–4.0)
MCH: 28.3 pg (ref 26.0–34.0)
MCHC: 33.1 g/dL (ref 30.0–36.0)
MCV: 85.6 fL (ref 80.0–100.0)
Monocytes Absolute: 0.6 10*3/uL (ref 0.1–1.0)
Monocytes Relative: 9 %
Neutro Abs: 4.3 10*3/uL (ref 1.7–7.7)
Neutrophils Relative %: 62 %
Platelets: 400 10*3/uL (ref 150–400)
RBC: 3.81 MIL/uL — ABNORMAL LOW (ref 3.87–5.11)
RDW: 13.2 % (ref 11.5–15.5)
WBC: 6.9 10*3/uL (ref 4.0–10.5)
nRBC: 0.4 % — ABNORMAL HIGH (ref 0.0–0.2)

## 2019-09-27 LAB — SARS CORONAVIRUS 2 BY RT PCR (HOSPITAL ORDER, PERFORMED IN ~~LOC~~ HOSPITAL LAB): SARS Coronavirus 2: POSITIVE — AB

## 2019-09-27 LAB — URINALYSIS, ROUTINE W REFLEX MICROSCOPIC
Bilirubin Urine: NEGATIVE
Glucose, UA: 50 mg/dL — AB
Ketones, ur: NEGATIVE mg/dL
Leukocytes,Ua: NEGATIVE
Nitrite: NEGATIVE
Protein, ur: 100 mg/dL — AB
Specific Gravity, Urine: 1.014 (ref 1.005–1.030)
pH: 5 (ref 5.0–8.0)

## 2019-09-27 LAB — LACTIC ACID, PLASMA: Lactic Acid, Venous: 1.7 mmol/L (ref 0.5–1.9)

## 2019-09-27 LAB — BRAIN NATRIURETIC PEPTIDE: B Natriuretic Peptide: 33.6 pg/mL (ref 0.0–100.0)

## 2019-09-27 LAB — LIPASE, BLOOD: Lipase: 47 U/L (ref 11–51)

## 2019-09-27 MED ORDER — AMOXICILLIN 500 MG PO CAPS
1000.0000 mg | ORAL_CAPSULE | Freq: Once | ORAL | Status: DC
  Filled 2019-09-27: qty 2

## 2019-09-27 MED ORDER — SODIUM CHLORIDE 0.9 % IV SOLN
INTRAVENOUS | Status: DC
  Administered 2019-09-27 – 2019-09-28 (×2): via INTRAVENOUS

## 2019-09-27 MED ORDER — AZITHROMYCIN 250 MG PO TABS
500.0000 mg | ORAL_TABLET | Freq: Once | ORAL | Status: DC
  Filled 2019-09-27: qty 2

## 2019-09-27 NOTE — ED Provider Notes (Signed)
[facility] DEPT Provider Note   CSN: [id number] Arrival date & time: 09/27/19  1648     History   Chief Complaint Chief Complaint  Patient presents with  . Flank Pain    HPI [name] Barker is a 58 y.o. female.     Patient with history of diabetes, high cholesterol, previous cholecystectomy --presents to the emergency department with complaint of 3 days of bilateral flank pain.  Patient has had a slight cough.  She denies any fevers or chills, sore throat, other URI symptoms.  No chest pain or shortness of breath.  She states she can walk across the room without getting short winded.  She had some diarrhea, watery, nonbloody 2 days ago that resolved.  No urinary symptoms.  She does not have worsening pain when she takes in a deep breath.  No lower extremity swelling.  No history of blood clots.  The onset of this condition was acute. The course is constant. Aggravating factors: none. Alleviating factors: none.       Past Medical History:  Diagnosis Date  . Cataract 01/2018   BL - Dr. Gershon Crane  . Diabetes mellitus Dx 1998  . High cholesterol   . Hip pain   . Other and unspecified hyperlipidemia   . Personal history of unspecified circulatory disease   . S/P cardiac cath June 2008   abnormal cardiolite. LVH..question on prior studies and question septal hypertroph...not seen echo... EF 65%    Patient Active Problem List   Diagnosis Date Noted  . 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 Perimenopausal 09/13/2016  . Hyperlipidemia associated with type 2 diabetes mellitus ([place]) 07/22/2014  . Esophageal reflux 07/22/2014  . Diabetes ([place]) 04/22/2014  . Essential hypertension, benign 04/22/2014    Past Surgical History:  Procedure Laterality Date  . CHOLECYSTECTOMY       OB History   No obstetric history on file.      Home Medications    Prior to Admission medications   Medication Sig Start Date End Date Taking? Authorizing  Provider  acetaminophen (TYLENOL) 325 MG tablet Take 650 mg by mouth daily as needed for moderate pain or headache.   Yes [provider]  aspirin EC 81 MG tablet Take 1 tablet (81 mg total) by mouth daily. 06/02/19  Yes Ladell Pier, MD  glimepiride (AMARYL) 2 MG tablet Take 1 tablet (2 mg total) by mouth daily before breakfast. 06/10/19  Yes Ladell Pier, MD  losartan (COZAAR) 50 MG tablet TAKE 1 TABLET ONE TIME DAILY  (STOP LISINOPRIL) Patient taking differently: Take 50 mg by mouth daily.  06/02/19  Yes Ladell Pier, MD  metFORMIN (GLUCOPHAGE) 1000 MG tablet Take 1 tablet (1,000 mg total) by mouth 2 (two) times daily with a meal. 06/02/19  Yes Ladell Pier, MD  Multiple Vitamin (MULTIVITAMIN WITH MINERALS) TABS tablet Take 1 tablet by mouth daily. 10/17/17  Yes McClung, Angela M, PA-C  Omega-3 Fatty Acids (FISH OIL) 1000 MG CAPS Take 1 capsule (1,000 mg total) by mouth daily. 10/17/17  Yes McClung, Angela M, PA-C  simvastatin (ZOCOR) 20 MG tablet Take 1 tablet (20 mg total) by mouth at bedtime. 06/02/19  Yes Ladell Pier, MD  tetrahydrozoline 0.05 % ophthalmic solution Place 2 drops into both eyes daily as needed (for dry eyes).   Yes [provider]  venlafaxine XR (EFFEXOR-XR) 75 MG 24 hr capsule Take 1 capsule (75 mg total) by mouth daily with breakfast.  06/02/19  Yes Ladell Pier, MD  Blood Glucose Calibration (ACCU-CHEK SMARTVIEW CONTROL) LIQD Use as directed 10/18/17   Tresa Garter, MD  Blood Glucose Monitoring Suppl (ACCU-CHEK NANO SMARTVIEW) w/Device KIT Use as directed for once daily testing of blood sugar. E11.9 10/18/17   Argentina Donovan, PA-C  ketoconazole (NIZORAL) 2 % cream Apply to affected area daily 05/26/18   Ladell Pier, MD    Family History Family History  Problem Relation Age of Onset  . Hypertension Mother   . Hypertension Father   . Heart disease Father   . Stroke Other   . Diabetes Other   . Arthritis Other    . Hypertension Sister   . Hypertension Brother   . Colon cancer Neg Hx     Social History Social History   Tobacco Use  . Smoking status: Never Smoker  . Smokeless tobacco: Never Used  Substance Use Topics  . Alcohol use: No  . Drug use: No     Allergies   Patient has no known allergies.   Review of Systems Review of Systems  Constitutional: Negative for chills and fever.  HENT: Negative for rhinorrhea and sore throat.   Eyes: Negative for redness.  Respiratory: Positive for cough. Negative for shortness of breath.   Cardiovascular: Negative for chest pain.  Gastrointestinal: Positive for abdominal pain and diarrhea. Negative for nausea and vomiting.  Genitourinary: Positive for flank pain. Negative for dysuria.  Musculoskeletal: Negative for myalgias.  Skin: Negative for rash.  Neurological: Negative for headaches.     Physical Exam Updated Vital Signs BP (!) 123/91   Pulse (!) 108   Temp 99.8 F (37.7 C) (Oral)   Resp 16   Wt 86 kg   LMP 02/21/2016   SpO2 94%   BMI 35.82 kg/m   Physical Exam Vitals signs and nursing note reviewed.  Constitutional:      Appearance: She is well-developed.  HENT:     Head: Normocephalic and atraumatic.  Eyes:     General:        Right eye: No discharge.        Left eye: No discharge.     Conjunctiva/sclera: Conjunctivae normal.  Neck:     Musculoskeletal: Normal range of motion and neck supple.  Cardiovascular:     Rate and Rhythm: Regular rhythm. Tachycardia present.     Heart sounds: Normal heart sounds.  Pulmonary:     Effort: Pulmonary effort is normal.     Breath sounds: Examination of the left-lower field reveals rales. Rales present.  Abdominal:     Palpations: Abdomen is soft.     Tenderness: There is no abdominal tenderness.  Musculoskeletal:     Right lower leg: No edema.     Left lower leg: No edema.  Skin:    General: Skin is warm and dry.  Neurological:     Mental Status: She is alert.       ED Treatments / Results  Labs (all labs ordered are listed, but only abnormal results are displayed) Labs Reviewed  SARS CORONAVIRUS 2 ([facility]) - Abnormal; Notable for the following components:      Result Value   SARS Coronavirus 2 POSITIVE (*)    All other components within normal limits  URINALYSIS, ROUTINE W REFLEX MICROSCOPIC - Abnormal; Notable for the following components:   APPearance CLOUDY (*)    Glucose, UA 50 (*)    Hgb urine  dipstick SMALL (*)    Protein, ur 100 (*)    Bacteria, UA RARE (*)    All other components within normal limits  CBC WITH DIFFERENTIAL/PLATELET - Abnormal; Notable for the following components:   RBC 3.81 (*)    Hemoglobin 10.8 (*)    HCT 32.6 (*)    nRBC 0.4 (*)    Abs Immature Granulocytes 0.25 (*)    All other components within normal limits  COMPREHENSIVE METABOLIC PANEL - Abnormal; Notable for the following components:   Glucose, Bld 132 (*)    BUN 32 (*)    Creatinine, Ser 2.01 (*)    Calcium 8.8 (*)    Total Protein 8.3 (*)    AST 56 (*)    ALT 63 (*)    GFR calc non Af Amer 27 (*)    GFR calc Af Amer 31 (*)    All other components within normal limits  LIPASE, BLOOD  LACTIC ACID, PLASMA  BRAIN NATRIURETIC PEPTIDE    EKG EKG Interpretation  Date/Time:  Sunday September 27 2019 20:09:51 EDT Ventricular Rate:  93 PR Interval:  146 QRS Duration: 76 QT Interval:  360 QTC Calculation: 447 R Axis:   39 Text Interpretation:  Normal sinus rhythm Possible Left atrial enlargement Nonspecific T wave abnormality Abnormal ECG when comapred to prior, no significant cahnges seen.  No STeMI Confirmed by Antony Blackbird [phone]) on 09/27/2019 10:27:32 PM   Radiology Dg Chest Portable 1 View  Result Date: 09/27/2019 CLINICAL DATA:  Flank pain EXAM: PORTABLE CHEST 1 VIEW COMPARISON:  01/06/2014 FINDINGS: Shallow lung inflation with bibasilar opacities. No pneumothorax or sizable pleural effusion.  Cardiomediastinal contours are normal. IMPRESSION: Shallow lung inflation with bibasilar opacities, which may indicate atelectasis or developing consolidation. Electronically Signed   By: Ulyses Jarred M.D.   On: 09/27/2019 19:53    Procedures Procedures (including critical care time)  Medications Ordered in ED Medications  0.9 %  sodium chloride infusion ( Intravenous New Bag/Given 09/27/19 2353)     Initial Impression / Assessment and Plan / ED Course  I have reviewed the triage vital signs and the nursing notes.  Pertinent labs & imaging results that were available during my care of the patient were reviewed by me and considered in my medical decision making (see chart for details).        Patient seen and examined. Work-up initiated. Medications ordered.   Vital signs reviewed and are as follows: BP (!) 123/91   Pulse (!) 108   Temp 99.8 F (37.7 C) (Oral)   Resp 16   Wt 86 kg   LMP 02/21/2016   SpO2 94%   BMI 35.82 kg/m   Pt ambulated, maintained good pulse ox. Creatinine 1.2 >> 2.0.   11:24 PM COVID +.   BP 113/73   Pulse 95   Temp 99.8 F (37.7 C) (Oral)   Resp 20   Wt 86 kg   LMP 02/21/2016   SpO2 96%   BMI 35.82 kg/m   12:05 AM Spoke with Dr. Blaine Hamper who will see.   Final Clinical Impressions(s) / ED Diagnoses   Final diagnoses:  Pneumonia due to COVID-19 virus  Acute kidney injury ([place])   Admit.   ED Discharge Orders    None       Carlisle Cater, PA-C 09/28/19 0007    Tegeler, Gwenyth Allegra, MD 09/28/19 [phone]

## 2019-09-27 NOTE — ED Notes (Signed)
Pt ambulated in hallway with steady gait. Oxygen saturations remained above 93% and pt complained of no shortness of breath while ambulating.

## 2019-09-27 NOTE — H&P (Signed)
History and Physical    [name] Barker KPV:[id number] DOB: 27-Mar-1961 DOA: 09/27/2019  Referring MD/NP/PA:   PCP: Ladell Pier, MD   Patient coming from:  The patient is coming from home.  At baseline, pt is independent for most of ADL.        Chief Complaint: Bilateral flank pain, cough  HPI: [name] Barker is a 58 y.o. female with medical history significant of hypertension, hyperlipidemia, diabetes mellitus, cataract, CKD stage III, who presents with bilateral flank pain and cough.  Patient states that she has been having bilateral flank pain in the past 3 days, which is sharp, 7 out of 10 in severity, nonradiating.  It is not pleuritic, deep breath does not change her flank pain.  No hematuria.  Denies symptoms of UTI.  She also has mild cough, no shortness of breath or chest pain.  Denies fever or chills.  Patient states she has nausea and vomited minimally, and with mild diarrhea and mild abdominal discomfort.  No unilateral weakness.  ED Course: pt was found to have positive COVID-19 test, BMP 33.6, lactic acid 1.7, negative urinalysis, WBC 6.9, worsening renal function, lipase 47, temperature normal, blood pressure 113/73, tachycardia, oxygen saturation 94-100% on room air, chest x-ray showed possible bilateral basilar infiltration.  Patient is admitted to telemetry bed for observation.  Review of Systems:   General: no fevers, chills, no body weight gain, has poor appetite, has fatigue HEENT: no blurry vision, hearing changes or sore throat  Respiratory: no dyspnea, coughing, wheezing CV: no chest pain, no palpitations GI: has nausea, vomiting, abdominal discomfort, diarrhea, no constipation GU: no dysuria, burning on urination, increased urinary frequency, hematuria.  Has bilateral flank pain. Ext: no leg edema Neuro: no unilateral weakness, numbness, or tingling, no vision change or hearing loss Skin: no rash, no skin tear. MSK: No muscle spasm, no deformity, no  limitation of range of movement in spin Heme: No easy bruising.  Travel history: No recent long distant travel.  Allergy: No Known Allergies  Past Medical History:  Diagnosis Date  . Cataract 01/2018   BL - Dr. Gershon Crane  . Diabetes mellitus Dx 1998  . High cholesterol   . Hip pain   . Other and unspecified hyperlipidemia   . Personal history of unspecified circulatory disease   . S/P cardiac cath June 2008   abnormal cardiolite. LVH..question on prior studies and question septal hypertroph...not seen echo... EF 65%    Past Surgical History:  Procedure Laterality Date  . CHOLECYSTECTOMY      Social History:  reports that she has never smoked. She has never used smokeless tobacco. She reports that she does not drink alcohol or use drugs.  Family History:  Family History  Problem Relation Age of Onset  . Hypertension Mother   . Hypertension Father   . Heart disease Father   . Stroke Other   . Diabetes Other   . Arthritis Other   . Hypertension Sister   . Hypertension Brother   . Colon cancer Neg Hx      Prior to Admission medications   Medication Sig Start Date End Date Taking? Authorizing Provider  acetaminophen (TYLENOL) 325 MG tablet Take 650 mg by mouth daily as needed for moderate pain or headache.   Yes [provider]  aspirin EC 81 MG tablet Take 1 tablet (81 mg total) by mouth daily. 06/02/19  Yes Ladell Pier, MD  glimepiride (AMARYL) 2 MG tablet Take 1 tablet (2 mg total)  by mouth daily before breakfast. 06/10/19  Yes Ladell Pier, MD  losartan (COZAAR) 50 MG tablet TAKE 1 TABLET ONE TIME DAILY  (STOP LISINOPRIL) Patient taking differently: Take 50 mg by mouth daily.  06/02/19  Yes Ladell Pier, MD  metFORMIN (GLUCOPHAGE) 1000 MG tablet Take 1 tablet (1,000 mg total) by mouth 2 (two) times daily with a meal. 06/02/19  Yes Ladell Pier, MD  Multiple Vitamin (MULTIVITAMIN WITH MINERALS) TABS tablet Take 1 tablet by mouth daily. 10/17/17   Yes McClung, Angela M, PA-C  Omega-3 Fatty Acids (FISH OIL) 1000 MG CAPS Take 1 capsule (1,000 mg total) by mouth daily. 10/17/17  Yes McClung, Angela M, PA-C  simvastatin (ZOCOR) 20 MG tablet Take 1 tablet (20 mg total) by mouth at bedtime. 06/02/19  Yes Ladell Pier, MD  tetrahydrozoline 0.05 % ophthalmic solution Place 2 drops into both eyes daily as needed (for dry eyes).   Yes [provider]  venlafaxine XR (EFFEXOR-XR) 75 MG 24 hr capsule Take 1 capsule (75 mg total) by mouth daily with breakfast. 06/02/19  Yes Ladell Pier, MD  Blood Glucose Calibration (ACCU-CHEK SMARTVIEW CONTROL) LIQD Use as directed 10/18/17   Tresa Garter, MD  Blood Glucose Monitoring Suppl (ACCU-CHEK NANO SMARTVIEW) w/Device KIT Use as directed for once daily testing of blood sugar. E11.9 10/18/17   Argentina Donovan, PA-C  ketoconazole (NIZORAL) 2 % cream Apply to affected area daily 05/26/18   Ladell Pier, MD    Physical Exam: Vitals:   09/28/19 0055 09/28/19 0100 09/28/19 0127 09/28/19 0130  BP: 120/73 120/73 124/79 119/78  Pulse: (!) 104  91 93  Resp: (!) 23  (!) 24 (!) 22  Temp:      TempSrc:      SpO2: 93% 93% 92% 91%  Weight:      Height:       General: Not in acute distress HEENT:       Eyes: PERRL, EOMI, no scleral icterus.       ENT: No discharge from the ears and nose, no pharynx injection, no tonsillar enlargement.        Neck: No JVD, no bruit, no mass felt. Heme: No neck lymph node enlargement. Cardiac: S1/S2, RRR, No murmurs, No gallops or rubs. Respiratory:  No rales, wheezing, rhonchi or rubs. GI: Soft, nondistended, nontender, no rebound pain, no organomegaly, BS present. GU: No hematuria.  Positive CVA tenderness bilaterally. Ext: No pitting leg edema bilaterally. 2+DP/PT pulse bilaterally. Musculoskeletal: No joint deformities, No joint redness or warmth, no limitation of ROM in spin. Skin: No rashes.  Neuro: Alert, oriented X3, cranial nerves II-XII  grossly intact, moves all extremities normally. Psych: Patient is not psychotic, no suicidal or hemocidal ideation.  Labs on Admission: I have personally reviewed following labs and imaging studies  CBC: Recent Labs  Lab 09/27/19 1930  WBC 6.9  NEUTROABS 4.3  HGB 10.8*  HCT 32.6*  MCV 85.6  PLT 540   Basic Metabolic Panel: Recent Labs  Lab 09/27/19 1930  NA 137  K 4.5  CL 103  CO2 22  GLUCOSE 132*  BUN 32*  CREATININE 2.01*  CALCIUM 8.8*   GFR: Estimated Creatinine Clearance: 29.7 mL/min (A) (by C-G formula based on SCr of 2.01 mg/dL (H)). Liver Function Tests: Recent Labs  Lab 09/27/19 1930  AST 56*  ALT 63*  ALKPHOS 66  BILITOT 0.8  PROT 8.3*  ALBUMIN 3.9   Recent Labs  Lab  09/27/19 1930  LIPASE 47   No results for input(s): AMMONIA in the last 168 hours. Coagulation Profile: No results for input(s): INR, PROTIME in the last 168 hours. Cardiac Enzymes: No results for input(s): CKTOTAL, CKMB, CKMBINDEX, TROPONINI in the last 168 hours. BNP (last 3 results) No results for input(s): PROBNP in the last 8760 hours. HbA1C: No results for input(s): HGBA1C in the last 72 hours. CBG: No results for input(s): GLUCAP in the last 168 hours. Lipid Profile: No results for input(s): CHOL, HDL, LDLCALC, TRIG, CHOLHDL, LDLDIRECT in the last 72 hours. Thyroid Function Tests: No results for input(s): TSH, T4TOTAL, FREET4, T3FREE, THYROIDAB in the last 72 hours. Anemia Panel: No results for input(s): VITAMINB12, FOLATE, FERRITIN, TIBC, IRON, RETICCTPCT in the last 72 hours. Urine analysis:    Component Value Date/Time   COLORURINE YELLOW 09/27/2019 1952   APPEARANCEUR CLOUDY (A) 09/27/2019 1952   APPEARANCEUR Clear 08/29/2018 1137   LABSPEC 1.014 09/27/2019 1952   PHURINE 5.0 09/27/2019 1952   GLUCOSEU 50 (A) 09/27/2019 1952   HGBUR SMALL (A) 09/27/2019 [place] NEGATIVE 09/27/2019 1952   BILIRUBINUR Negative 08/29/2018 1137   [place] NEGATIVE  09/27/2019 1952   PROTEINUR 100 (A) 09/27/2019 1952   NITRITE NEGATIVE 09/27/2019 1952   LEUKOCYTESUR NEGATIVE 09/27/2019 1952   Sepsis Labs: '@LABRCNTIP' (procalcitonin:4,lacticidven:4) ) Recent Results (from the past 240 hour(s))  SARS Coronavirus 2 [facility] order, Performed in [facility] hospital lab) Nasopharyngeal Nasopharyngeal Swab     Status: Abnormal   Collection Time: 09/27/19  7:52 PM   Specimen: Nasopharyngeal Swab  Result Value Ref Range Status   SARS Coronavirus 2 POSITIVE (A) NEGATIVE Final    Comment: RESULT CALLED TO, READ BACK BY AND VERIFIED WITH: C,FRANKLIN AT 2305 ON 09/27/19 BY A,MOHAMED (NOTE) If result is NEGATIVE SARS-CoV-2 target nucleic acids are NOT DETECTED. The SARS-CoV-2 RNA is generally detectable in upper and lower  respiratory specimens during the acute phase of infection. The lowest  concentration of SARS-CoV-2 viral copies this assay can detect is 250  copies / mL. A negative result does not preclude SARS-CoV-2 infection  and should not be used as the sole basis for treatment or other  patient management decisions.  A negative result may occur with  improper specimen collection / handling, submission of specimen other  than nasopharyngeal swab, presence of viral mutation(s) within the  areas targeted by this assay, and inadequate number of viral copies  (<250 copies / mL). A negative result must be combined with clinical  observations, patient history, and epidemiological information. If result is POSITIVE SARS-CoV-2 target nucleic acids are DETEC TED. The SARS-CoV-2 RNA is generally detectable in upper and lower  respiratory specimens during the acute phase of infection.  Positive  results are indicative of active infection with SARS-CoV-2.  Clinical  correlation with patient history and other diagnostic information is  necessary to determine patient infection status.  Positive results do  not rule out bacterial infection or co-infection with  other viruses. If result is PRESUMPTIVE POSTIVE SARS-CoV-2 nucleic acids MAY BE PRESENT.   A presumptive positive result was obtained on the submitted specimen  and confirmed on repeat testing.  While 2019 novel coronavirus  (SARS-CoV-2) nucleic acids may be present in the submitted sample  additional confirmatory testing may be necessary for epidemiological  and / or clinical management purposes  to differentiate between  SARS-CoV-2 and other Sarbecovirus currently known to infect humans.  If clinically indicated additional testing with an alternate test  methodology (LAB7 453) is advised. The SARS-CoV-2 RNA is generally  detectable in upper and lower respiratory specimens during the acute  phase of infection. The expected result is Negative. Fact Sheet for Patients:  StrictlyIdeas.no Fact Sheet for Healthcare Providers: BankingDealers.co.za This test is not yet approved or cleared by the [place] FDA and has been authorized for detection and/or diagnosis of SARS-CoV-2 by FDA under an Emergency Use Authorization (EUA).  This EUA will remain in effect (meaning this test can be used) for the duration of the COVID-19 declaration under Section 564(b)(1) of the Act, 21 U.S.C. section 360bbb-3(b)(1), unless the authorization is terminated or revoked sooner. Performed at [facility], [place] [address], [place], [place] [address]      Radiological Exams on Admission: Dg Chest Portable 1 View  Result Date: 09/27/2019 CLINICAL DATA:  Flank pain EXAM: PORTABLE CHEST 1 VIEW COMPARISON:  01/06/2014 FINDINGS: Shallow lung inflation with bibasilar opacities. No pneumothorax or sizable pleural effusion. Cardiomediastinal contours are normal. IMPRESSION: Shallow lung inflation with bibasilar opacities, which may indicate atelectasis or developing consolidation. Electronically Signed   By: Ulyses Jarred M.D.   On: 09/27/2019 19:53      EKG: Independently reviewed.  Sinus rhythm, QTC 447, LAE, nonspecific T wave change.   Assessment/Plan Principal Problem:   COVID-19 virus infection Active Problems:   Type II diabetes mellitus with renal manifestations (HCC)   Acute renal failure superimposed on stage 3 chronic kidney disease (HCC)   HTN (hypertension)   HLD (hyperlipidemia)   Abnormal LFTs   Flank pain   COVID-19 virus infection: Patient has cough, but no shortness of breath.  No oxygen desaturation.  Chest x-ray showed possible bilateral basilar infiltration.  Clinically not septic.  Does not meet criteria for using Remdesivir.  -will place on tele bed for obs -Solumedrol 40 mg bid -vitamin C, zinc.   -Xopenex or albuterol inhaler -PRN Mucinex for cough -f/u Blood culture -Gentle IV fluid: 50 cc/h of NS due to worsening renal function -D-dimer, BNP,Trop, LFT, CRP, LDH, Procalcitonin, IL-6, ferritin, fibinogen, TG, HIV ab -Daily CRP, Ferritin, D-dimer, -if patient develops oxygen desaturation, will ask the patient to maintain an awake prone position for 16+ hours a day, if possible, with a minimum of 2-3 hours at a time -Patient was seen wearing full PPE including: gown, gloves, head cover, N95  Type II diabetes mellitus with renal manifestations ([place]): Last A1c 8.2 on 06/05/19, poorly controled. Patient is taking Amaryl and metformin at home -SSI  Acute renal failure superimposed on stage 3 chronic kidney disease [facility]): Baseline Cre is  1.0-1.2, pt's Cre is 2.01 and BUN 32 on admission. Likely due to dehydration and continuation of ARB  - IVF: NS at 50 cc/h - Follow up renal function by BMP - Avoid using renal toxic medications. - Check FeNa  - Hold cozarr - f/u CT-renal stone   HTN (hypertension): Bp 113/73. -hold Cozarr due to worsening renal function -Hydralazine as needed  HLD (hyperlipidemia):  -zocor  Abnormal LFTs: Mild abnormal liver function, possibly due to COVID-19 infection. -Check  hepatitis panel and HIV antibody  Flank pain: May be due to COVID-19 infection.  Patient has possible bilateral baseline infiltration.  Given worsening renal function, will need to rule out kidney stone.  Another potential differential diagnosis is PE given COVID-19 infection.  Will do stepwise work-up.  Get CT scan per renal stone protocol first.  If it is negative for stone.  We will follow-up d-dimer.  If d-dimer is positive, will give 1 dose of Lovenox tonight.  Get VQ scan in the morning. -CT scan per renal stone protocol -As needed Percocet for pain -PRN Zofran for nausea     DVT ppx:  SQ heparin Code Status: Full code Family Communication: None at bed side. Disposition Plan:  Anticipate discharge back to previous home environment Consults called:  none Admission status: Obs / tele       Date of Service 09/28/2019    [place] [facility]   If 7PM-7AM, please contact night-coverage [email or web address] Password [email or web address] 09/28/2019, 1:49 AM

## 2019-09-27 NOTE — ED Notes (Signed)
X-ray at bedside

## 2019-09-27 NOTE — ED Notes (Signed)
Pharmacy at bedside

## 2019-09-27 NOTE — ED Triage Notes (Signed)
Pt c/o bilateral flank pain that started today. Pt states no issues with urination. Pt states that she had a small amount of diarrhea as well. Denies N/V.

## 2019-09-27 NOTE — ED Notes (Signed)
EDPA student at bedside.  

## 2019-09-27 NOTE — ED Notes (Signed)
Pt ambulatory to BR with steady gait.

## 2019-09-28 ENCOUNTER — Inpatient Hospital Stay (HOSPITAL_COMMUNITY): Payer: Medicare PPO

## 2019-09-28 DIAGNOSIS — N183 Chronic kidney disease, stage 3 unspecified: Secondary | ICD-10-CM | POA: Diagnosis present

## 2019-09-28 DIAGNOSIS — E7800 Pure hypercholesterolemia, unspecified: Secondary | ICD-10-CM | POA: Diagnosis present

## 2019-09-28 DIAGNOSIS — E1129 Type 2 diabetes mellitus with other diabetic kidney complication: Secondary | ICD-10-CM | POA: Diagnosis present

## 2019-09-28 DIAGNOSIS — Z6834 Body mass index (BMI) 34.0-34.9, adult: Secondary | ICD-10-CM | POA: Diagnosis not present

## 2019-09-28 DIAGNOSIS — J1289 Other viral pneumonia: Secondary | ICD-10-CM | POA: Diagnosis present

## 2019-09-28 DIAGNOSIS — N179 Acute kidney failure, unspecified: Secondary | ICD-10-CM | POA: Diagnosis present

## 2019-09-28 DIAGNOSIS — Z8249 Family history of ischemic heart disease and other diseases of the circulatory system: Secondary | ICD-10-CM | POA: Diagnosis not present

## 2019-09-28 DIAGNOSIS — E860 Dehydration: Secondary | ICD-10-CM | POA: Diagnosis present

## 2019-09-28 DIAGNOSIS — E1122 Type 2 diabetes mellitus with diabetic chronic kidney disease: Secondary | ICD-10-CM | POA: Diagnosis present

## 2019-09-28 DIAGNOSIS — J9601 Acute respiratory failure with hypoxia: Secondary | ICD-10-CM | POA: Diagnosis present

## 2019-09-28 DIAGNOSIS — R945 Abnormal results of liver function studies: Secondary | ICD-10-CM | POA: Diagnosis present

## 2019-09-28 DIAGNOSIS — R109 Unspecified abdominal pain: Secondary | ICD-10-CM | POA: Diagnosis present

## 2019-09-28 DIAGNOSIS — E785 Hyperlipidemia, unspecified: Secondary | ICD-10-CM | POA: Diagnosis present

## 2019-09-28 DIAGNOSIS — N281 Cyst of kidney, acquired: Secondary | ICD-10-CM | POA: Diagnosis present

## 2019-09-28 DIAGNOSIS — F329 Major depressive disorder, single episode, unspecified: Secondary | ICD-10-CM | POA: Diagnosis present

## 2019-09-28 DIAGNOSIS — R7989 Other specified abnormal findings of blood chemistry: Secondary | ICD-10-CM | POA: Diagnosis present

## 2019-09-28 DIAGNOSIS — U071 COVID-19: Secondary | ICD-10-CM | POA: Diagnosis present

## 2019-09-28 DIAGNOSIS — Z9049 Acquired absence of other specified parts of digestive tract: Secondary | ICD-10-CM | POA: Diagnosis not present

## 2019-09-28 DIAGNOSIS — Z7982 Long term (current) use of aspirin: Secondary | ICD-10-CM | POA: Diagnosis not present

## 2019-09-28 DIAGNOSIS — I10 Essential (primary) hypertension: Secondary | ICD-10-CM | POA: Diagnosis not present

## 2019-09-28 DIAGNOSIS — Z7984 Long term (current) use of oral hypoglycemic drugs: Secondary | ICD-10-CM | POA: Diagnosis not present

## 2019-09-28 DIAGNOSIS — Z833 Family history of diabetes mellitus: Secondary | ICD-10-CM | POA: Diagnosis not present

## 2019-09-28 LAB — CBC WITH DIFFERENTIAL/PLATELET
Abs Immature Granulocytes: 0.26 10*3/uL — ABNORMAL HIGH (ref 0.00–0.07)
Basophils Absolute: 0 10*3/uL (ref 0.0–0.1)
Basophils Relative: 1 %
Eosinophils Absolute: 0.1 10*3/uL (ref 0.0–0.5)
Eosinophils Relative: 1 %
HCT: 31.3 % — ABNORMAL LOW (ref 36.0–46.0)
Hemoglobin: 10.6 g/dL — ABNORMAL LOW (ref 12.0–15.0)
Immature Granulocytes: 4 %
Lymphocytes Relative: 23 %
Lymphs Abs: 1.5 10*3/uL (ref 0.7–4.0)
MCH: 29 pg (ref 26.0–34.0)
MCHC: 33.9 g/dL (ref 30.0–36.0)
MCV: 85.5 fL (ref 80.0–100.0)
Monocytes Absolute: 0.5 10*3/uL (ref 0.1–1.0)
Monocytes Relative: 8 %
Neutro Abs: 3.9 10*3/uL (ref 1.7–7.7)
Neutrophils Relative %: 63 %
Platelets: 371 10*3/uL (ref 150–400)
RBC: 3.66 MIL/uL — ABNORMAL LOW (ref 3.87–5.11)
RDW: 13.3 % (ref 11.5–15.5)
WBC: 6.2 10*3/uL (ref 4.0–10.5)
nRBC: 0.3 % — ABNORMAL HIGH (ref 0.0–0.2)

## 2019-09-28 LAB — COMPREHENSIVE METABOLIC PANEL
ALT: 69 U/L — ABNORMAL HIGH (ref 0–44)
AST: 91 U/L — ABNORMAL HIGH (ref 15–41)
Albumin: 3.4 g/dL — ABNORMAL LOW (ref 3.5–5.0)
Alkaline Phosphatase: 85 U/L (ref 38–126)
Anion gap: 12 (ref 5–15)
BUN: 36 mg/dL — ABNORMAL HIGH (ref 6–20)
CO2: 21 mmol/L — ABNORMAL LOW (ref 22–32)
Calcium: 8.7 mg/dL — ABNORMAL LOW (ref 8.9–10.3)
Chloride: 105 mmol/L (ref 98–111)
Creatinine, Ser: 2.3 mg/dL — ABNORMAL HIGH (ref 0.44–1.00)
GFR calc Af Amer: 26 mL/min — ABNORMAL LOW (ref >60)
GFR calc non Af Amer: 23 mL/min — ABNORMAL LOW (ref >60)
Glucose, Bld: 128 mg/dL — ABNORMAL HIGH (ref 70–99)
Potassium: 4.6 mmol/L (ref 3.5–5.1)
Sodium: 138 mmol/L (ref 135–145)
Total Bilirubin: 0.9 mg/dL (ref 0.3–1.2)
Total Protein: 7.5 g/dL (ref 6.5–8.1)

## 2019-09-28 LAB — HEPATITIS PANEL, ACUTE
HCV Ab: NONREACTIVE
Hep A IgM: NONREACTIVE
Hep B C IgM: NONREACTIVE
Hepatitis B Surface Ag: NONREACTIVE

## 2019-09-28 LAB — SODIUM, URINE, RANDOM
Sodium, Ur: <10 mmol/L
Sodium, Ur: 24 mmol/L

## 2019-09-28 LAB — GLUCOSE, CAPILLARY
Glucose-Capillary: 171 mg/dL — ABNORMAL HIGH (ref 70–99)
Glucose-Capillary: 321 mg/dL — ABNORMAL HIGH (ref 70–99)
Glucose-Capillary: 360 mg/dL — ABNORMAL HIGH (ref 70–99)

## 2019-09-28 LAB — MRSA PCR SCREENING: MRSA by PCR: POSITIVE — AB

## 2019-09-28 LAB — TYPE AND SCREEN
ABO/RH(D): O POS
Antibody Screen: NEGATIVE

## 2019-09-28 LAB — FERRITIN: Ferritin: 564 ng/mL — ABNORMAL HIGH (ref 11–307)

## 2019-09-28 LAB — PROCALCITONIN: Procalcitonin: 0.15 ng/mL

## 2019-09-28 LAB — D-DIMER, QUANTITATIVE: D-Dimer, Quant: 2.44 ug/mL-FEU — ABNORMAL HIGH (ref 0.00–0.50)

## 2019-09-28 LAB — CREATININE, URINE, RANDOM
Creatinine, Urine: 210.67 mg/dL
Creatinine, Urine: 87.86 mg/dL

## 2019-09-28 LAB — TROPONIN I (HIGH SENSITIVITY)
Troponin I (High Sensitivity): 5 ng/L (ref <18)
Troponin I (High Sensitivity): 5 ng/L (ref <18)

## 2019-09-28 LAB — C-REACTIVE PROTEIN: CRP: 2.9 mg/dL — ABNORMAL HIGH (ref <1.0)

## 2019-09-28 LAB — FIBRINOGEN: Fibrinogen: 674 mg/dL — ABNORMAL HIGH (ref 210–475)

## 2019-09-28 LAB — HIV ANTIBODY (ROUTINE TESTING W REFLEX): HIV Screen 4th Generation wRfx: NONREACTIVE

## 2019-09-28 LAB — ABO/RH: ABO/RH(D): O POS

## 2019-09-28 LAB — LACTATE DEHYDROGENASE: LDH: 441 U/L — ABNORMAL HIGH (ref 98–192)

## 2019-09-28 MED ORDER — ASPIRIN EC 81 MG PO TBEC
81.0000 mg | DELAYED_RELEASE_TABLET | Freq: Every day | ORAL | Status: DC
  Administered 2019-09-28 – 2019-10-02 (×5): 81 mg via ORAL
  Filled 2019-09-28 (×5): qty 1

## 2019-09-28 MED ORDER — SIMVASTATIN 20 MG PO TABS
20.0000 mg | ORAL_TABLET | Freq: Every day | ORAL | Status: DC
  Administered 2019-09-28 – 2019-10-01 (×4): 20 mg via ORAL
  Filled 2019-09-28 (×4): qty 1

## 2019-09-28 MED ORDER — ACETAMINOPHEN 325 MG PO TABS: 650.0000 mg | ORAL_TABLET | Freq: Four times a day (QID) | ORAL | Status: DC | PRN

## 2019-09-28 MED ORDER — INSULIN ASPART 100 UNIT/ML ~~LOC~~ SOLN
0.0000 [IU] | Freq: Three times a day (TID) | SUBCUTANEOUS | Status: DC
  Administered 2019-09-28: 12:00:00 7 [IU] via SUBCUTANEOUS
  Administered 2019-09-28: 08:00:00 2 [IU] via SUBCUTANEOUS

## 2019-09-28 MED ORDER — SODIUM CHLORIDE 0.9 % IV SOLN
200.0000 mg | Freq: Once | INTRAVENOUS | Status: AC
  Administered 2019-09-28: 12:00:00 200 mg via INTRAVENOUS
  Filled 2019-09-28: qty 40

## 2019-09-28 MED ORDER — HEPARIN SODIUM (PORCINE) 5000 UNIT/ML IJ SOLN
5000.0000 [IU] | Freq: Three times a day (TID) | INTRAMUSCULAR | Status: DC
  Administered 2019-09-28 – 2019-09-30 (×7): 5000 [IU] via SUBCUTANEOUS
  Filled 2019-09-28 (×7): qty 1

## 2019-09-28 MED ORDER — OXYCODONE-ACETAMINOPHEN 5-325 MG PO TABS
1.0000 | ORAL_TABLET | ORAL | Status: DC | PRN
  Administered 2019-09-28: 1 via ORAL
  Filled 2019-09-28: qty 1

## 2019-09-28 MED ORDER — DEXAMETHASONE SODIUM PHOSPHATE 10 MG/ML IJ SOLN
8.0000 mg | Freq: Once | INTRAMUSCULAR | Status: AC
  Administered 2019-09-28: 8 mg via INTRAVENOUS
  Filled 2019-09-28: qty 1

## 2019-09-28 MED ORDER — ZINC SULFATE 220 (50 ZN) MG PO CAPS
220.0000 mg | ORAL_CAPSULE | Freq: Every day | ORAL | Status: DC
  Administered 2019-09-28 – 2019-10-02 (×5): 220 mg via ORAL
  Filled 2019-09-28 (×5): qty 1

## 2019-09-28 MED ORDER — VENLAFAXINE HCL ER 75 MG PO CP24
75.0000 mg | ORAL_CAPSULE | Freq: Every day | ORAL | Status: DC
  Administered 2019-09-28 – 2019-10-02 (×5): 75 mg via ORAL
  Filled 2019-09-28 (×6): qty 1

## 2019-09-28 MED ORDER — SODIUM CHLORIDE 0.9 % IV SOLN
INTRAVENOUS | Status: DC
  Administered 2019-09-29 – 2019-09-30 (×3): via INTRAVENOUS

## 2019-09-28 MED ORDER — METHYLPREDNISOLONE SODIUM SUCC 40 MG IJ SOLR
40.0000 mg | Freq: Two times a day (BID) | INTRAMUSCULAR | Status: DC
  Administered 2019-09-28 (×2): 40 mg via INTRAVENOUS
  Filled 2019-09-28 (×2): qty 1

## 2019-09-28 MED ORDER — OMEGA-3-ACID ETHYL ESTERS 1 G PO CAPS
1.0000 g | ORAL_CAPSULE | Freq: Every day | ORAL | Status: DC
  Administered 2019-09-28 – 2019-10-02 (×5): 1 g via ORAL
  Filled 2019-09-28 (×6): qty 1

## 2019-09-28 MED ORDER — INSULIN ASPART 100 UNIT/ML ~~LOC~~ SOLN
0.0000 [IU] | Freq: Three times a day (TID) | SUBCUTANEOUS | Status: DC
  Administered 2019-09-28: 20 [IU] via SUBCUTANEOUS
  Administered 2019-09-29: 12:00:00 3 [IU] via SUBCUTANEOUS
  Administered 2019-09-29 – 2019-09-30 (×3): 4 [IU] via SUBCUTANEOUS
  Administered 2019-10-01: 17:00:00 7 [IU] via SUBCUTANEOUS
  Administered 2019-10-01: 3 [IU] via SUBCUTANEOUS
  Administered 2019-10-01: 13:00:00 4 [IU] via SUBCUTANEOUS
  Administered 2019-10-02 (×2): 7 [IU] via SUBCUTANEOUS
  Administered 2019-10-02: 08:00:00 4 [IU] via SUBCUTANEOUS

## 2019-09-28 MED ORDER — VITAMIN C 500 MG PO TABS
500.0000 mg | ORAL_TABLET | Freq: Every day | ORAL | Status: DC
  Administered 2019-09-28 – 2019-10-02 (×5): 500 mg via ORAL
  Filled 2019-09-28 (×5): qty 1

## 2019-09-28 MED ORDER — MUPIROCIN 2 % EX OINT
TOPICAL_OINTMENT | Freq: Two times a day (BID) | CUTANEOUS | Status: DC
  Administered 2019-09-28 – 2019-10-02 (×9): via NASAL
  Filled 2019-09-28: qty 22

## 2019-09-28 MED ORDER — INSULIN ASPART 100 UNIT/ML ~~LOC~~ SOLN
0.0000 [IU] | Freq: Every day | SUBCUTANEOUS | Status: DC
  Administered 2019-09-28 – 2019-10-01 (×4): 3 [IU] via SUBCUTANEOUS

## 2019-09-28 MED ORDER — METHYLPREDNISOLONE SODIUM SUCC 40 MG IJ SOLR: 40.0000 mg | Freq: Two times a day (BID) | INTRAMUSCULAR | Status: DC

## 2019-09-28 MED ORDER — DM-GUAIFENESIN ER 30-600 MG PO TB12: 1.0000 | ORAL_TABLET | Freq: Two times a day (BID) | ORAL | Status: DC | PRN

## 2019-09-28 MED ORDER — NAPHAZOLINE-GLYCERIN 0.012-0.2 % OP SOLN
1.0000 [drp] | Freq: Four times a day (QID) | OPHTHALMIC | Status: DC | PRN
  Filled 2019-09-28: qty 15

## 2019-09-28 MED ORDER — LEVALBUTEROL TARTRATE 45 MCG/ACT IN AERO
2.0000 | INHALATION_SPRAY | Freq: Four times a day (QID) | RESPIRATORY_TRACT | Status: DC | PRN
  Filled 2019-09-28: qty 15

## 2019-09-28 MED ORDER — ADULT MULTIVITAMIN W/MINERALS CH
1.0000 | ORAL_TABLET | Freq: Every day | ORAL | Status: DC
  Administered 2019-09-28 – 2019-10-02 (×5): 1 via ORAL
  Filled 2019-09-28 (×5): qty 1

## 2019-09-28 MED ORDER — INSULIN ASPART 100 UNIT/ML ~~LOC~~ SOLN: 0.0000 [IU] | Freq: Every day | SUBCUTANEOUS | Status: DC

## 2019-09-28 MED ORDER — HYDRALAZINE HCL 25 MG PO TABS
25.0000 mg | ORAL_TABLET | Freq: Three times a day (TID) | ORAL | Status: DC | PRN
  Filled 2019-09-28: qty 1

## 2019-09-28 MED ORDER — ONDANSETRON HCL 4 MG/2ML IJ SOLN: 4.0000 mg | Freq: Three times a day (TID) | INTRAMUSCULAR | Status: DC | PRN

## 2019-09-28 MED ORDER — SODIUM CHLORIDE 0.9 % IV SOLN
100.0000 mg | INTRAVENOUS | Status: AC
  Administered 2019-09-29 – 2019-10-02 (×4): 100 mg via INTRAVENOUS
  Filled 2019-09-28 (×4): qty 20

## 2019-09-28 MED ORDER — CHLORHEXIDINE GLUCONATE CLOTH 2 % EX PADS
6.0000 | MEDICATED_PAD | Freq: Every day | CUTANEOUS | Status: AC
  Administered 2019-09-28 – 2019-10-02 (×5): 6 via TOPICAL

## 2019-09-28 NOTE — Progress Notes (Signed)
Remdesivir - Pharmacy Brief Note   O:  ALT: increasing to 69 CXR: Shallow lung inflation with bibasilar opacities, which may indicate atelectasis or developing consolidation SpO2: 91-95% on room air   A/P:  Remdesivir 200 mg once followed by 100 mg daily x 4 days.   Gretta Arab PharmD, BCPS Clinical pharmacist phone 7am- 5pm: [phone] 09/28/2019 11:16 AM

## 2019-09-28 NOTE — ED Notes (Addendum)
ED TO INPATIENT HANDOFF REPORT  ED Nurse Name and Phone #:  Shirl Harris [phone]  S Name/Age/Gender [name] Barker 58 y.o. female Room/Bed: WA10/WA10  Code Status   Code Status: Not on file  Home/SNF/Other Home Patient oriented to: self, place, time and situation Is this baseline? Yes   Triage Complete: Triage complete  Chief Complaint Flank Pain on Both Sides  Triage Note Pt c/o bilateral flank pain that started today. Pt states no issues with urination. Pt states that she had a small amount of diarrhea as well. Denies N/V.   Allergies No Known Allergies  Level of Care/Admitting Diagnosis ED Disposition    ED Disposition Condition Comment   Admit  Hospital Area: [facility] [[id number]]  Level of Care: Telemetry [5]  Covid Evaluation: Confirmed COVID Positive  Diagnosis: COVID-19 virus infection [[id number]]  Admitting Physician: Lorretta Harp [[id number]]  Attending Physician: Lorretta Harp [[id number]]  PT Class (Do Not Modify): Observation [104]  PT Acc Code (Do Not Modify): Observation [10022]       B Medical/Surgery History Past Medical History:  Diagnosis Date  . Cataract 01/2018   BL - Dr. Nile Riggs  . Diabetes mellitus Dx 1998  . High cholesterol   . Hip pain   . Other and unspecified hyperlipidemia   . Personal history of unspecified circulatory disease   . S/P cardiac cath June 2008   abnormal cardiolite. LVH..question on prior studies and question septal hypertroph...not seen echo... EF 65%   Past Surgical History:  Procedure Laterality Date  . CHOLECYSTECTOMY       A IV Location/Drains/Wounds Patient Lines/Drains/Airways Status   Active Line/Drains/Airways    Name:   Placement date:   Placement time:   Site:   Days:   Peripheral IV 09/27/19 Left Antecubital   09/27/19    2007    Antecubital   1          Intake/Output Last 24 hours No intake or output data in the 24 hours ending 09/28/19 0149  Labs/Imaging Results for orders  placed or performed during the hospital encounter of 09/27/19 (from the past 48 hour(s))  CBC with Differential/Platelet     Status: Abnormal   Collection Time: 09/27/19  7:30 PM  Result Value Ref Range   WBC 6.9 4.0 - 10.5 K/uL   RBC 3.81 (L) 3.87 - 5.11 MIL/uL   Hemoglobin 10.8 (L) 12.0 - 15.0 g/dL   HCT 27.5 (L) 17.0 - 01.7 %   MCV 85.6 80.0 - 100.0 fL   MCH 28.3 26.0 - 34.0 pg   MCHC 33.1 30.0 - 36.0 g/dL   RDW 49.4 49.6 - 75.9 %   Platelets 400 150 - 400 K/uL   nRBC 0.4 (H) 0.0 - 0.2 %   Neutrophils Relative % 62 %   Neutro Abs 4.3 1.7 - 7.7 K/uL   Lymphocytes Relative 24 %   Lymphs Abs 1.6 0.7 - 4.0 K/uL   Monocytes Relative 9 %   Monocytes Absolute 0.6 0.1 - 1.0 K/uL   Eosinophils Relative 0 %   Eosinophils Absolute 0.0 0.0 - 0.5 K/uL   Basophils Relative 1 %   Basophils Absolute 0.0 0.0 - 0.1 K/uL   RBC Morphology MORPHOLOGY UNREMARKABLE    Immature Granulocytes 4 %   Abs Immature Granulocytes 0.25 (H) 0.00 - 0.07 K/uL    Comment: Performed at [facility], [address] [place], [place] [address]  Comprehensive metabolic panel     Status:  Abnormal   Collection Time: 09/27/19  7:30 PM  Result Value Ref Range   Sodium 137 135 - 145 mmol/L   Potassium 4.5 3.5 - 5.1 mmol/L   Chloride 103 98 - 111 mmol/L   CO2 22 22 - 32 mmol/L   Glucose, Bld 132 (H) 70 - 99 mg/dL   BUN 32 (H) 6 - 20 mg/dL   Creatinine, Ser 2.01 (H) 0.44 - 1.00 mg/dL   Calcium 8.8 (L) 8.9 - 10.3 mg/dL   Total Protein 8.3 (H) 6.5 - 8.1 g/dL   Albumin 3.9 3.5 - 5.0 g/dL   AST 56 (H) 15 - 41 U/L   ALT 63 (H) 0 - 44 U/L   Alkaline Phosphatase 66 38 - 126 U/L   Total Bilirubin 0.8 0.3 - 1.2 mg/dL   GFR calc non Af Amer 27 (L) >60 mL/min   GFR calc Af Amer 31 (L) >60 mL/min   Anion gap 12 5 - 15    Comment: Performed at [facility], [place] [address], [place], [place] [address]  Lipase, blood     Status: None   Collection Time: 09/27/19  7:30 PM  Result Value Ref  Range   Lipase 47 11 - 51 U/L    Comment: Performed at [facility], [place] [address], [place], [place] [address]  Lactic acid, plasma     Status: None   Collection Time: 09/27/19  7:31 PM  Result Value Ref Range   Lactic Acid, Venous 1.7 0.5 - 1.9 mmol/L    Comment: Performed at [facility], [place] [address], [place], [place] [address]  Brain natriuretic peptide     Status: None   Collection Time: 09/27/19  7:51 PM  Result Value Ref Range   B Natriuretic Peptide 33.6 0.0 - 100.0 pg/mL    Comment: Performed at [facility], [place] [address], [place], [place] [address]  Urinalysis, Routine w reflex microscopic- may I&O cath if menses     Status: Abnormal   Collection Time: 09/27/19  7:52 PM  Result Value Ref Range   Color, Urine YELLOW YELLOW   APPearance CLOUDY (A) CLEAR   Specific Gravity, Urine 1.014 1.005 - 1.030   pH 5.0 5.0 - 8.0   Glucose, UA 50 (A) NEGATIVE mg/dL   Hgb urine dipstick SMALL (A) NEGATIVE   Bilirubin Urine NEGATIVE NEGATIVE   Ketones, ur NEGATIVE NEGATIVE mg/dL   Protein, ur 100 (A) NEGATIVE mg/dL   Nitrite NEGATIVE NEGATIVE   Leukocytes,Ua NEGATIVE NEGATIVE   RBC / HPF 0-5 0 - 5 RBC/hpf   WBC, UA 0-5 0 - 5 WBC/hpf   Bacteria, UA RARE (A) NONE SEEN   Squamous Epithelial / LPF 0-5 0 - 5   Mucus PRESENT     Comment: Performed at [facility], [place] [address], [place], [place] [address]  SARS Coronavirus 2 [facility] order, Performed in [facility] hospital lab) Nasopharyngeal Nasopharyngeal Swab     Status: Abnormal   Collection Time: 09/27/19  7:52 PM   Specimen: Nasopharyngeal Swab  Result Value Ref Range   SARS Coronavirus 2 POSITIVE (A) NEGATIVE    Comment: RESULT CALLED TO, READ BACK BY AND VERIFIED WITH: C,FRANKLIN AT 2305 ON 09/27/19 BY A,MOHAMED (NOTE) If result is NEGATIVE SARS-CoV-2 target nucleic acids are NOT DETECTED. The SARS-CoV-2 RNA is generally detectable in upper and  lower  respiratory specimens during the acute phase of infection. The lowest  concentration of  SARS-CoV-2 viral copies this assay can detect is 250  copies / mL. A negative result does not preclude SARS-CoV-2 infection  and should not be used as the sole basis for treatment or other  patient management decisions.  A negative result may occur with  improper specimen collection / handling, submission of specimen other  than nasopharyngeal swab, presence of viral mutation(s) within the  areas targeted by this assay, and inadequate number of viral copies  (<250 copies / mL). A negative result must be combined with clinical  observations, patient history, and epidemiological information. If result is POSITIVE SARS-CoV-2 target nucleic acids are DETEC TED. The SARS-CoV-2 RNA is generally detectable in upper and lower  respiratory specimens during the acute phase of infection.  Positive  results are indicative of active infection with SARS-CoV-2.  Clinical  correlation with patient history and other diagnostic information is  necessary to determine patient infection status.  Positive results do  not rule out bacterial infection or co-infection with other viruses. If result is PRESUMPTIVE POSTIVE SARS-CoV-2 nucleic acids MAY BE PRESENT.   A presumptive positive result was obtained on the submitted specimen  and confirmed on repeat testing.  While 2019 novel coronavirus  (SARS-CoV-2) nucleic acids may be present in the submitted sample  additional confirmatory testing may be necessary for epidemiological  and / or clinical management purposes  to differentiate between  SARS-CoV-2 and other Sarbecovirus currently known to infect humans.  If clinically indicated additional testing with an alternate test  methodology (LAB7 453) is advised. The SARS-CoV-2 RNA is generally  detectable in upper and lower respiratory specimens during the acute  phase of infection. The expected result is  Negative. Fact Sheet for Patients:  BoilerBrush.com.cy Fact Sheet for Healthcare Providers: https://pope.com/ This test is not yet approved or cleared by the [place] FDA and has been authorized for detection and/or diagnosis of SARS-CoV-2 by FDA under an Emergency Use Authorization (EUA).  This EUA will remain in effect (meaning this test can be used) for the duration of the COVID-19 declaration under Section 564(b)(1) of the Act, 21 U.S.C. section 360bbb-3(b)(1), unless the authorization is terminated or revoked sooner. Performed at [facility], [address], [place], [place] [address]    Dg Chest Portable 1 View  Result Date: 09/27/2019 CLINICAL DATA:  Flank pain EXAM: PORTABLE CHEST 1 VIEW COMPARISON:  01/06/2014 FINDINGS: Shallow lung inflation with bibasilar opacities. No pneumothorax or sizable pleural effusion. Cardiomediastinal contours are normal. IMPRESSION: Shallow lung inflation with bibasilar opacities, which may indicate atelectasis or developing consolidation. Electronically Signed   By: Deatra Robinson M.D.   On: 09/27/2019 19:53    Pending Labs Unresulted Labs (From admission, onward)    Start     Ordered   09/28/19 0130  Creatinine, urine, random  Once,   STAT     09/28/19 0129   09/28/19 0130  Sodium, urine, random  Once,   STAT     09/28/19 0129   09/28/19 0130  Hepatitis panel, acute  Once,   STAT     09/28/19 0129   Signed and Held  HIV Antibody (routine testing w rflx)  (HIV Antibody (Routine testing w reflex) panel)  Once,   R     Signed and Held   Signed and Held  HIV4GL Save Tube  (HIV Antibody (Routine testing w reflex) panel)  Once,   R     Signed and Held   Signed and Held  ABO/Rh  Once,  R     Signed and Held   Signed and Held  Type and screen [facility]  Once,   R    Comments: [facility]    Signed and Held   Signed and Held  C-reactive  protein  Once,   R     Signed and Held   Signed and Held  D-dimer, quantitative (not at [facility])  Once,   R     Signed and Held   Signed and Held  Ferritin  Once,   R     Signed and Held   Signed and [employer]  Once,   R     Signed and Held   Signed and Held  Lactate dehydrogenase  Once,   R     Signed and Held   Signed and Held  Procalcitonin  Once,   R     Signed and Held   Signed and Held  CBC with Differential/Platelet  Daily,   R     Signed and Held   Signed and Held  Comprehensive metabolic panel  Daily,   R     Signed and Held   Signed and Held  C-reactive protein  Daily,   R     Signed and Held   Signed and Held  D-dimer, quantitative (not at [facility])  Daily,   R     Signed and Held   Signed and Held  Ferritin  Daily,   R     Signed and Held   Signed and Held  Culture, blood (Routine X 2) w Reflex to ID Panel  BLOOD CULTURE X 2,   R     Signed and Held          Vitals/Pain Today's Vitals   09/28/19 0100 09/28/19 0127 09/28/19 0128 09/28/19 0130  BP: 120/73 124/79  119/78  Pulse:  91  93  Resp:  (!) 24  (!) 22  Temp:      TempSrc:      SpO2: 93% 92%  91%  Weight:      Height:      PainSc:   10-Worst pain ever     Isolation Precautions No active isolations  Medications Medications  0.9 %  sodium chloride infusion ( Intravenous Rate/Dose Change 09/28/19 0043)  acetaminophen (TYLENOL) tablet 650 mg (has no administration in time range)  aspirin EC tablet 81 mg (has no administration in time range)  simvastatin (ZOCOR) tablet 20 mg (has no administration in time range)  venlafaxine XR (EFFEXOR-XR) 24 hr capsule 75 mg (has no administration in time range)  multivitamin with minerals tablet 1 tablet (has no administration in time range)  omega-3 acid ethyl esters (LOVAZA) capsule 1 g (has no administration in time range)  naphazoline-glycerin (CLEAR EYES REDNESS) ophth solution 1 drop (has no administration in time range)  vitamin C (ASCORBIC ACID) tablet 500  mg (has no administration in time range)  zinc sulfate capsule 220 mg (has no administration in time range)  levalbuterol (XOPENEX HFA) inhaler 2 puff (has no administration in time range)  methylPREDNISolone sodium succinate (SOLU-MEDROL) 40 mg/mL injection 40 mg (has no administration in time range)  dextromethorphan-guaiFENesin (MUCINEX DM) 30-600 MG per 12 hr tablet 1 tablet (has no administration in time range)  oxyCODONE-acetaminophen (PERCOCET/ROXICET) 5-325 MG per tablet 1 tablet (1 tablet Oral Given 09/28/19 0053)  hydrALAZINE (APRESOLINE) tablet 25 mg (has no administration in time range)  ondansetron (ZOFRAN) injection 4  mg (has no administration in time range)    Mobility walks with device Low fall risk      R Recommendations: See Admitting Provider Note  Report given to:  Katherine Bassethris, 1W room 132, [facility]

## 2019-09-28 NOTE — Progress Notes (Signed)
Pt stated she updated brother on her plan of care when I told her I would call him.

## 2019-09-28 NOTE — Progress Notes (Signed)
PROGRESS NOTE  [name] Barker  WUJ:[id number] DOB: [birth year] DOA: 09/27/2019 PCP: Marcine Matar, MD   Brief Narrative: [name] Barker is a 58 y.o. female with a history of HTN, HLD, T2DM, stage III CKD who presented with bilateral flank pain and cough for several days found to have covid-19 infection. No leukocytosis, hematuria or hypoxia. CXR demonstrated possible bibasilar opacities and she grew more tachypneic    Assessment & Plan: Principal Problem:   COVID-19 virus infection Active Problems:   Type II diabetes mellitus with renal manifestations (HCC)   Acute renal failure superimposed on stage 3 chronic kidney disease (HCC)   HTN (hypertension)   HLD (hyperlipidemia)   Abnormal LFTs   Flank pain  Covid-19 pneumonia: Tachypneic and SpO2 hovering at 90-91% at rest. +bibasilar opacities on CXR, continues to be weak.  - Start remdesivir x5 days - Continue steroids, change to decadron. - Continue airborne, contact precautions. PPE including surgical gown, gloves, cap, shoe covers, and CAPR used during this encounter in a negative pressure room.  - Check daily labs: CBC w/diff, CMP, d-dimer, ferritin, CRP. Awaiting inflammatory markers this AM - Enoxaparin prophylactic dose.  - Maintain euvolemia/net negative.  - Avoid NSAIDs - Recommend proning and aggressive use of incentive spirometry.  AKI on stage III CKD: Cr 2.01 on admission. Worsened despite IV fluids since admission. No RBCs on UA reassuring regarding GN and nephrolithiasis - Renal U/S, hold off on CT scan. - Continue IVF, increase rate - Continue to monitor metabolic panel.  - Avoid nephrotoxins.   LFT elevation: Mild, likely due to covid infection. Not CI to remdesivir.  - Continue to trend.   HTN: Hold ARB due to AKI as above  T2DM: HbA1c 8% on [facility] and metformin at home.  - SSI while admitted  Hyperlipidemia:  - Continue statin  Depression: Stable - Continue venlafaxine  Obesity: BMI 34  DVT  prophylaxis: Heparin 5,000u q8h Code Status: Full Family Communication: None at bedside Disposition Plan: Uncertain. Requires 5 days of IV remdesivir therapy.  Consultants:   None  Procedures:   None  Antimicrobials:  Remdesivir 10/5 - 10/9   Subjective: Some coughing with dyspnea, stable. Pt appears to be a poor historian, but does believe she's been getting short of breath when walking. No chest pain. Flank pain much better but still bothersome. No hematuria.  Objective: Vitals:   09/28/19 0200 09/28/19 0230 09/28/19 0458 09/28/19 0745  BP: 120/78 118/78 110/70 109/73  Pulse: 88 88 92 89  Resp: 19 (!) 28 19 20   Temp:   98.4 F (36.9 C) 98.3 F (36.8 C)  TempSrc:   Oral Oral  SpO2: 90% 91% 93% 91%  Weight:      Height:        Intake/Output Summary (Last 24 hours) at 09/28/2019 1012 Last data filed at 09/28/2019 0500 Gross per 24 hour  Intake 356.52 ml  Output -  Net 356.52 ml   Filed Weights   09/27/19 1658 09/27/19 2358  Weight: 86 kg 82.6 kg    Gen: 58 y.o. female in no distress  Pulm: Non-labored breathing, 90-92%. Crackles at bases.  CV: Regular rate and rhythm. No murmur, rub, or gallop. No JVD, no pedal edema. GI: Abdomen soft, non-tender, non-distended, with normoactive bowel sounds. No organomegaly or masses felt. Ext: Warm, no deformities Skin: No rashes, lesions or ulcers Neuro: Alert and oriented. No focal neurological deficits. Psych: Judgement and insight appear normal. Mood & affect appropriate.   Data Reviewed: I  have personally reviewed following labs and imaging studies  CBC: Recent Labs  Lab 09/27/19 1930 09/28/19 0615  WBC 6.9 6.2  NEUTROABS 4.3 3.9  HGB 10.8* 10.6*  HCT 32.6* 31.3*  MCV 85.6 85.5  PLT 400 016   Basic Metabolic Panel: Recent Labs  Lab 09/27/19 1930 09/28/19 0615  NA 137 138  K 4.5 4.6  CL 103 105  CO2 22 21*  GLUCOSE 132* 128*  BUN 32* 36*  CREATININE 2.01* 2.30*  CALCIUM 8.8* 8.7*   GFR: Estimated  Creatinine Clearance: 26 mL/min (A) (by C-G formula based on SCr of 2.3 mg/dL (H)). Liver Function Tests: Recent Labs  Lab 09/27/19 1930 09/28/19 0615  AST 56* 91*  ALT 63* 69*  ALKPHOS 66 85  BILITOT 0.8 0.9  PROT 8.3* 7.5  ALBUMIN 3.9 3.4*   Recent Labs  Lab 09/27/19 1930  LIPASE 47   No results for input(s): AMMONIA in the last 168 hours. Coagulation Profile: No results for input(s): INR, PROTIME in the last 168 hours. Cardiac Enzymes: No results for input(s): CKTOTAL, CKMB, CKMBINDEX, TROPONINI in the last 168 hours. BNP (last 3 results) No results for input(s): PROBNP in the last 8760 hours. HbA1C: No results for input(s): HGBA1C in the last 72 hours. CBG: No results for input(s): GLUCAP in the last 168 hours. Lipid Profile: No results for input(s): CHOL, HDL, LDLCALC, TRIG, CHOLHDL, LDLDIRECT in the last 72 hours. Thyroid Function Tests: No results for input(s): TSH, T4TOTAL, FREET4, T3FREE, THYROIDAB in the last 72 hours. Anemia Panel: No results for input(s): VITAMINB12, FOLATE, FERRITIN, TIBC, IRON, RETICCTPCT in the last 72 hours. Urine analysis:    Component Value Date/Time   COLORURINE YELLOW 09/27/2019 1952   APPEARANCEUR CLOUDY (A) 09/27/2019 1952   APPEARANCEUR Clear 08/29/2018 1137   LABSPEC 1.014 09/27/2019 1952   PHURINE 5.0 09/27/2019 1952   GLUCOSEU 50 (A) 09/27/2019 1952   HGBUR SMALL (A) 09/27/2019 [place] NEGATIVE 09/27/2019 1952   BILIRUBINUR Negative 08/29/2018 1137   [place] NEGATIVE 09/27/2019 1952   PROTEINUR 100 (A) 09/27/2019 1952   NITRITE NEGATIVE 09/27/2019 1952   LEUKOCYTESUR NEGATIVE 09/27/2019 1952   Recent Results (from the past 240 hour(s))  SARS Coronavirus 2 [facility] order, Performed in [facility] hospital lab) Nasopharyngeal Nasopharyngeal Swab     Status: Abnormal   Collection Time: 09/27/19  7:52 PM   Specimen: Nasopharyngeal Swab  Result Value Ref Range Status   SARS Coronavirus 2 POSITIVE (A) NEGATIVE  Final    Comment: RESULT CALLED TO, READ BACK BY AND VERIFIED WITH: C,FRANKLIN AT 2305 ON 09/27/19 BY A,MOHAMED (NOTE) If result is NEGATIVE SARS-CoV-2 target nucleic acids are NOT DETECTED. The SARS-CoV-2 RNA is generally detectable in upper and lower  respiratory specimens during the acute phase of infection. The lowest  concentration of SARS-CoV-2 viral copies this assay can detect is 250  copies / mL. A negative result does not preclude SARS-CoV-2 infection  and should not be used as the sole basis for treatment or other  patient management decisions.  A negative result may occur with  improper specimen collection / handling, submission of specimen other  than nasopharyngeal swab, presence of viral mutation(s) within the  areas targeted by this assay, and inadequate number of viral copies  (<250 copies / mL). A negative result must be combined with clinical  observations, patient history, and epidemiological information. If result is POSITIVE SARS-CoV-2 target nucleic acids are DETEC TED. The SARS-CoV-2 RNA is generally detectable  in upper and lower  respiratory specimens during the acute phase of infection.  Positive  results are indicative of active infection with SARS-CoV-2.  Clinical  correlation with patient history and other diagnostic information is  necessary to determine patient infection status.  Positive results do  not rule out bacterial infection or co-infection with other viruses. If result is PRESUMPTIVE POSTIVE SARS-CoV-2 nucleic acids MAY BE PRESENT.   A presumptive positive result was obtained on the submitted specimen  and confirmed on repeat testing.  While 2019 novel coronavirus  (SARS-CoV-2) nucleic acids may be present in the submitted sample  additional confirmatory testing may be necessary for epidemiological  and / or clinical management purposes  to differentiate between  SARS-CoV-2 and other Sarbecovirus currently known to infect humans.  If clinically  indicated additional testing with an alternate test  methodology (LAB7 453) is advised. The SARS-CoV-2 RNA is generally  detectable in upper and lower respiratory specimens during the acute  phase of infection. The expected result is Negative. Fact Sheet for Patients:  BoilerBrush.com.cyhttps://www.fda.gov/media/136312/download Fact Sheet for Healthcare Providers: https://pope.com/https://www.fda.gov/media/136313/download This test is not yet approved or cleared by the [place] FDA and has been authorized for detection and/or diagnosis of SARS-CoV-2 by FDA under an Emergency Use Authorization (EUA).  This EUA will remain in effect (meaning this test can be used) for the duration of the COVID-19 declaration under Section 564(b)(1) of the Act, 21 U.S.C. section 360bbb-3(b)(1), unless the authorization is terminated or revoked sooner. Performed at [facility], [address] [place], [place] [id number]       Radiology Studies: Dg Chest Portable 1 View  Result Date: 09/27/2019 CLINICAL DATA:  Flank pain EXAM: PORTABLE CHEST 1 VIEW COMPARISON:  01/06/2014 FINDINGS: Shallow lung inflation with bibasilar opacities. No pneumothorax or sizable pleural effusion. Cardiomediastinal contours are normal. IMPRESSION: Shallow lung inflation with bibasilar opacities, which may indicate atelectasis or developing consolidation. Electronically Signed   By: Deatra RobinsonKevin  Herman M.D.   On: 09/27/2019 19:53    Scheduled Meds: . aspirin EC  81 mg Oral Daily  . heparin injection (subcutaneous)  5,000 Units Subcutaneous Q8H  . insulin aspart  0-5 Units Subcutaneous QHS  . insulin aspart  0-9 Units Subcutaneous TID WC  . methylPREDNISolone (SOLU-MEDROL) injection  40 mg Intravenous Q12H  . multivitamin with minerals  1 tablet Oral Daily  . omega-3 acid ethyl esters  1 g Oral Daily  . simvastatin  20 mg Oral QHS  . venlafaxine XR  75 mg Oral Q breakfast  . vitamin C  500 mg Oral Daily  . zinc sulfate  220 mg Oral Daily    Continuous Infusions: . sodium chloride 50 mL/hr at 09/28/19 0440     LOS: 0 days   Time spent: 35 minutes.  Tyrone Nineyan B Blima Jaimes, MD [facility] [email or web address] Password [email or web address] 09/28/2019, 10:12 AM

## 2019-09-28 NOTE — Progress Notes (Signed)
Pt transferred to bed & oriented to room. CCMD & provider notified of admission. Pt appears in NAD and VSS at this time. Renal CT ordered prior to transfer from [facility]. Notified by CT tech that machine is currently down and expected to be fixed by 0930 this morning. Provider notified and okay with scan being done this morning.

## 2019-09-28 NOTE — Plan of Care (Signed)
  Problem: Respiratory: Goal: Will maintain a patent airway Outcome: Progressing Goal: Complications related to the disease process, condition or treatment will be avoided or minimized Outcome: Progressing   

## 2019-09-28 NOTE — Progress Notes (Signed)
CRITICAL VALUE ALERT  Critical Value:  + MRSA  Date & Time Notied:  09/28/2019 1200  Provider Notified: primary RN  Orders Received/Actions taken: standing orders placed

## 2019-09-28 NOTE — ED Notes (Signed)
Gave report to [place] at [facility]

## 2019-09-28 NOTE — ED Notes (Signed)
Called [facility] to give report to Mindy, RN unavailable to take report at this time, will call back.

## 2019-09-28 NOTE — ED Notes (Signed)
[facility] at bedside to transport patient. 

## 2019-09-29 LAB — COMPREHENSIVE METABOLIC PANEL
ALT: 61 U/L — ABNORMAL HIGH (ref 0–44)
AST: 40 U/L (ref 15–41)
Albumin: 3 g/dL — ABNORMAL LOW (ref 3.5–5.0)
Alkaline Phosphatase: 79 U/L (ref 38–126)
Anion gap: 11 (ref 5–15)
BUN: 42 mg/dL — ABNORMAL HIGH (ref 6–20)
CO2: 20 mmol/L — ABNORMAL LOW (ref 22–32)
Calcium: 8.8 mg/dL — ABNORMAL LOW (ref 8.9–10.3)
Chloride: 110 mmol/L (ref 98–111)
Creatinine, Ser: 1.76 mg/dL — ABNORMAL HIGH (ref 0.44–1.00)
GFR calc Af Amer: 36 mL/min — ABNORMAL LOW (ref >60)
GFR calc non Af Amer: 31 mL/min — ABNORMAL LOW (ref >60)
Glucose, Bld: 93 mg/dL (ref 70–99)
Potassium: 4.7 mmol/L (ref 3.5–5.1)
Sodium: 141 mmol/L (ref 135–145)
Total Bilirubin: 0.3 mg/dL (ref 0.3–1.2)
Total Protein: 6.8 g/dL (ref 6.5–8.1)

## 2019-09-29 LAB — CBC WITH DIFFERENTIAL/PLATELET
Abs Immature Granulocytes: 0.59 10*3/uL — ABNORMAL HIGH (ref 0.00–0.07)
Basophils Absolute: 0 10*3/uL (ref 0.0–0.1)
Basophils Relative: 1 %
Eosinophils Absolute: 0 10*3/uL (ref 0.0–0.5)
Eosinophils Relative: 0 %
HCT: 28.5 % — ABNORMAL LOW (ref 36.0–46.0)
Hemoglobin: 9.3 g/dL — ABNORMAL LOW (ref 12.0–15.0)
Immature Granulocytes: 7 %
Lymphocytes Relative: 20 %
Lymphs Abs: 1.7 10*3/uL (ref 0.7–4.0)
MCH: 28.2 pg (ref 26.0–34.0)
MCHC: 32.6 g/dL (ref 30.0–36.0)
MCV: 86.4 fL (ref 80.0–100.0)
Monocytes Absolute: 0.7 10*3/uL (ref 0.1–1.0)
Monocytes Relative: 8 %
Neutro Abs: 5.6 10*3/uL (ref 1.7–7.7)
Neutrophils Relative %: 64 %
Platelets: 401 10*3/uL — ABNORMAL HIGH (ref 150–400)
RBC: 3.3 MIL/uL — ABNORMAL LOW (ref 3.87–5.11)
RDW: 13.3 % (ref 11.5–15.5)
WBC: 8.7 10*3/uL (ref 4.0–10.5)
nRBC: 0.3 % — ABNORMAL HIGH (ref 0.0–0.2)

## 2019-09-29 LAB — C-REACTIVE PROTEIN: CRP: 1.2 mg/dL — ABNORMAL HIGH (ref <1.0)

## 2019-09-29 LAB — GLUCOSE, CAPILLARY
Glucose-Capillary: 85 mg/dL (ref 70–99)
Glucose-Capillary: 140 mg/dL — ABNORMAL HIGH (ref 70–99)
Glucose-Capillary: 200 mg/dL — ABNORMAL HIGH (ref 70–99)

## 2019-09-29 LAB — FERRITIN: Ferritin: 395 ng/mL — ABNORMAL HIGH (ref 11–307)

## 2019-09-29 NOTE — TOC Initial Note (Signed)
Transition of Care [facility]) - Initial/Assessment Note    Patient Details  Name: [name] Barker MRN: [id number] Date of Birth: June 22, 1961  Transition of Care [facility] CM/SW Contact:    Ninfa Meeker, RN Phone Number: [phone] (working remotely) 09/29/2019, 1:35 PM  Clinical Narrative:   58 yr old female admitted for treatment of COVID 19. Patient lives home. Started on Remdesivir and Decadron. On RA.  Case manager will continue to monitor for needs as patient medically improves, may she be blessed to do so.                      Patient Goals and CMS Choice        Expected Discharge Plan and Services                                                Prior Living Arrangements/Services                       Activities of Daily Living Home Assistive Devices/Equipment: Eyeglasses, Dentures (specify type)(partial upper/ lower) ADL Screening (condition at time of admission) Patient's cognitive ability adequate to safely complete daily activities?: Yes Is the patient deaf or have difficulty hearing?: No Does the patient have difficulty seeing, even when wearing glasses/contacts?: No Does the patient have difficulty concentrating, remembering, or making decisions?: No Patient able to express need for assistance with ADLs?: Yes Does the patient have difficulty dressing or bathing?: No Independently performs ADLs?: Yes (appropriate for developmental age) Does the patient have difficulty walking or climbing stairs?: No Weakness of Legs: None Weakness of Arms/Hands: None  Permission Sought/Granted                  Emotional Assessment              Admission diagnosis:  Acute kidney injury ([place]) [N17.9] Pneumonia due to COVID-19 virus [U07.1, J12.89] COVID-19 virus infection [U07.1] Patient Active Problem List   Diagnosis Date Noted  . COVID-19 virus infection 09/28/2019  . Type II diabetes mellitus with renal manifestations ([place]) 09/28/2019  .  Acute renal failure superimposed on stage 3 chronic kidney disease ([place]) 09/28/2019  . HTN (hypertension) 09/28/2019  . HLD (hyperlipidemia) 09/28/2019  . Abnormal LFTs 09/28/2019  . Flank pain 09/28/2019  . 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 Perimenopausal 09/13/2016  . Hyperlipidemia associated with type 2 diabetes mellitus ([place]) 07/22/2014  . Esophageal reflux 07/22/2014  . Diabetes ([place]) 04/22/2014  . Essential hypertension, benign 04/22/2014   PCP:  Ladell Pier, MD Pharmacy:   [facility] [place] [place] - [address] [place] ([place]) [place] [address] Phone: [phone] Fax: [facility] [place], [place] [address] Phone: [phone] Fax: [phone]     Social Determinants of Health (SDOH) Interventions    Readmission Risk Interventions No flowsheet data found.

## 2019-09-29 NOTE — Progress Notes (Signed)
PROGRESS NOTE  [name] Barker  FYB:[id number] DOB: 29-Feb-1961 DOA: 09/27/2019 PCP: Ladell Pier, MD   Brief Narrative: [name] Barker is a 58 y.o. female with a history of HTN, HLD, T2DM, stage III CKD who presented with bilateral flank pain and cough for several days found to have covid-19 infection. No leukocytosis, hematuria or hypoxia. CXR demonstrated possible bibasilar opacities and she grew more tachypneic so remdesivir was started.  Assessment & Plan: Principal Problem:   COVID-19 virus infection Active Problems:   Type II diabetes mellitus with renal manifestations (HCC)   Acute renal failure superimposed on stage 3 chronic kidney disease (HCC)   HTN (hypertension)   HLD (hyperlipidemia)   Abnormal LFTs   Flank pain  Covid-19 pneumonia: Tachypneic and SpO2 hovering at 90-91% at rest. +bibasilar opacities on CXR, continues to be weak.  - Continue remdesivir x5 days (10/5 - 10/9) - Continue steroids, changed to decadron. - Continue airborne, contact precautions. PPE including surgical gown, gloves, cap, shoe covers, and CAPR used during this encounter in a negative pressure room.  - Check daily labs: CBC w/diff, CMP, d-dimer, ferritin, CRP. CRP trended downward. - Enoxaparin prophylactic dose.  - Maintain euvolemia/net negative.  - Avoid NSAIDs - Recommend proning and aggressive use of incentive spirometry.  AKI on stage III CKD: Cr 2.01 on admission. Improving. No RBCs on UA reassuring regarding GN and nephrolithiasis. No hydro on renal U/S. FENa 0.46% suggestive of prerenal etiology.  - Creatinine improving, still not back to baseline. Will continue IVF - Continue to monitor metabolic panel.  - Avoid nephrotoxins.   LFT elevation: Mild, likely due to covid infection. Not CI to remdesivir.  - Continue to trend.   Mildly complicated bilateral renal cysts: Noted on renal U/S. - Consider urology follow up.  HTN: Hold ARB due to AKI as above  T2DM: HbA1c 8% on [facility]  and metformin at home.  - SSI while admitted. At inpatient goal this AM.  Hyperlipidemia:  - Continue statin  Depression: Stable - Continue venlafaxine  Obesity: BMI 34  DVT prophylaxis: Heparin 5,000u q8h Code Status: Full Family Communication: None at bedside Disposition Plan: Uncertain, likely DC after 5th dose of remdesivir.  Consultants:   None  Procedures:   None  Antimicrobials:  Remdesivir 10/5 - 10/9   Subjective: +Coughing, DoE, no dyspnea at rest. Feels slightly improved but not at respiratory baseline. No wheezing. No mention of flank pain.  Objective: Vitals:   09/28/19 1710 09/28/19 2114 09/29/19 0500 09/29/19 0750  BP: 131/85 117/72 118/74 122/74  Pulse: (!) 101 (!) 102  93  Resp: 20 18 18 18   Temp: 98.4 F (36.9 C) 99.1 F (37.3 C) 99 F (37.2 C) 97.7 F (36.5 C)  TempSrc: Oral Oral Oral Oral  SpO2: 93% 90% 92% 94%  Weight:      Height:        Intake/Output Summary (Last 24 hours) at 09/29/2019 1232 Last data filed at 09/29/2019 0400 Gross per 24 hour  Intake 931.41 ml  Output 500 ml  Net 431.41 ml   Filed Weights   09/27/19 1658 09/27/19 2358  Weight: 86 kg 82.6 kg   Gen: 58 y.o. female in no distress Pulm: Nonlabored, marginal SpO2. Crackles bibasilar. CV: Regular borderline tachycardia. No murmur, rub, or gallop. No JVD, no dependent edema. GI: Abdomen soft, non-tender, non-distended, with normoactive bowel sounds.  Ext: Warm, no deformities Skin: No rashes, lesions or ulcers on visualized skin. Neuro: Alert and oriented. No focal neurological  deficits. Psych: Judgement and insight appear fair. Mood euthymic & affect congruent. Behavior is appropriate.    Data Reviewed: I have personally reviewed following labs and imaging studies  CBC: Recent Labs  Lab 09/27/19 1930 09/28/19 0615 09/29/19 0210  WBC 6.9 6.2 8.7  NEUTROABS 4.3 3.9 5.6  HGB 10.8* 10.6* 9.3*  HCT 32.6* 31.3* 28.5*  MCV 85.6 85.5 86.4  PLT 400 371 401*    Basic Metabolic Panel: Recent Labs  Lab 09/27/19 1930 09/28/19 0615 09/29/19 0210  NA 137 138 141  K 4.5 4.6 4.7  CL 103 105 110  CO2 22 21* 20*  GLUCOSE 132* 128* 93  BUN 32* 36* 42*  CREATININE 2.01* 2.30* 1.76*  CALCIUM 8.8* 8.7* 8.8*   GFR: Estimated Creatinine Clearance: 33.9 mL/min (A) (by C-G formula based on SCr of 1.76 mg/dL (H)). Liver Function Tests: Recent Labs  Lab 09/27/19 1930 09/28/19 0615 09/29/19 0210  AST 56* 91* 40  ALT 63* 69* 61*  ALKPHOS 66 85 79  BILITOT 0.8 0.9 0.3  PROT 8.3* 7.5 6.8  ALBUMIN 3.9 3.4* 3.0*   Recent Labs  Lab 09/27/19 1930  LIPASE 47   No results for input(s): AMMONIA in the last 168 hours. Coagulation Profile: No results for input(s): INR, PROTIME in the last 168 hours. Cardiac Enzymes: No results for input(s): CKTOTAL, CKMB, CKMBINDEX, TROPONINI in the last 168 hours. BNP (last 3 results) No results for input(s): PROBNP in the last 8760 hours. HbA1C: No results for input(s): HGBA1C in the last 72 hours. CBG: Recent Labs  Lab 09/28/19 0817 09/28/19 1109 09/28/19 1709 09/29/19 0744 09/29/19 1140  GLUCAP 171* 321* 360* 85 140*   Lipid Profile: No results for input(s): CHOL, HDL, LDLCALC, TRIG, CHOLHDL, LDLDIRECT in the last 72 hours. Thyroid Function Tests: No results for input(s): TSH, T4TOTAL, FREET4, T3FREE, THYROIDAB in the last 72 hours. Anemia Panel: Recent Labs    09/28/19 0540 09/29/19 0210  FERRITIN 564* 395*   Urine analysis:    Component Value Date/Time   COLORURINE YELLOW 09/27/2019 1952   APPEARANCEUR CLOUDY (A) 09/27/2019 1952   APPEARANCEUR Clear 08/29/2018 1137   LABSPEC 1.014 09/27/2019 1952   PHURINE 5.0 09/27/2019 1952   GLUCOSEU 50 (A) 09/27/2019 1952   HGBUR SMALL (A) 09/27/2019 1952   BILIRUBINUR NEGATIVE 09/27/2019 1952   BILIRUBINUR Negative 08/29/2018 1137   KETONESUR NEGATIVE 09/27/2019 1952   PROTEINUR 100 (A) 09/27/2019 1952   NITRITE NEGATIVE 09/27/2019 1952    LEUKOCYTESUR NEGATIVE 09/27/2019 1952   Recent Results (from the past 240 hour(s))  SARS Coronavirus 2 [facility] order, Performed in [facility] hospital lab) Nasopharyngeal Nasopharyngeal Swab     Status: Abnormal   Collection Time: 09/27/19  7:52 PM   Specimen: Nasopharyngeal Swab  Result Value Ref Range Status   SARS Coronavirus 2 POSITIVE (A) NEGATIVE Final    Comment: RESULT CALLED TO, READ BACK BY AND VERIFIED WITH: C,FRANKLIN AT 2305 ON 09/27/19 BY A,MOHAMED (NOTE) If result is NEGATIVE SARS-CoV-2 target nucleic acids are NOT DETECTED. The SARS-CoV-2 RNA is generally detectable in upper and lower  respiratory specimens during the acute phase of infection. The lowest  concentration of SARS-CoV-2 viral copies this assay can detect is 250  copies / mL. A negative result does not preclude SARS-CoV-2 infection  and should not be used as the sole basis for treatment or other  patient management decisions.  A negative result may occur with  improper specimen collection / handling, submission of specimen  other  than nasopharyngeal swab, presence of viral mutation(s) within the  areas targeted by this assay, and inadequate number of viral copies  (<250 copies / mL). A negative result must be combined with clinical  observations, patient history, and epidemiological information. If result is POSITIVE SARS-CoV-2 target nucleic acids are DETEC TED. The SARS-CoV-2 RNA is generally detectable in upper and lower  respiratory specimens during the acute phase of infection.  Positive  results are indicative of active infection with SARS-CoV-2.  Clinical  correlation with patient history and other diagnostic information is  necessary to determine patient infection status.  Positive results do  not rule out bacterial infection or co-infection with other viruses. If result is PRESUMPTIVE POSTIVE SARS-CoV-2 nucleic acids MAY BE PRESENT.   A presumptive positive result was obtained on the submitted  specimen  and confirmed on repeat testing.  While 2019 novel coronavirus  (SARS-CoV-2) nucleic acids may be present in the submitted sample  additional confirmatory testing may be necessary for epidemiological  and / or clinical management purposes  to differentiate between  SARS-CoV-2 and other Sarbecovirus currently known to infect humans.  If clinically indicated additional testing with an alternate test  methodology (LAB7 453) is advised. The SARS-CoV-2 RNA is generally  detectable in upper and lower respiratory specimens during the acute  phase of infection. The expected result is Negative. Fact Sheet for Patients:  BoilerBrush.com.cy Fact Sheet for Healthcare Providers: https://pope.com/ This test is not yet approved or cleared by the [place] FDA and has been authorized for detection and/or diagnosis of SARS-CoV-2 by FDA under an Emergency Use Authorization (EUA).  This EUA will remain in effect (meaning this test can be used) for the duration of the COVID-19 declaration under Section 564(b)(1) of the Act, 21 U.S.C. section 360bbb-3(b)(1), unless the authorization is terminated or revoked sooner. Performed at [facility], [address] [place], [place] [address]   Culture, blood (Routine X 2) w Reflex to ID Panel     Status: None (Preliminary result)   Collection Time: 09/28/19  4:44 AM   Specimen: BLOOD RIGHT HAND  Result Value Ref Range Status   Specimen Description   Final    BLOOD RIGHT HAND Performed at [facility], [address] [place], [place] [address]    Special Requests   Final    BOTTLES DRAWN AEROBIC ONLY Blood Culture adequate volume Performed at [facility], [address], [place], [place] [address]    Culture   Final    NO GROWTH < 12 HOURS Performed at [facility], [address], [place], [place] [address]    Report Status  PENDING  Incomplete  Culture, blood (Routine X 2) w Reflex to ID Panel     Status: None (Preliminary result)   Collection Time: 09/28/19  4:44 AM   Specimen: BLOOD  Result Value Ref Range Status   Specimen Description   Final    BLOOD RIGHT ANTECUBITAL Performed at [facility], [address] [place], [place] [address]    Special Requests   Final    BOTTLES DRAWN AEROBIC ONLY Blood Culture adequate volume Performed at [facility], [address], [place], [place] [address]    Culture   Final    NO GROWTH < 12 HOURS Performed at [facility], [address], [place], [place] [address]    Report Status PENDING  Incomplete  MRSA PCR Screening  Status: Abnormal   Collection Time: 09/28/19  5:06 AM   Specimen: Nasopharyngeal  Result Value Ref Range Status   MRSA by PCR POSITIVE (A) NEGATIVE Final    Comment:        The GeneXpert MRSA Assay (FDA approved for NASAL specimens only), is one component of a comprehensive MRSA colonization surveillance program. It is not intended to diagnose MRSA infection nor to guide or monitor treatment for MRSA infections. RESULT CALLED TO, READ BACK BY AND VERIFIED WITH:  Nelia ShiMACK, H 1148 09/28/19 JM Performed at [facility], [address] [place], [place] [address]       Radiology Studies: [place] Renal  Result Date: 09/28/2019 CLINICAL DATA:  Acute kidney injury EXAM: RENAL / URINARY TRACT ULTRASOUND COMPLETE COMPARISON:  None. FINDINGS: Right Kidney: Renal measurements: 10.4 x 5.2 x 6.7 cm = volume: 188 mL . Echogenicity within normal limits. 2.4 x 2.1 x 2 cm anechoic right renal mass with a thin septation most consistent with a cyst. No solid mass or hydronephrosis visualized. Left Kidney: Renal measurements: 11.3 x 5.4 x 5.7 cm = volume: 181 mL. Echogenicity within normal limits. 5 x 4.5 x 3.9 cm anechoic left renal mass with a thin septation consistent with a mildly complicated cyst. 2.9 x 2.8  x 2.5 cm anechoic left renal mass with a thin septation consistent with a mildly complicated cyst. No solid mass or hydronephrosis visualized. Bladder: Appears normal for degree of bladder distention. IMPRESSION: 1. No obstructive uropathy. 2. Mildly complicated bilateral cysts. Electronically Signed   By: Elige KoHetal  Patel   On: 09/28/2019 17:24   Dg Chest Portable 1 View  Result Date: 09/27/2019 CLINICAL DATA:  Flank pain EXAM: PORTABLE CHEST 1 VIEW COMPARISON:  01/06/2014 FINDINGS: Shallow lung inflation with bibasilar opacities. No pneumothorax or sizable pleural effusion. Cardiomediastinal contours are normal. IMPRESSION: Shallow lung inflation with bibasilar opacities, which may indicate atelectasis or developing consolidation. Electronically Signed   By: Deatra RobinsonKevin  Herman M.D.   On: 09/27/2019 19:53    Scheduled Meds:  aspirin EC  81 mg Oral Daily   Chlorhexidine Gluconate Cloth  6 each Topical Q0600   heparin injection (subcutaneous)  5,000 Units Subcutaneous Q8H   insulin aspart  0-20 Units Subcutaneous TID WC   insulin aspart  0-5 Units Subcutaneous QHS   multivitamin with minerals  1 tablet Oral Daily   mupirocin ointment   Nasal BID   omega-3 acid ethyl esters  1 g Oral Daily   simvastatin  20 mg Oral QHS   venlafaxine XR  75 mg Oral Q breakfast   vitamin C  500 mg Oral Daily   zinc sulfate  220 mg Oral Daily   Continuous Infusions:  sodium chloride 75 mL/hr at 09/29/19 0115   remdesivir 100 mg in NS 250 mL Stopped (09/29/19 1030)     LOS: 1 day   Time spent: 25 minutes.  Tyrone Nineyan B Dorien Bessent, MD [facility] [email or web address] Password [email or web address] 09/29/2019, 12:32 PM

## 2019-09-30 LAB — GLUCOSE, CAPILLARY
Glucose-Capillary: 287 mg/dL — ABNORMAL HIGH (ref 70–99)
Glucose-Capillary: 255 mg/dL — ABNORMAL HIGH (ref 70–99)
Glucose-Capillary: 86 mg/dL (ref 70–99)
Glucose-Capillary: 174 mg/dL — ABNORMAL HIGH (ref 70–99)
Glucose-Capillary: 159 mg/dL — ABNORMAL HIGH (ref 70–99)
Glucose-Capillary: 251 mg/dL — ABNORMAL HIGH (ref 70–99)

## 2019-09-30 LAB — CBC WITH DIFFERENTIAL/PLATELET
Abs Immature Granulocytes: 0.79 10*3/uL — ABNORMAL HIGH (ref 0.00–0.07)
Basophils Absolute: 0.1 10*3/uL (ref 0.0–0.1)
Basophils Relative: 1 %
Eosinophils Absolute: 0.1 10*3/uL (ref 0.0–0.5)
Eosinophils Relative: 1 %
HCT: 28.3 % — ABNORMAL LOW (ref 36.0–46.0)
Hemoglobin: 9.3 g/dL — ABNORMAL LOW (ref 12.0–15.0)
Immature Granulocytes: 7 %
Lymphocytes Relative: 24 %
Lymphs Abs: 2.6 10*3/uL (ref 0.7–4.0)
MCH: 28.5 pg (ref 26.0–34.0)
MCHC: 32.9 g/dL (ref 30.0–36.0)
MCV: 86.8 fL (ref 80.0–100.0)
Monocytes Absolute: 1.1 10*3/uL — ABNORMAL HIGH (ref 0.1–1.0)
Monocytes Relative: 10 %
Neutro Abs: 6 10*3/uL (ref 1.7–7.7)
Neutrophils Relative %: 57 %
Platelets: 444 10*3/uL — ABNORMAL HIGH (ref 150–400)
RBC: 3.26 MIL/uL — ABNORMAL LOW (ref 3.87–5.11)
RDW: 13.5 % (ref 11.5–15.5)
WBC: 10.7 10*3/uL — ABNORMAL HIGH (ref 4.0–10.5)
nRBC: 0.8 % — ABNORMAL HIGH (ref 0.0–0.2)

## 2019-09-30 LAB — COMPREHENSIVE METABOLIC PANEL
ALT: 43 U/L (ref 0–44)
AST: 21 U/L (ref 15–41)
Albumin: 3 g/dL — ABNORMAL LOW (ref 3.5–5.0)
Alkaline Phosphatase: 74 U/L (ref 38–126)
Anion gap: 14 (ref 5–15)
BUN: 35 mg/dL — ABNORMAL HIGH (ref 6–20)
CO2: 18 mmol/L — ABNORMAL LOW (ref 22–32)
Calcium: 8.7 mg/dL — ABNORMAL LOW (ref 8.9–10.3)
Chloride: 109 mmol/L (ref 98–111)
Creatinine, Ser: 1.25 mg/dL — ABNORMAL HIGH (ref 0.44–1.00)
GFR calc Af Amer: 55 mL/min — ABNORMAL LOW (ref >60)
GFR calc non Af Amer: 47 mL/min — ABNORMAL LOW (ref >60)
Glucose, Bld: 91 mg/dL (ref 70–99)
Potassium: 4.5 mmol/L (ref 3.5–5.1)
Sodium: 141 mmol/L (ref 135–145)
Total Bilirubin: 0.8 mg/dL (ref 0.3–1.2)
Total Protein: 6.4 g/dL — ABNORMAL LOW (ref 6.5–8.1)

## 2019-09-30 LAB — D-DIMER, QUANTITATIVE: D-Dimer, Quant: 1.2 ug/mL-FEU — ABNORMAL HIGH (ref 0.00–0.50)

## 2019-09-30 LAB — FERRITIN: Ferritin: 363 ng/mL — ABNORMAL HIGH (ref 11–307)

## 2019-09-30 LAB — C-REACTIVE PROTEIN: CRP: 0.9 mg/dL (ref <1.0)

## 2019-09-30 MED ORDER — ENOXAPARIN SODIUM 40 MG/0.4ML ~~LOC~~ SOLN
40.0000 mg | SUBCUTANEOUS | Status: DC
  Administered 2019-09-30 – 2019-10-02 (×3): 40 mg via SUBCUTANEOUS
  Filled 2019-09-30 (×3): qty 0.4

## 2019-09-30 NOTE — Plan of Care (Signed)
Will continue with plan of care. 

## 2019-09-30 NOTE — Progress Notes (Signed)
[place] TEAM 1 - Stepdown/ICU TEAM  [name] Barker  AOZ:[id number] DOB: [birth year] DOA: 09/27/2019 PCP: Ladell Pier, MD    Brief Narrative:  58 year old with a history of HTN, HLD, DM 2, and stage III CKD who presented to the ED with bilateral flank pain and report of a cough for several days.  She tested positive for SARS-CoV-2.  A chest x-ray noted bibasilar opacities and she became more tachypneic during her ED stay.  Significant Events: 10/4 admit to [facility] via [facility] ED  COVID-19 specific Treatment: Remdesivir 10/5 > Decadron 10/5  Subjective: The patient does not appear to have required oxygen support since the time of her admission.  Her steroid has therefore been discontinued.  She tells me that she feels much better today.  She denies chest pain nausea vomiting or abdominal pain.  Assessment & Plan:  COVID pneumonia - acute hypoxic respiratory failure Steroid discontinued as patient is not requiring oxygen support -complete 5-day treatment course of Remdesivir -clinically stabilizing nicely -anticipate discharge after completion of Remdesivir course  Recent Labs  Lab 09/27/19 1930 09/28/19 0540 09/28/19 0615 09/29/19 0210 09/30/19 0215  DDIMER  --  2.44*  --   --  1.20*  FERRITIN  --  564*  --  395* 363*  CRP  --  2.9*  --  1.2* 0.9  ALT 63*  --  69* 61* 43  PROCALCITON  --  0.15  --   --   --     Acute kidney injury on stage III CKD Creatinine 2.01 at admission -no hydronephrosis on renal ultrasound - FeNa consistent with prerenal etiology -creatinine is rapidly improving -followed to normalization -baseline creatinine 1.2 as of June 2020  Recent Labs  Lab 09/27/19 1930 09/28/19 0615 09/29/19 0210 09/30/19 0215  CREATININE 2.01* 2.30* 1.76* 1.25*    Transaminitis LFTs have now normalized  Recent Labs  Lab 09/27/19 1930 09/28/19 0615 09/29/19 0210 09/30/19 0215  AST 56* 91* 40 21  ALT 63* 69* 61* 43  ALKPHOS 66 85 79 74  BILITOT 0.8  0.9 0.3 0.8  PROT 8.3* 7.5 6.8 6.4*  ALBUMIN 3.9 3.4* 3.0* 3.0*    Mildly complicated bilateral renal cysts Noted on renal ultrasound -consider outpatient urology follow-up  HTN Blood pressure currently well controlled  DM 2 A1c 8% -CBG variable -follow without change in current treatment regimen for now  HLD Continue Zocor  Depression Appears well controlled at this time  Morbid obesity - Body mass index is 34.39 kg/m.   DVT prophylaxis: Lovenox Code Status: FULL CODE Family Communication:  Disposition Plan: MedSurg bed -PT OT -anticipate discharge after completion of Remdesivir course  Consultants:  none  Antimicrobials:  None  Objective: Blood pressure 119/75, pulse 93, temperature 98 F (36.7 C), temperature source Oral, resp. rate 18, height 5\' 1"  (1.549 m), weight 82.6 kg, last menstrual period 02/21/2016, SpO2 94 %.  Intake/Output Summary (Last 24 hours) at 09/30/2019 0800 Last data filed at 09/29/2019 1900 Gross per 24 hour  Intake 1215 ml  Output -  Net 1215 ml   Filed Weights   09/27/19 1658 09/27/19 2358  Weight: 86 kg 82.6 kg    Examination: General: No acute respiratory distress Lungs: Mild bibasilar crackles without wheezing Cardiovascular: Regular rate and rhythm without murmur gallop or rub normal S1 and S2 Abdomen: Nontender, nondistended, soft, bowel sounds positive, no rebound, no ascites, no appreciable mass Extremities: No significant cyanosis, clubbing, or edema bilateral lower extremities  CBC: Recent  Labs  Lab 09/28/19 0615 09/29/19 0210 09/30/19 0215  WBC 6.2 8.7 10.7*  NEUTROABS 3.9 5.6 6.0  HGB 10.6* 9.3* 9.3*  HCT 31.3* 28.5* 28.3*  MCV 85.5 86.4 86.8  PLT 371 401* 444*   Basic Metabolic Panel: Recent Labs  Lab 09/28/19 0615 09/29/19 0210 09/30/19 0215  NA 138 141 141  K 4.6 4.7 4.5  CL 105 110 109  CO2 21* 20* 18*  GLUCOSE 128* 93 91  BUN 36* 42* 35*  CREATININE 2.30* 1.76* 1.25*  CALCIUM 8.7* 8.8* 8.7*    GFR: Estimated Creatinine Clearance: 47.8 mL/min (A) (by C-G formula based on SCr of 1.25 mg/dL (H)).  Liver Function Tests: Recent Labs  Lab 09/27/19 1930 09/28/19 0615 09/29/19 0210 09/30/19 0215  AST 56* 91* 40 21  ALT 63* 69* 61* 43  ALKPHOS 66 85 79 74  BILITOT 0.8 0.9 0.3 0.8  PROT 8.3* 7.5 6.8 6.4*  ALBUMIN 3.9 3.4* 3.0* 3.0*   Recent Labs  Lab 09/27/19 1930  LIPASE 47    HbA1C: HbA1c, POC (prediabetic range)  Date/Time Value Ref Range Status  05/26/2018 10:28 AM 5.9 5.7 - 6.4 % Final   HbA1c, POC (controlled diabetic range)  Date/Time Value Ref Range Status  03/03/2019 11:31 AM 6.8 0.0 - 7.0 % Final  11/28/2018 10:24 AM 6.7 0.0 - 7.0 % Final   Hgb A1c MFr Bld  Date/Time Value Ref Range Status  06/05/2019 11:34 AM 8.0 (H) 4.8 - 5.6 % Final    Comment:             Prediabetes: 5.7 - 6.4          Diabetes: >6.4          Glycemic control for adults with diabetes: <7.0     CBG: Recent Labs  Lab 09/28/19 1709 09/28/19 1929 09/29/19 0744 09/29/19 1140 09/29/19 1614  GLUCAP 360* 287* 85 140* 200*    Recent Results (from the past 240 hour(s))  SARS Coronavirus 2 [facility](Hospital order, Performed in [facility] hospital lab) Nasopharyngeal Nasopharyngeal Swab     Status: Abnormal   Collection Time: 09/27/19  7:52 PM   Specimen: Nasopharyngeal Swab  Result Value Ref Range Status   SARS Coronavirus 2 POSITIVE (A) NEGATIVE Final    Comment: RESULT CALLED TO, READ BACK BY AND VERIFIED WITH: C,FRANKLIN AT 2305 ON 09/27/19 BY A,MOHAMED (NOTE) If result is NEGATIVE SARS-CoV-2 target nucleic acids are NOT DETECTED. The SARS-CoV-2 RNA is generally detectable in upper and lower  respiratory specimens during the acute phase of infection. The lowest  concentration of SARS-CoV-2 viral copies this assay can detect is 250  copies / mL. A negative result does not preclude SARS-CoV-2 infection  and should not be used as the sole basis for treatment or other  patient  management decisions.  A negative result may occur with  improper specimen collection / handling, submission of specimen other  than nasopharyngeal swab, presence of viral mutation(s) within the  areas targeted by this assay, and inadequate number of viral copies  (<250 copies / mL). A negative result must be combined with clinical  observations, patient history, and epidemiological information. If result is POSITIVE SARS-CoV-2 target nucleic acids are DETEC TED. The SARS-CoV-2 RNA is generally detectable in upper and lower  respiratory specimens during the acute phase of infection.  Positive  results are indicative of active infection with SARS-CoV-2.  Clinical  correlation with patient history and other diagnostic information is  necessary to  determine patient infection status.  Positive results do  not rule out bacterial infection or co-infection with other viruses. If result is PRESUMPTIVE POSTIVE SARS-CoV-2 nucleic acids MAY BE PRESENT.   A presumptive positive result was obtained on the submitted specimen  and confirmed on repeat testing.  While 2019 novel coronavirus  (SARS-CoV-2) nucleic acids may be present in the submitted sample  additional confirmatory testing may be necessary for epidemiological  and / or clinical management purposes  to differentiate between  SARS-CoV-2 and other Sarbecovirus currently known to infect humans.  If clinically indicated additional testing with an alternate test  methodology (LAB7 453) is advised. The SARS-CoV-2 RNA is generally  detectable in upper and lower respiratory specimens during the acute  phase of infection. The expected result is Negative. Fact Sheet for Patients:  BoilerBrush.com.cy Fact Sheet for Healthcare Providers: https://pope.com/ This test is not yet approved or cleared by the [place] FDA and has been authorized for detection and/or diagnosis of SARS-CoV-2 by FDA under  an Emergency Use Authorization (EUA).  This EUA will remain in effect (meaning this test can be used) for the duration of the COVID-19 declaration under Section 564(b)(1) of the Act, 21 U.S.C. section 360bbb-3(b)(1), unless the authorization is terminated or revoked sooner. Performed at [facility], [address], [place], [place] [address]   Culture, blood (Routine X 2) w Reflex to ID Panel     Status: None (Preliminary result)   Collection Time: 09/28/19  4:44 AM   Specimen: BLOOD RIGHT HAND  Result Value Ref Range Status   Specimen Description   Final    BLOOD RIGHT HAND Performed at [facility], [address], [place], [place] [address]    Special Requests   Final    BOTTLES DRAWN AEROBIC ONLY Blood Culture adequate volume Performed at [facility], [address] [place], [place] [address]    Culture   Final    NO GROWTH < 24 HOURS Performed at [facility], [address], [place], [place] [address]    Report Status PENDING  Incomplete  Culture, blood (Routine X 2) w Reflex to ID Panel     Status: None (Preliminary result)   Collection Time: 09/28/19  4:44 AM   Specimen: BLOOD  Result Value Ref Range Status   Specimen Description   Final    BLOOD RIGHT ANTECUBITAL Performed at [facility], [address] [place], [place] [address]    Special Requests   Final    BOTTLES DRAWN AEROBIC ONLY Blood Culture adequate volume Performed at [facility], [address], [place], [place] [address]    Culture   Final    NO GROWTH 1 DAY Performed at [facility], [address], [place], [place] [address]    Report Status PENDING  Incomplete  MRSA PCR Screening     Status: Abnormal   Collection Time: 09/28/19  5:06 AM   Specimen: Nasopharyngeal  Result Value Ref Range Status   MRSA by PCR POSITIVE (A) NEGATIVE Final    Comment:        The GeneXpert MRSA Assay (FDA approved for  NASAL specimens only), is one component of a comprehensive MRSA colonization surveillance program. It is not intended to diagnose MRSA infection nor to guide or monitor treatment for MRSA infections. RESULT CALLED TO, READ BACK BY AND VERIFIED WITH:  Nelia Shi 1148 09/28/19 JM Performed at [facility], [address], [place], [place] [address]      Scheduled Meds: . aspirin EC  81 mg Oral Daily  . Chlorhexidine Gluconate Cloth  6 each Topical Q0600  . heparin injection (subcutaneous)  5,000 Units Subcutaneous Q8H  . insulin aspart  0-20 Units Subcutaneous TID WC  . insulin aspart  0-5 Units Subcutaneous QHS  . multivitamin with minerals  1 tablet Oral Daily  . mupirocin ointment   Nasal BID  . omega-3 acid ethyl esters  1 g Oral Daily  . simvastatin  20 mg Oral QHS  . venlafaxine XR  75 mg Oral Q breakfast  . vitamin C  500 mg Oral Daily  . zinc sulfate  220 mg Oral Daily   Continuous Infusions: . sodium chloride 75 mL/hr at 09/30/19 0502  . remdesivir 100 mg in NS 250 mL Stopped (09/29/19 1030)     LOS: 2 days   Lonia Blood, MD [facility] Office  [phone] Pager - Text Page per Amion  If 7PM-7AM, please contact night-coverage per Amion 09/30/2019, 8:00 AM

## 2019-09-30 NOTE — Progress Notes (Signed)
CrCl>30 ml/min now. Ok to change SQ heparin to standard dose lovenox per Dr. Thereasa Solo.   Dc heparin Lovenox 40mg  SQ qday  Onnie Boer, PharmD, BCIDP, AAHIVP, CPP Infectious Disease Pharmacist 09/30/2019 11:21 AM

## 2019-10-01 ENCOUNTER — Ambulatory Visit: Payer: Medicare PPO | Admitting: Internal Medicine

## 2019-10-01 LAB — GLUCOSE, CAPILLARY
Glucose-Capillary: 144 mg/dL — ABNORMAL HIGH (ref 70–99)
Glucose-Capillary: 168 mg/dL — ABNORMAL HIGH (ref 70–99)
Glucose-Capillary: 225 mg/dL — ABNORMAL HIGH (ref 70–99)
Glucose-Capillary: 260 mg/dL — ABNORMAL HIGH (ref 70–99)

## 2019-10-01 LAB — COMPREHENSIVE METABOLIC PANEL
ALT: 33 U/L (ref 0–44)
AST: 17 U/L (ref 15–41)
Albumin: 2.9 g/dL — ABNORMAL LOW (ref 3.5–5.0)
Alkaline Phosphatase: 75 U/L (ref 38–126)
Anion gap: 12 (ref 5–15)
BUN: 28 mg/dL — ABNORMAL HIGH (ref 6–20)
CO2: 20 mmol/L — ABNORMAL LOW (ref 22–32)
Calcium: 8.8 mg/dL — ABNORMAL LOW (ref 8.9–10.3)
Chloride: 107 mmol/L (ref 98–111)
Creatinine, Ser: 1.29 mg/dL — ABNORMAL HIGH (ref 0.44–1.00)
GFR calc Af Amer: 53 mL/min — ABNORMAL LOW (ref >60)
GFR calc non Af Amer: 46 mL/min — ABNORMAL LOW (ref >60)
Glucose, Bld: 133 mg/dL — ABNORMAL HIGH (ref 70–99)
Potassium: 4.6 mmol/L (ref 3.5–5.1)
Sodium: 139 mmol/L (ref 135–145)
Total Bilirubin: 0.9 mg/dL (ref 0.3–1.2)
Total Protein: 6.6 g/dL (ref 6.5–8.1)

## 2019-10-01 LAB — C-REACTIVE PROTEIN: CRP: 2.8 mg/dL — ABNORMAL HIGH (ref <1.0)

## 2019-10-01 LAB — CBC WITH DIFFERENTIAL/PLATELET
Abs Immature Granulocytes: 1.06 10*3/uL — ABNORMAL HIGH (ref 0.00–0.07)
Basophils Absolute: 0.1 10*3/uL (ref 0.0–0.1)
Basophils Relative: 1 %
Eosinophils Absolute: 0.2 10*3/uL (ref 0.0–0.5)
Eosinophils Relative: 2 %
HCT: 29.2 % — ABNORMAL LOW (ref 36.0–46.0)
Hemoglobin: 9.5 g/dL — ABNORMAL LOW (ref 12.0–15.0)
Immature Granulocytes: 10 %
Lymphocytes Relative: 28 %
Lymphs Abs: 3.1 10*3/uL (ref 0.7–4.0)
MCH: 28 pg (ref 26.0–34.0)
MCHC: 32.5 g/dL (ref 30.0–36.0)
MCV: 86.1 fL (ref 80.0–100.0)
Monocytes Absolute: 1.1 10*3/uL — ABNORMAL HIGH (ref 0.1–1.0)
Monocytes Relative: 10 %
Neutro Abs: 5.4 10*3/uL (ref 1.7–7.7)
Neutrophils Relative %: 49 %
Platelets: 466 10*3/uL — ABNORMAL HIGH (ref 150–400)
RBC: 3.39 MIL/uL — ABNORMAL LOW (ref 3.87–5.11)
RDW: 13.7 % (ref 11.5–15.5)
WBC: 11.1 10*3/uL — ABNORMAL HIGH (ref 4.0–10.5)
nRBC: 1.3 % — ABNORMAL HIGH (ref 0.0–0.2)

## 2019-10-01 LAB — FERRITIN: Ferritin: 391 ng/mL — ABNORMAL HIGH (ref 11–307)

## 2019-10-01 LAB — D-DIMER, QUANTITATIVE: D-Dimer, Quant: 1.03 ug/mL-FEU — ABNORMAL HIGH (ref 0.00–0.50)

## 2019-10-01 NOTE — Progress Notes (Signed)
Patient did not want me to call and update her family, she stated that she would do it.

## 2019-10-01 NOTE — Progress Notes (Signed)
[place] TEAM 1 - Stepdown/ICU TEAM  [name] Barker  ZOX:[id number] DOB: 11-Feb-1961 DOA: 09/27/2019 PCP: Marcine Matar, MD    Brief Narrative:  58 year old with a history of HTN, HLD, DM 2, and stage III CKD who presented to the ED with bilateral flank pain and report of a cough for several days.  She tested positive for SARS-CoV-2.  A chest x-ray noted bibasilar opacities and she became more tachypneic during her ED stay.  Significant Events: 10/4 admit to [facility] via [facility] ED  COVID-19 specific Treatment: Remdesivir 10/5 > Decadron 10/5  Subjective: Continues to enjoy saturations in the mid to low 90s on room air only.  No new complaints.  Is looking forward to being discharged home.  Reports good appetite.  Denies abdominal pain.  Assessment & Plan:  COVID pneumonia - acute hypoxic respiratory failure Steroid discontinued as patient is not requiring oxygen support -complete 5-day treatment course of Remdesivir -clinically stabilizing nicely -anticipate discharge after completion of Remdesivir course 10/9  Recent Labs  Lab 09/27/19 1930 09/28/19 0540 09/28/19 0615 09/29/19 0210 09/30/19 0215 10/01/19 0420  DDIMER  --  2.44*  --   --  1.20* 1.03*  FERRITIN  --  564*  --  395* 363*  --   CRP  --  2.9*  --  1.2* 0.9  --   ALT 63*  --  69* 61* 43 33  PROCALCITON  --  0.15  --   --   --   --     Acute kidney injury on stage III CKD Creatinine 2.01 at admission - no hydronephrosis on renal ultrasound - FeNa consistent with prerenal etiology - baseline creatinine 1.2 as of June 2020 -creatinine is close to baseline at this time  Recent Labs  Lab 09/27/19 1930 09/28/19 0615 09/29/19 0210 09/30/19 0215 10/01/19 0420  CREATININE 2.01* 2.30* 1.76* 1.25* 1.29*    Transaminitis LFTs have normalized  Mildly complicated bilateral renal cysts Noted on renal ultrasound -consider outpatient urology follow-up  HTN Blood pressure currently well controlled  DM 2 A1c  8% -CBG improving -continue to follow trend closely  HLD Continue Zocor  Depression Appears well controlled at this time  Morbid obesity - Body mass index is 34.39 kg/m.   DVT prophylaxis: Lovenox Code Status: FULL CODE Family Communication:  Disposition Plan: MedSurg bed -PT OT -anticipate discharge after completion of Remdesivir course  Consultants:  none  Antimicrobials:  None  Objective: Blood pressure 128/79, pulse 93, temperature 99.1 F (37.3 C), temperature source Oral, resp. rate 17, height 5\' 1"  (1.549 m), weight 82.6 kg, last menstrual period 02/21/2016, SpO2 94 %.  Intake/Output Summary (Last 24 hours) at 10/01/2019 0756 Last data filed at 10/01/2019 0538 Gross per 24 hour  Intake 1690 ml  Output 950 ml  Net 740 ml   Filed Weights   09/27/19 1658 09/27/19 2358  Weight: 86 kg 82.6 kg    Examination: General: No acute respiratory distress Lungs: Clear to auscultation with no wheezing Cardiovascular: RRR without murmur Abdomen: NT/ND, soft Extremities: No C/C/E B LE  CBC: Recent Labs  Lab 09/29/19 0210 09/30/19 0215 10/01/19 0420  WBC 8.7 10.7* 11.1*  NEUTROABS 5.6 6.0 5.4  HGB 9.3* 9.3* 9.5*  HCT 28.5* 28.3* 29.2*  MCV 86.4 86.8 86.1  PLT 401* 444* 466*   Basic Metabolic Panel: Recent Labs  Lab 09/29/19 0210 09/30/19 0215 10/01/19 0420  NA 141 141 139  K 4.7 4.5 4.6  CL 110 109 107  CO2 20* 18* 20*  GLUCOSE 93 91 133*  BUN 42* 35* 28*  CREATININE 1.76* 1.25* 1.29*  CALCIUM 8.8* 8.7* 8.8*   GFR: Estimated Creatinine Clearance: 46.3 mL/min (A) (by C-G formula based on SCr of 1.29 mg/dL (H)).  Liver Function Tests: Recent Labs  Lab 09/28/19 0615 09/29/19 0210 09/30/19 0215 10/01/19 0420  AST 91* 40 21 17  ALT 69* 61* 43 33  ALKPHOS 85 79 74 75  BILITOT 0.9 0.3 0.8 0.9  PROT 7.5 6.8 6.4* 6.6  ALBUMIN 3.4* 3.0* 3.0* 2.9*   Recent Labs  Lab 09/27/19 1930  LIPASE 47    HbA1C: HbA1c, POC (prediabetic range)  Date/Time  Value Ref Range Status  05/26/2018 10:28 AM 5.9 5.7 - 6.4 % Final   HbA1c, POC (controlled diabetic range)  Date/Time Value Ref Range Status  03/03/2019 11:31 AM 6.8 0.0 - 7.0 % Final  11/28/2018 10:24 AM 6.7 0.0 - 7.0 % Final   Hgb A1c MFr Bld  Date/Time Value Ref Range Status  06/05/2019 11:34 AM 8.0 (H) 4.8 - 5.6 % Final    Comment:             Prediabetes: 5.7 - 6.4          Diabetes: >6.4          Glycemic control for adults with diabetes: <7.0     CBG: Recent Labs  Lab 09/29/19 2102 09/30/19 0716 09/30/19 1106 09/30/19 1647 09/30/19 2021  GLUCAP 255* 86 174* 159* 251*    Recent Results (from the past 240 hour(s))  SARS Coronavirus 2 [facility] order, Performed in [facility] hospital lab) Nasopharyngeal Nasopharyngeal Swab     Status: Abnormal   Collection Time: 09/27/19  7:52 PM   Specimen: Nasopharyngeal Swab  Result Value Ref Range Status   SARS Coronavirus 2 POSITIVE (A) NEGATIVE Final    Comment: RESULT CALLED TO, READ BACK BY AND VERIFIED WITH: C,FRANKLIN AT 2305 ON 09/27/19 BY A,MOHAMED (NOTE) If result is NEGATIVE SARS-CoV-2 target nucleic acids are NOT DETECTED. The SARS-CoV-2 RNA is generally detectable in upper and lower  respiratory specimens during the acute phase of infection. The lowest  concentration of SARS-CoV-2 viral copies this assay can detect is 250  copies / mL. A negative result does not preclude SARS-CoV-2 infection  and should not be used as the sole basis for treatment or other  patient management decisions.  A negative result may occur with  improper specimen collection / handling, submission of specimen other  than nasopharyngeal swab, presence of viral mutation(s) within the  areas targeted by this assay, and inadequate number of viral copies  (<250 copies / mL). A negative result must be combined with clinical  observations, patient history, and epidemiological information. If result is POSITIVE SARS-CoV-2 target nucleic acids are  DETEC TED. The SARS-CoV-2 RNA is generally detectable in upper and lower  respiratory specimens during the acute phase of infection.  Positive  results are indicative of active infection with SARS-CoV-2.  Clinical  correlation with patient history and other diagnostic information is  necessary to determine patient infection status.  Positive results do  not rule out bacterial infection or co-infection with other viruses. If result is PRESUMPTIVE POSTIVE SARS-CoV-2 nucleic acids MAY BE PRESENT.   A presumptive positive result was obtained on the submitted specimen  and confirmed on repeat testing.  While 2019 novel coronavirus  (SARS-CoV-2) nucleic acids may be present in the submitted sample  additional confirmatory testing may be  necessary for epidemiological  and / or clinical management purposes  to differentiate between  SARS-CoV-2 and other Sarbecovirus currently known to infect humans.  If clinically indicated additional testing with an alternate test  methodology (LAB7 453) is advised. The SARS-CoV-2 RNA is generally  detectable in upper and lower respiratory specimens during the acute  phase of infection. The expected result is Negative. Fact Sheet for Patients:  BoilerBrush.com.cyhttps://www.fda.gov/media/136312/download Fact Sheet for Healthcare Providers: https://pope.com/https://www.fda.gov/media/136313/download This test is not yet approved or cleared by the [place] FDA and has been authorized for detection and/or diagnosis of SARS-CoV-2 by FDA under an Emergency Use Authorization (EUA).  This EUA will remain in effect (meaning this test can be used) for the duration of the COVID-19 declaration under Section 564(b)(1) of the Act, 21 U.S.C. section 360bbb-3(b)(1), unless the authorization is terminated or revoked sooner. Performed at [facility], [address] [place], [place] [address]   Culture, blood (Routine X 2) w Reflex to ID Panel     Status: None (Preliminary result)    Collection Time: 09/28/19  4:44 AM   Specimen: BLOOD RIGHT HAND  Result Value Ref Range Status   Specimen Description   Final    BLOOD RIGHT HAND Performed at [facility], [address] [place], [place] [address]    Special Requests   Final    BOTTLES DRAWN AEROBIC ONLY Blood Culture adequate volume Performed at [facility], [address] [place], [place] [address]    Culture   Final    NO GROWTH 2 DAYS Performed at [facility], [address] [place], [place] [address]    Report Status PENDING  Incomplete  Culture, blood (Routine X 2) w Reflex to ID Panel     Status: None (Preliminary result)   Collection Time: 09/28/19  4:44 AM   Specimen: BLOOD  Result Value Ref Range Status   Specimen Description   Final    BLOOD RIGHT ANTECUBITAL Performed at [facility], [address] [place], [place] [address]    Special Requests   Final    BOTTLES DRAWN AEROBIC ONLY Blood Culture adequate volume Performed at [facility], [address] [place], [place] [address]    Culture   Final    NO GROWTH 2 DAYS Performed at [facility], [address] [place], [place] [address]    Report Status PENDING  Incomplete  MRSA PCR Screening     Status: Abnormal   Collection Time: 09/28/19  5:06 AM   Specimen: Nasopharyngeal  Result Value Ref Range Status   MRSA by PCR POSITIVE (A) NEGATIVE Final    Comment:        The GeneXpert MRSA Assay (FDA approved for NASAL specimens only), is one component of a comprehensive MRSA colonization surveillance program. It is not intended to diagnose MRSA infection nor to guide or monitor treatment for MRSA infections. RESULT CALLED TO, READ BACK BY AND VERIFIED WITH:  Nelia ShiMACK, H 1148 09/28/19 JM Performed at [facility], [address] [place], [place] [address]      Scheduled Meds: . aspirin EC  81 mg Oral Daily  . Chlorhexidine Gluconate Cloth   6 each Topical Q0600  . enoxaparin (LOVENOX) injection  40 mg Subcutaneous Q24H  . insulin aspart  0-20 Units Subcutaneous TID WC  . insulin aspart  0-5 Units Subcutaneous QHS  . multivitamin with minerals  1 tablet Oral  Daily  . mupirocin ointment   Nasal BID  . omega-3 acid ethyl esters  1 g Oral Daily  . simvastatin  20 mg Oral QHS  . venlafaxine XR  75 mg Oral Q breakfast  . vitamin C  500 mg Oral Daily  . zinc sulfate  220 mg Oral Daily   Continuous Infusions: . remdesivir 100 mg in NS 250 mL Stopped (09/30/19 1133)     LOS: 3 days   Lonia Blood, MD [facility] Office  [phone] Pager - Text Page per Amion  If 7PM-7AM, please contact night-coverage per Amion 10/01/2019, 7:56 AM

## 2019-10-02 LAB — CBC WITH DIFFERENTIAL/PLATELET
Abs Immature Granulocytes: 0.7 10*3/uL — ABNORMAL HIGH (ref 0.00–0.07)
Band Neutrophils: 2 %
Basophils Absolute: 0 10*3/uL (ref 0.0–0.1)
Basophils Relative: 0 %
Eosinophils Absolute: 0.2 10*3/uL (ref 0.0–0.5)
Eosinophils Relative: 2 %
HCT: 31.4 % — ABNORMAL LOW (ref 36.0–46.0)
Hemoglobin: 10.3 g/dL — ABNORMAL LOW (ref 12.0–15.0)
Lymphocytes Relative: 15 %
Lymphs Abs: 1.8 10*3/uL (ref 0.7–4.0)
MCH: 28.2 pg (ref 26.0–34.0)
MCHC: 32.8 g/dL (ref 30.0–36.0)
MCV: 86 fL (ref 80.0–100.0)
Monocytes Absolute: 1 10*3/uL (ref 0.1–1.0)
Monocytes Relative: 8 %
Myelocytes: 6 %
Neutro Abs: 8.4 10*3/uL — ABNORMAL HIGH (ref 1.7–7.7)
Neutrophils Relative %: 67 %
Platelets: 518 10*3/uL — ABNORMAL HIGH (ref 150–400)
RBC: 3.65 MIL/uL — ABNORMAL LOW (ref 3.87–5.11)
RDW: 13.5 % (ref 11.5–15.5)
WBC: 12.2 10*3/uL — ABNORMAL HIGH (ref 4.0–10.5)
nRBC: 1.6 % — ABNORMAL HIGH (ref 0.0–0.2)

## 2019-10-02 LAB — COMPREHENSIVE METABOLIC PANEL WITH GFR
ALT: 27 U/L (ref 0–44)
AST: 13 U/L — ABNORMAL LOW (ref 15–41)
Albumin: 3 g/dL — ABNORMAL LOW (ref 3.5–5.0)
Alkaline Phosphatase: 81 U/L (ref 38–126)
Anion gap: 14 (ref 5–15)
BUN: 29 mg/dL — ABNORMAL HIGH (ref 6–20)
CO2: 22 mmol/L (ref 22–32)
Calcium: 9.3 mg/dL (ref 8.9–10.3)
Chloride: 103 mmol/L (ref 98–111)
Creatinine, Ser: 1.35 mg/dL — ABNORMAL HIGH (ref 0.44–1.00)
GFR calc Af Amer: 50 mL/min — ABNORMAL LOW
GFR calc non Af Amer: 43 mL/min — ABNORMAL LOW
Glucose, Bld: 167 mg/dL — ABNORMAL HIGH (ref 70–99)
Potassium: 4.8 mmol/L (ref 3.5–5.1)
Sodium: 139 mmol/L (ref 135–145)
Total Bilirubin: 1 mg/dL (ref 0.3–1.2)
Total Protein: 6.9 g/dL (ref 6.5–8.1)

## 2019-10-02 LAB — GLUCOSE, CAPILLARY
Glucose-Capillary: 181 mg/dL — ABNORMAL HIGH (ref 70–99)
Glucose-Capillary: 237 mg/dL — ABNORMAL HIGH (ref 70–99)
Glucose-Capillary: 205 mg/dL — ABNORMAL HIGH (ref 70–99)

## 2019-10-02 LAB — D-DIMER, QUANTITATIVE: D-Dimer, Quant: 0.82 ug{FEU}/mL — ABNORMAL HIGH (ref 0.00–0.50)

## 2019-10-02 LAB — C-REACTIVE PROTEIN: CRP: 2.7 mg/dL — ABNORMAL HIGH

## 2019-10-02 LAB — FERRITIN: Ferritin: 390 ng/mL — ABNORMAL HIGH (ref 11–307)

## 2019-10-02 MED ORDER — ACETAMINOPHEN 325 MG PO TABS: 650.0000 mg | ORAL_TABLET | Freq: Four times a day (QID) | ORAL | Status: DC | PRN

## 2019-10-02 NOTE — TOC Transition Note (Signed)
Transition of Care [facility]) - CM/SW Discharge Note   Patient Details  Name: [name] Barker MRN: [id number] Date of Birth: 08-Feb-1961  [facility] CM/SW Contact:  Ninfa Meeker, RN Phone Number: [phone] (working remotely) 10/02/2019, 11:37 AM   Clinical Narrative:   58 yr old female admitted and treated for COVID 19. Thankfully patient is improving and will discharge home. Patient has PCP for follow up. No Case manager needs identified.    Final next level of care: Home/Self Care Barriers to Discharge: No Barriers Identified   Patient Goals and CMS Choice        Discharge Placement                       Discharge Plan and Services                DME Arranged: N/A DME Agency: NA       HH Arranged: NA          Social Determinants of Health (SDOH) Interventions     Readmission Risk Interventions No flowsheet data found.

## 2019-10-02 NOTE — Discharge Summary (Signed)
DISCHARGE SUMMARY  [name] Barker  MR#: [id number]  DOB:Nov 17, 1961  Date of Admission: 09/27/2019 Date of Discharge: 10/02/2019  Attending Physician:[name] Biehn Hennie Duos, MD  Patient's ZDG:[id number], [name] Batman, MD  Consults: none   Disposition: D/C home   D/C Instructions Given to Patient: BASED ON YOUR POSITIVE COVID TEST DATE OF 09/27/2019 AND YOUR MILD COVID SYMPTOMS, YOU SHOULD REMAIN ON HOME QUARANTINE (AS DETAILED IN YOUR D/C PAPERWORK) FOR A MINIMUM OF 10 DAYS / UNTIL 10/07/2019  Follow-up Appts: Follow-up Information    [name] Pier, MD Follow up in 4 day(s).   Specialty: Internal Medicine Contact information: [place] [address] [phone]           Tests Needing Follow-up: -consider outpatient Urology follow-up for complex renal cysts  -Follow-up CBGs as patient may need further titration of diabetes treatment  Discharge Diagnoses: COVID pneumonia Acute hypoxic respiratory failure Acute kidney injury on stage III CKD Transaminitis Mildly complicated bilateral renal cysts HTN DM 2 HLD Depression Morbid obesity  COVID-19 specific Treatment: Remdesivir 10/5 > 10/9 Decadron 10/5  Initial presentation: 58 year old with a history of HTN, HLD, DM 2, and stage III CKD who presented to the ED with bilateral flank pain and report of a cough for several days.  She tested positive for SARS-CoV-2.  A chest x-ray noted bibasilar opacities and she became more tachypneic during her ED stay.  Hospital Course:  COVID pneumonia - acute hypoxic respiratory failure Steroid discontinued as patient did not require oxygen support following her admisison - completed a 5-day treatment course of Remdesivir - clinically stabilized nicely - discharged after completion of Remdesivir course  Acute kidney injury on stage III CKD Creatinine 2.01 at admission - no hydronephrosis on renal ultrasound - FeNa consistent with prerenal etiology - creatinine  rapidly improved - baseline creatinine 1.2 as of June 2020  Transaminitis LFTs have now normalized  Mildly complicated bilateral renal cysts Noted on renal ultrasound -consider outpatient urology follow-up  HTN Blood pressure controlled at time of discharge  DM 2 A1c 8% -CBG variable -follow without change in current treatment regimen at time of discharge  HLD Continue Zocor  Depression Appears well controlled at this time  Morbid obesity - Body mass index is 34.39 kg/m.   Allergies as of 10/02/2019   No Known Allergies     Medication List    TAKE these medications   Accu-Chek Nano SmartView w/Device Kit Use as directed for once daily testing of blood sugar. E11.9   Accu-Chek SmartView Control Liqd Use as directed   acetaminophen 325 MG tablet Commonly known as: TYLENOL Take 2 tablets (650 mg total) by mouth every 6 (six) hours as needed for mild pain or headache. What changed:   when to take this  reasons to take this   aspirin EC 81 MG tablet Take 1 tablet (81 mg total) by mouth daily.   Fish Oil 1000 MG Caps Take 1 capsule (1,000 mg total) by mouth daily.   glimepiride 2 MG tablet Commonly known as: AMARYL Take 1 tablet (2 mg total) by mouth daily before breakfast.   ketoconazole 2 % cream Commonly known as: NIZORAL Apply to affected area daily   losartan 50 MG tablet Commonly known as: COZAAR TAKE 1 TABLET ONE TIME DAILY  (STOP LISINOPRIL) What changed:   how much to take  how to take this  when to take this  additional instructions   metFORMIN 1000 MG tablet Commonly known as: GLUCOPHAGE Take 1  tablet (1,000 mg total) by mouth 2 (two) times daily with a meal.   multivitamin with minerals Tabs tablet Take 1 tablet by mouth daily.   simvastatin 20 MG tablet Commonly known as: ZOCOR Take 1 tablet (20 mg total) by mouth at bedtime.   tetrahydrozoline 0.05 % ophthalmic solution Place 2 drops into both eyes daily as needed (for  dry eyes).   venlafaxine XR 75 MG 24 hr capsule Commonly known as: EFFEXOR-XR Take 1 capsule (75 mg total) by mouth daily with breakfast.       Day of Discharge BP 134/89 (BP Location: Left Arm)   Pulse 100   Temp 98.9 F (37.2 C) (Oral)   Resp 18   Ht '5\' 1"'  (1.549 m)   Wt 82.6 kg   LMP 02/21/2016   SpO2 97%   BMI 34.39 kg/m   Physical Exam: General: No acute respiratory distress Lungs: Clear to auscultation bilaterally without wheezes or crackles Cardiovascular: Regular rate and rhythm without murmur gallop or rub normal S1 and S2 Abdomen: Nontender, nondistended, soft, bowel sounds positive, no rebound, no ascites, no appreciable mass Extremities: No significant cyanosis, clubbing, or edema bilateral lower extremities  Basic Metabolic Panel: Recent Labs  Lab 09/28/19 0615 09/29/19 0210 09/30/19 0215 10/01/19 0420 10/02/19 0335  NA 138 141 141 139 139  K 4.6 4.7 4.5 4.6 4.8  CL 105 110 109 107 103  CO2 21* 20* 18* 20* 22  GLUCOSE 128* 93 91 133* 167*  BUN 36* 42* 35* 28* 29*  CREATININE 2.30* 1.76* 1.25* 1.29* 1.35*  CALCIUM 8.7* 8.8* 8.7* 8.8* 9.3    Liver Function Tests: Recent Labs  Lab 09/28/19 0615 09/29/19 0210 09/30/19 0215 10/01/19 0420 10/02/19 0335  AST 91* 40 21 17 13*  ALT 69* 61* 43 33 27  ALKPHOS 85 79 74 75 81  BILITOT 0.9 0.3 0.8 0.9 1.0  PROT 7.5 6.8 6.4* 6.6 6.9  ALBUMIN 3.4* 3.0* 3.0* 2.9* 3.0*   Recent Labs  Lab 09/27/19 1930  LIPASE 47   CBC: Recent Labs  Lab 09/28/19 0615 09/29/19 0210 09/30/19 0215 10/01/19 0420 10/02/19 0335  WBC 6.2 8.7 10.7* 11.1* 12.2*  NEUTROABS 3.9 5.6 6.0 5.4 8.4*  HGB 10.6* 9.3* 9.3* 9.5* 10.3*  HCT 31.3* 28.5* 28.3* 29.2* 31.4*  MCV 85.5 86.4 86.8 86.1 86.0  PLT 371 401* 444* 466* 518*    CBG: Recent Labs  Lab 10/01/19 1612 10/01/19 2020 10/02/19 0722 10/02/19 1136 10/02/19 1654  GLUCAP 225* 260* 181* 237* 205*    Recent Results (from the past 240 hour(s))  SARS Coronavirus  2 [facility] order, Performed in [facility] hospital lab) Nasopharyngeal Nasopharyngeal Swab     Status: Abnormal   Collection Time: 09/27/19  7:52 PM   Specimen: Nasopharyngeal Swab  Result Value Ref Range Status   SARS Coronavirus 2 POSITIVE (A) NEGATIVE Final    Comment: RESULT CALLED TO, READ BACK BY AND VERIFIED WITH: C,FRANKLIN AT 2305 ON 09/27/19 BY A,MOHAMED (NOTE) If result is NEGATIVE SARS-CoV-2 target nucleic acids are NOT DETECTED. The SARS-CoV-2 RNA is generally detectable in upper and lower  respiratory specimens during the acute phase of infection. The lowest  concentration of SARS-CoV-2 viral copies this assay can detect is 250  copies / mL. A negative result does not preclude SARS-CoV-2 infection  and should not be used as the sole basis for treatment or other  patient management decisions.  A negative result may occur with  improper specimen collection /  handling, submission of specimen other  than nasopharyngeal swab, presence of viral mutation(s) within the  areas targeted by this assay, and inadequate number of viral copies  (<250 copies / mL). A negative result must be combined with clinical  observations, patient history, and epidemiological information. If result is POSITIVE SARS-CoV-2 target nucleic acids are DETEC TED. The SARS-CoV-2 RNA is generally detectable in upper and lower  respiratory specimens during the acute phase of infection.  Positive  results are indicative of active infection with SARS-CoV-2.  Clinical  correlation with patient history and other diagnostic information is  necessary to determine patient infection status.  Positive results do  not rule out bacterial infection or co-infection with other viruses. If result is PRESUMPTIVE POSTIVE SARS-CoV-2 nucleic acids MAY BE PRESENT.   A presumptive positive result was obtained on the submitted specimen  and confirmed on repeat testing.  While 2019 novel coronavirus  (SARS-CoV-2) nucleic acids  may be present in the submitted sample  additional confirmatory testing may be necessary for epidemiological  and / or clinical management purposes  to differentiate between  SARS-CoV-2 and other Sarbecovirus currently known to infect humans.  If clinically indicated additional testing with an alternate test  methodology (LAB7 453) is advised. The SARS-CoV-2 RNA is generally  detectable in upper and lower respiratory specimens during the acute  phase of infection. The expected result is Negative. Fact Sheet for Patients:  StrictlyIdeas.no Fact Sheet for Healthcare Providers: BankingDealers.co.za This test is not yet approved or cleared by the [place] FDA and has been authorized for detection and/or diagnosis of SARS-CoV-2 by FDA under an Emergency Use Authorization (EUA).  This EUA will remain in effect (meaning this test can be used) for the duration of the COVID-19 declaration under Section 564(b)(1) of the Act, 21 U.S.C. section 360bbb-3(b)(1), unless the authorization is terminated or revoked sooner. Performed at [facility] [place] [address], [place], [place] [address]   Culture, blood (Routine X 2) w Reflex to ID Panel     Status: None (Preliminary result)   Collection Time: 09/28/19  4:44 AM   Specimen: BLOOD RIGHT HAND  Result Value Ref Range Status   Specimen Description   Final    BLOOD RIGHT HAND Performed at [facility] [address], [place], [place] [address]    Special Requests   Final    BOTTLES DRAWN AEROBIC ONLY Blood Culture adequate volume Performed at [facility] [address] [place], [place] [address]    Culture   Final    NO GROWTH 3 DAYS Performed at [facility], [place] [address], [place], [place] [address]    Report Status PENDING  Incomplete  Culture, blood (Routine X 2) w Reflex to ID Panel     Status: None (Preliminary result)    Collection Time: 09/28/19  4:44 AM   Specimen: BLOOD  Result Value Ref Range Status   Specimen Description   Final    BLOOD RIGHT ANTECUBITAL Performed at [facility], [place] [address], [place], [place] [address]    Special Requests   Final    BOTTLES DRAWN AEROBIC ONLY Blood Culture adequate volume Performed at [place] [address], [place], [place] [address]    Culture   Final    NO GROWTH 3 DAYS Performed at [facility], [place] [address] [place], [place] [address]    Report Status PENDING  Incomplete  MRSA PCR  Screening     Status: Abnormal   Collection Time: 09/28/19  5:06 AM   Specimen: Nasopharyngeal  Result Value Ref Range Status   MRSA by PCR POSITIVE (A) NEGATIVE Final    Comment:        The GeneXpert MRSA Assay (FDA approved for NASAL specimens only), is one component of a comprehensive MRSA colonization surveillance program. It is not intended to diagnose MRSA infection nor to guide or monitor treatment for MRSA infections. RESULT CALLED TO, READ BACK BY AND VERIFIED WITH:  Dawson Bills 1148 09/28/19 JM Performed at [facility], [place] [address] [place], [place] [address]      Time spent in discharge (includes decision making & examination of pt): 35 minutes  10/02/2019, 5:14 PM   Cherene Altes, MD [facility] Office  [phone]

## 2019-10-02 NOTE — Progress Notes (Addendum)
Called brother for updates. No answer

## 2019-10-02 NOTE — Progress Notes (Signed)
Pt d/c home. Pt pastor picked her up for d/c. D/C papers and instructions on covid care provided and retrieved. Pt stated she understood instructions. IV removed. No pain.

## 2019-10-02 NOTE — Discharge Instructions (Signed)
BASED ON YOUR POSITIVE COVID TEST DATE OF 09/27/2019 AND YOUR MILD COVID SYMPTOMS, YOU SHOULD REMAIN ON HOME QUARANTINE (AS DETAILED BELOW) FOR A MINIMUM OF 10 DAYS / UNTIL 10/07/2019  COVID-19 COVID-19 is a respiratory infection that is caused by a virus called severe acute respiratory syndrome coronavirus 2 (SARS-CoV-2). The disease is also known as coronavirus disease or novel coronavirus. In some people, the virus may not cause any symptoms. In others, it may cause a serious infection. The infection can get worse quickly and can lead to complications, such as:  Pneumonia, or infection of the lungs.  Acute respiratory distress syndrome or ARDS. This is fluid build-up in the lungs.  Acute respiratory failure. This is a condition in which there is not enough oxygen passing from the lungs to the body.  Sepsis or septic shock. This is a serious bodily reaction to an infection.  Blood clotting problems.  Secondary infections due to bacteria or fungus. The virus that causes COVID-19 is contagious. This means that it can spread from person to person through droplets from coughs and sneezes (respiratory secretions). What are the causes? This illness is caused by a virus. You may catch the virus by:  Breathing in droplets from an infected person's cough or sneeze.  Touching something, like a table or a doorknob, that was exposed to the virus (contaminated) and then touching your mouth, nose, or eyes. What increases the risk? Risk for infection You are more likely to be infected with this virus if you:  Live in or travel to an area with a COVID-19 outbreak.  Come in contact with a sick person who recently traveled to an area with a COVID-19 outbreak.  Provide care for or live with a person who is infected with COVID-19. Risk for serious illness You are more likely to become seriously ill from the virus if you:  Are 73 years of age or older.  Have a long-term disease that lowers your  body's ability to fight infection (immunocompromised).  Live in a nursing home or long-term care facility.  Have a long-term (chronic) disease such as: ? Chronic lung disease, including chronic obstructive pulmonary disease or asthma ? Heart disease. ? Diabetes. ? Chronic kidney disease. ? Liver disease.  Are obese. What are the signs or symptoms? Symptoms of this condition can range from mild to severe. Symptoms may appear any time from 2 to 14 days after being exposed to the virus. They include:  A fever.  A cough.  Difficulty breathing.  Chills.  Muscle pains.  A sore throat.  Loss of taste or smell. Some people may also have stomach problems, such as nausea, vomiting, or diarrhea. Other people may not have any symptoms of COVID-19. How is this diagnosed? This condition may be diagnosed based on:  Your signs and symptoms, especially if: ? You live in an area with a COVID-19 outbreak. ? You recently traveled to or from an area where the virus is common. ? You provide care for or live with a person who was diagnosed with COVID-19.  A physical exam.  Lab tests, which may include: ? A nasal swab to take a sample of fluid from your nose. ? A throat swab to take a sample of fluid from your throat. ? A sample of mucus from your lungs (sputum). ? Blood tests.  Imaging tests, which may include, X-rays, CT scan, or ultrasound. How is this treated? At present, there is no medicine to treat COVID-19. Medicines that treat  other diseases are being used on a trial basis to see if they are effective against COVID-19. Your health care provider will talk with you about ways to treat your symptoms. For most people, the infection is mild and can be managed at home with rest, fluids, and over-the-counter medicines. Treatment for a serious infection usually takes places in a hospital intensive care unit (ICU). It may include one or more of the following treatments. These treatments are  given until your symptoms improve.  Receiving fluids and medicines through an IV.  Supplemental oxygen. Extra oxygen is given through a tube in the nose, a face mask, or a hood.  Positioning you to lie on your stomach (prone position). This makes it easier for oxygen to get into the lungs.  Continuous positive airway pressure (CPAP) or bi-level positive airway pressure (BPAP) machine. This treatment uses mild air pressure to keep the airways open. A tube that is connected to a motor delivers oxygen to the body.  Ventilator. This treatment moves air into and out of the lungs by using a tube that is placed in your windpipe.  Tracheostomy. This is a procedure to create a hole in the neck so that a breathing tube can be inserted.  Extracorporeal membrane oxygenation (ECMO). This procedure gives the lungs a chance to recover by taking over the functions of the heart and lungs. It supplies oxygen to the body and removes carbon dioxide. Follow these instructions at home: Lifestyle  If you are sick, stay home except to get medical care. Your health care provider will tell you how long to stay home. Call your health care provider before you go for medical care.  Rest at home as told by your health care provider.  Do not use any products that contain nicotine or tobacco, such as cigarettes, e-cigarettes, and chewing tobacco. If you need help quitting, ask your health care provider.  Return to your normal activities as told by your health care provider. Ask your health care provider what activities are safe for you. General instructions  Take over-the-counter and prescription medicines only as told by your health care provider.  Drink enough fluid to keep your urine pale yellow.  Keep all follow-up visits as told by your health care provider. This is important. How is this prevented?  There is no vaccine to help prevent COVID-19 infection. However, there are steps you can take to protect  yourself and others from this virus. To protect yourself:   Do not travel to areas where COVID-19 is a risk. The areas where COVID-19 is reported change often. To identify high-risk areas and travel restrictions, check the CDC travel website: StageSync.si  If you live in, or must travel to, an area where COVID-19 is a risk, take precautions to avoid infection. ? Stay away from people who are sick. ? Wash your hands often with soap and water for 20 seconds. If soap and water are not available, use an alcohol-based hand sanitizer. ? Avoid touching your mouth, face, eyes, or nose. ? Avoid going out in public, follow guidance from your state and local health authorities. ? If you must go out in public, wear a cloth face covering or face mask. ? Disinfect objects and surfaces that are frequently touched every day. This may include:  Counters and tables.  Doorknobs and light switches.  Sinks and faucets.  Electronics, such as phones, remote controls, keyboards, computers, and tablets. To protect others: If you have symptoms of COVID-19, take steps to  prevent the virus from spreading to others.  If you think you have a COVID-19 infection, contact your health care provider right away. Tell your health care team that you think you may have a COVID-19 infection.  Stay home. Leave your house only to seek medical care. Do not use public transport.  Do not travel while you are sick.  Wash your hands often with soap and water for 20 seconds. If soap and water are not available, use alcohol-based hand sanitizer.  Stay away from other members of your household. Let healthy household members care for children and pets, if possible. If you have to care for children or pets, wash your hands often and wear a mask. If possible, stay in your own room, separate from others. Use a different bathroom.  Make sure that all people in your household wash their hands well and often.  Cough or  sneeze into a tissue or your sleeve or elbow. Do not cough or sneeze into your hand or into the air.  Wear a cloth face covering or face mask. Where to find more information  Centers for Disease Control and Prevention: StickerEmporium.tnwww.cdc.gov/coronavirus/2019-ncov/index.html  World Health Organization: https://thompson-craig.com/www.who.int/health-topics/coronavirus Contact a health care provider if:  You live in or have traveled to an area where COVID-19 is a risk and you have symptoms of the infection.  You have had contact with someone who has COVID-19 and you have symptoms of the infection. Get help right away if:  You have trouble breathing.  You have pain or pressure in your chest.  You have confusion.  You have bluish lips and fingernails.  You have difficulty waking from sleep.  You have symptoms that get worse. These symptoms may represent a serious problem that is an emergency. Do not wait to see if the symptoms will go away. Get medical help right away. Call your local emergency services (911 in the [place] Do not drive yourself to the hospital. Let the emergency medical personnel know if you think you have COVID-19. Summary  COVID-19 is a respiratory infection that is caused by a virus. It is also known as coronavirus disease or novel coronavirus. It can cause serious infections, such as pneumonia, acute respiratory distress syndrome, acute respiratory failure, or sepsis.  The virus that causes COVID-19 is contagious. This means that it can spread from person to person through droplets from coughs and sneezes.  You are more likely to develop a serious illness if you are 59 years of age or older, have a weak immunity, live in a nursing home, or have chronic disease.  There is no medicine to treat COVID-19. Your health care provider will talk with you about ways to treat your symptoms.  Take steps to protect yourself and others from infection. Wash your hands often and disinfect objects and surfaces that are  frequently touched every day. Stay away from people who are sick and wear a mask if you are sick. This information is not intended to replace advice given to you by your health care provider. Make sure you discuss any questions you have with your health care provider. Document Released: 01/15/2019 Document Revised: 05/07/2019 Document Reviewed: 01/15/2019 Elsevier Patient Education  2020 Else[employer]     Person Under Monitoring Name: [name] SliderShirley Wisser  Location: [address] [place] [id number]   Infection Prevention Recommendations for Individuals Confirmed to have, or Being Evaluated for, 2019 Novel Coronavirus (COVID-19) Infection Who Receive Care at Home  Individuals who are confirmed to have, or  are being evaluated for, COVID-19 should follow the prevention steps below until a healthcare provider or local or state health department says they can return to normal activities.  Stay home except to get medical care You should restrict activities outside your home, except for getting medical care. Do not go to work, school, or public areas, and do not use public transportation or taxis.  Call ahead before visiting your doctor Before your medical appointment, call the healthcare provider and tell them that you have, or are being evaluated for, COVID-19 infection. This will help the healthcare providers office take steps to keep other people from getting infected. Ask your healthcare provider to call the local or state health department.  Monitor your symptoms Seek prompt medical attention if your illness is worsening (e.g., difficulty breathing). Before going to your medical appointment, call the healthcare provider and tell them that you have, or are being evaluated for, COVID-19 infection. Ask your healthcare provider to call the local or state health department.  Wear a facemask You should wear a facemask that covers your nose and mouth when you are in the same room with  other people and when you visit a healthcare provider. People who live with or visit you should also wear a facemask while they are in the same room with you.  Separate yourself from other people in your home As much as possible, you should stay in a different room from other people in your home. Also, you should use a separate bathroom, if available.  Avoid sharing household items You should not share dishes, drinking glasses, cups, eating utensils, towels, bedding, or other items with other people in your home. After using these items, you should wash them thoroughly with soap and water.  Cover your coughs and sneezes Cover your mouth and nose with a tissue when you cough or sneeze, or you can cough or sneeze into your sleeve. Throw used tissues in a lined trash can, and immediately wash your hands with soap and water for at least 20 seconds or use an alcohol-based hand rub.  Wash your [employer] your hands often and thoroughly with soap and water for at least 20 seconds. You can use an alcohol-based hand sanitizer if soap and water are not available and if your hands are not visibly dirty. Avoid touching your eyes, nose, and mouth with unwashed hands.   Prevention Steps for Caregivers and Household Members of Individuals Confirmed to have, or Being Evaluated for, COVID-19 Infection Being Cared for in the Home  If you live with, or provide care at home for, a person confirmed to have, or being evaluated for, COVID-19 infection please follow these guidelines to prevent infection:  Follow healthcare providers instructions Make sure that you understand and can help the patient follow any healthcare provider instructions for all care.  Provide for the patients basic needs You should help the patient with basic needs in the home and provide support for getting groceries, prescriptions, and other personal needs.  Monitor the patients symptoms If they are getting sicker, call his  or her medical provider and tell them that the patient has, or is being evaluated for, COVID-19 infection. This will help the healthcare providers office take steps to keep other people from getting infected. Ask the healthcare provider to call the local or state health department.  Limit the number of people who have contact with the patient  If possible, have only one caregiver for the patient.  Other household members should  stay in another home or place of residence. If this is not possible, they should stay  in another room, or be separated from the patient as much as possible. Use a separate bathroom, if available.  Restrict visitors who do not have an essential need to be in the home.  Keep older adults, very young children, and other sick people away from the patient Keep older adults, very young children, and those who have compromised immune systems or chronic health conditions away from the patient. This includes people with chronic heart, lung, or kidney conditions, diabetes, and cancer.  Ensure good ventilation Make sure that shared spaces in the home have good air flow, such as from an air conditioner or an opened window, weather permitting.  Wash your hands often  Wash your hands often and thoroughly with soap and water for at least 20 seconds. You can use an alcohol based hand sanitizer if soap and water are not available and if your hands are not visibly dirty.  Avoid touching your eyes, nose, and mouth with unwashed hands.  Use disposable paper towels to dry your hands. If not available, use dedicated cloth towels and replace them when they become wet.  Wear a facemask and gloves  Wear a disposable facemask at all times in the room and gloves when you touch or have contact with the patients blood, body fluids, and/or secretions or excretions, such as sweat, saliva, sputum, nasal mucus, vomit, urine, or feces.  Ensure the mask fits over your nose and mouth tightly,  and do not touch it during use.  Throw out disposable facemasks and gloves after using them. Do not reuse.  Wash your hands immediately after removing your facemask and gloves.  If your personal clothing becomes contaminated, carefully remove clothing and launder. Wash your hands after handling contaminated clothing.  Place all used disposable facemasks, gloves, and other waste in a lined container before disposing them with other household waste.  Remove gloves and wash your hands immediately after handling these items.  Do not share dishes, glasses, or other household items with the patient  Avoid sharing household items. You should not share dishes, drinking glasses, cups, eating utensils, towels, bedding, or other items with a patient who is confirmed to have, or being evaluated for, COVID-19 infection.  After the person uses these items, you should wash them thoroughly with soap and water.  Wash laundry thoroughly  Immediately remove and wash clothes or bedding that have blood, body fluids, and/or secretions or excretions, such as sweat, saliva, sputum, nasal mucus, vomit, urine, or feces, on them.  Wear gloves when handling laundry from the patient.  Read and follow directions on labels of laundry or clothing items and detergent. In general, wash and dry with the warmest temperatures recommended on the label.  Clean all areas the individual has used often  Clean all touchable surfaces, such as counters, tabletops, doorknobs, bathroom fixtures, toilets, phones, keyboards, tablets, and bedside tables, every day. Also, clean any surfaces that may have blood, body fluids, and/or secretions or excretions on them.  Wear gloves when cleaning surfaces the patient has come in contact with.  Use a diluted bleach solution (e.g., dilute bleach with 1 part bleach and 10 parts water) or a household disinfectant with a label that says EPA-registered for coronaviruses. To make a bleach solution  at home, add 1 tablespoon of bleach to 1 quart (4 cups) of water. For a larger supply, add  cup of bleach to 1 gallon (  16 cups) of water.  Read labels of cleaning products and follow recommendations provided on product labels. Labels contain instructions for safe and effective use of the cleaning product including precautions you should take when applying the product, such as wearing gloves or eye protection and making sure you have good ventilation during use of the product.  Remove gloves and wash hands immediately after cleaning.  Monitor yourself for signs and symptoms of illness Caregivers and household members are considered close contacts, should monitor their health, and will be asked to limit movement outside of the home to the extent possible. Follow the monitoring steps for close contacts listed on the symptom monitoring form.   ? If you have additional questions, contact your local health department or call the epidemiologist on call at [phone] (available 24/7). ? This guidance is subject to change. For the most up-to-date guidance from [facility], please refer to their website: TripMetro.hu   COVID-19 Frequently Asked Questions COVID-19 (coronavirus disease) is an infection that is caused by a large family of viruses. Some viruses cause illness in people and others cause illness in animals like camels, cats, and bats. In some cases, the viruses that cause illness in animals can spread to humans. Where did the coronavirus come from? In December 2019, [place] told the [employer] [facility]) of several cases of lung disease (human respiratory illness). These cases were linked to an open seafood and livestock market in the city of [place]. The link to the seafood and livestock market suggests that the virus may have spread from animals to humans. However, since that first outbreak in December, the virus has also been  shown to spread from person to person. What is the name of the disease and the virus? Disease name Early on, this disease was called novel coronavirus. This is because scientists determined that the disease was caused by a new (novel) respiratory virus. The World Health Organization [facility]) has now named the disease COVID-19, or coronavirus disease. Virus name The virus that causes the disease is called severe acute respiratory syndrome coronavirus 2 (SARS-CoV-2). More information on disease and virus naming World Health Organization [facility]): www.who.int/emergencies/diseases/novel-coronavirus-2019/technical-guidance/naming-the-coronavirus-disease-(covid-2019)-and-the-virus-that-causes-it Who is at risk for complications from coronavirus disease? Some people may be at higher risk for complications from coronavirus disease. This includes older adults and people who have chronic diseases, such as heart disease, diabetes, and lung disease. If you are at higher risk for complications, take these extra precautions:  Avoid close contact with people who are sick or have a fever or cough. Stay at least 3-6 ft (1-2 m) away from them, if possible.  Wash your hands often with soap and water for at least 20 seconds.  Avoid touching your face, mouth, nose, or eyes.  Keep supplies on hand at home, such as food, medicine, and cleaning supplies.  Stay home as much as possible.  Avoid social gatherings and travel. How does coronavirus disease spread? The virus that causes coronavirus disease spreads easily from person to person (is contagious). There are also cases of community-spread disease. This means the disease has spread to:  People who have no known contact with other infected people.  People who have not traveled to areas where there are known cases. It appears to spread from one person to another through droplets from coughing or sneezing. Can I get the virus from touching surfaces or  objects? There is still a lot that we do not know about the virus that causes coronavirus disease. Scientists are basing  a lot of information on what they know about similar viruses, such as:  Viruses cannot generally survive on surfaces for long. They need a human body (host) to survive.  It is more likely that the virus is spread by close contact with people who are sick (direct contact), such as through: ? Shaking hands or hugging. ? Breathing in respiratory droplets that travel through the air. This can happen when an infected person coughs or sneezes on or near other people.  It is less likely that the virus is spread when a person touches a surface or object that has the virus on it (indirect contact). The virus may be able to enter the body if the person touches a surface or object and then touches his or her face, eyes, nose, or mouth. Can a person spread the virus without having symptoms of the disease? It may be possible for the virus to spread before a person has symptoms of the disease, but this is most likely not the main way the virus is spreading. It is more likely for the virus to spread by being in close contact with people who are sick and breathing in the respiratory droplets of a sick person's cough or sneeze. What are the symptoms of coronavirus disease? Symptoms vary from person to person and can range from mild to severe. Symptoms may include:  Fever.  Cough.  Tiredness, weakness, or fatigue.  Fast breathing or feeling short of breath. These symptoms can appear anywhere from 2 to 14 days after you have been exposed to the virus. If you develop symptoms, call your health care provider. People with severe symptoms may need hospital care. If I am exposed to the virus, how long does it take before symptoms start? Symptoms of coronavirus disease may appear anywhere from 2 to 14 days after a person has been exposed to the virus. If you develop symptoms, call your health care  provider. Should I be tested for this virus? Your health care provider will decide whether to test you based on your symptoms, history of exposure, and your risk factors. How does a health care provider test for this virus? Health care providers will collect samples to send for testing. Samples may include:  Taking a swab of fluid from the nose.  Taking fluid from the lungs by having you cough up mucus (sputum) into a sterile cup.  Taking a blood sample.  Taking a stool or urine sample. Is there a treatment or vaccine for this virus? Currently, there is no vaccine to prevent coronavirus disease. Also, there are no medicines like antibiotics or antivirals to treat the virus. A person who becomes sick is given supportive care, which means rest and fluids. A person may also relieve his or her symptoms by using over-the-counter medicines that treat sneezing, coughing, and runny nose. These are the same medicines that a person takes for the common cold. If you develop symptoms, call your health care provider. People with severe symptoms may need hospital care. What can I do to protect myself and my family from this virus?     You can protect yourself and your family by taking the same actions that you would take to prevent the spread of other viruses. Take the following actions:  Wash your hands often with soap and water for at least 20 seconds. If soap and water are not available, use alcohol-based hand sanitizer.  Avoid touching your face, mouth, nose, or eyes.  Cough or sneeze into a  tissue, sleeve, or elbow. Do not cough or sneeze into your hand or the air. ? If you cough or sneeze into a tissue, throw it away immediately and wash your hands.  Disinfect objects and surfaces that you frequently touch every day.  Avoid close contact with people who are sick or have a fever or cough. Stay at least 3-6 ft (1-2 m) away from them, if possible.  Stay home if you are sick, except to get  medical care. Call your health care provider before you get medical care.  Make sure your vaccines are up to date. Ask your health care provider what vaccines you need. What should I do if I need to travel? Follow travel recommendations from your local health authority, the CDC, and WHO. Travel information and advice  Centers for Disease Control and Prevention (CDC): GeminiCard.gl  World Health Organization [facility]): PreviewDomains.se Know the risks and take action to protect your health  You are at higher risk of getting coronavirus disease if you are traveling to areas with an outbreak or if you are exposed to travelers from areas with an outbreak.  Wash your hands often and practice good hygiene to lower the risk of catching or spreading the virus. What should I do if I am sick? General instructions to stop the spread of infection  Wash your hands often with soap and water for at least 20 seconds. If soap and water are not available, use alcohol-based hand sanitizer.  Cough or sneeze into a tissue, sleeve, or elbow. Do not cough or sneeze into your hand or the air.  If you cough or sneeze into a tissue, throw it away immediately and wash your hands.  Stay home unless you must get medical care. Call your health care provider or local health authority before you get medical care.  Avoid public areas. Do not take public transportation, if possible.  If you can, wear a mask if you must go out of the house or if you are in close contact with someone who is not sick. Keep your home clean  Disinfect objects and surfaces that are frequently touched every day. This may include: ? Counters and tables. ? Doorknobs and light switches. ? Sinks and faucets. ? Electronics such as phones, remote controls, keyboards, computers, and tablets.  Wash dishes in hot, soapy water or use a dishwasher. Air-dry  your dishes.  Wash laundry in hot water. Prevent infecting other household members  Let healthy household members care for children and pets, if possible. If you have to care for children or pets, wash your hands often and wear a mask.  Sleep in a different bedroom or bed, if possible.  Do not share personal items, such as razors, toothbrushes, deodorant, combs, brushes, towels, and washcloths. Where to find more information Centers for Disease Control and Prevention (CDC)  Information and news updates: CardRetirement.cz World Health Organization [facility])  Information and news updates: AffordableSalon.es  Coronavirus health topic: https://thompson-craig.com/  Questions and answers on COVID-19: kruiseway.com  Global tracker: who.sprinklr.com American Academy of Pediatrics (AAP)  Information for families: www.healthychildren.org/English/health-issues/conditions/chest-lungs/Pages/2019-Novel-Coronavirus.aspx The coronavirus situation is changing rapidly. Check your local health authority website or the CDC and [facility] websites for updates and news. When should I contact a health care provider?  Contact your health care provider if you have symptoms of an infection, such as fever or cough, and you: ? Have been near anyone who is known to have coronavirus disease. ? Have come into contact with a person who  is suspected to have coronavirus disease. ? Have traveled outside of the country. When should I get emergency medical care?  Get help right away by calling your local emergency services (911 in the [place] if you have: ? Trouble breathing. ? Pain or pressure in your chest. ? Confusion. ? Blue-tinged lips and fingernails. ? Difficulty waking from sleep. ? Symptoms that get worse. Let the emergency medical personnel know if you think you have coronavirus disease. Summary  A new  respiratory virus is spreading from person to person and causing COVID-19 (coronavirus disease).  The virus that causes COVID-19 appears to spread easily. It spreads from one person to another through droplets from coughing or sneezing.  Older adults and those with chronic diseases are at higher risk of disease. If you are at higher risk for complications, take extra precautions.  There is currently no vaccine to prevent coronavirus disease. There are no medicines, such as antibiotics or antivirals, to treat the virus.  You can protect yourself and your family by washing your hands often, avoiding touching your face, and covering your coughs and sneezes. This information is not intended to replace advice given to you by your health care provider. Make sure you discuss any questions you have with your health care provider. Document Released: 04/07/2019 Document Revised: 04/07/2019 Document Reviewed: 04/07/2019 Elsevier Patient Education  2020 [employer].

## 2019-10-02 NOTE — Evaluation (Signed)
Physical Therapy Evaluation Patient Details Name: [name] Barker MRN: [id number] DOB: May 14, 1961 Today's Date: 10/02/2019   History of Present Illness  58 year old with a history of HTN, HLD, DM 2, and stage III CKD who presented to the ED with bilateral flank pain and report of a cough for several days.  She tested positive for SARS-CoV-2.  A chest x-ray noted bibasilar opacities and she became more tachypneic during her ED stay  Clinical Impression    The patient is mobiizing well. Ambulated on RA x 440', SPO2 955. Patient will progress to Dc home soon.  Follow Up Recommendations   none   Equipment Recommendations  None recommended by PT    Recommendations for Other Services       Precautions / Restrictions Precautions Precaution Comments: spo2      Mobility  Bed Mobility               General bed mobility comments: oob  Transfers Overall transfer level: Independent               General transfer comment: observed indep to [facility]  Ambulation/Gait Ambulation/Gait assistance: Supervision Gait Distance (Feet): 400 Feet Assistive device: None Gait Pattern/deviations: Step-through pattern   Gait velocity interpretation: <1.31 ft/sec, indicative of household ambulator    Stairs            Wheelchair Mobility    Modified Rankin (Stroke Patients Only)       Balance                                             Pertinent Vitals/Pain      Home Living Family/patient expects to be discharged to:: Private residence Living Arrangements: Other (Comment);Other relatives Available Help at Discharge: Family Type of Home: House Home Access: Stairs to enter Entrance Stairs-Rails: Psychiatric nurse of Steps: 3 Home Layout: One level Home Equipment: None      Prior Function Level of Independence: Independent               Hand Dominance        Extremity/Trunk Assessment   Upper Extremity Assessment Upper  Extremity Assessment: Overall WFL for tasks assessed    Lower Extremity Assessment Lower Extremity Assessment: Overall WFL for tasks assessed    Cervical / Trunk Assessment Cervical / Trunk Assessment: Normal  Communication   Communication: No difficulties  Cognition Arousal/Alertness: Awake/alert Behavior During Therapy: WFL for tasks assessed/performed Overall Cognitive Status: Within Functional Limits for tasks assessed                                        General Comments      Exercises     Assessment/Plan    PT Assessment Patient needs continued PT services  PT Problem List Decreased activity tolerance       PT Treatment Interventions Gait training    PT Goals (Current goals can be found in the Care Plan section)  Acute Rehab PT Goals Patient Stated Goal: go home PT Goal Formulation: With patient Time For Goal Achievement: 10/16/19 Potential to Achieve Goals: Good    Frequency Min 3X/week   Barriers to discharge        Co-evaluation  AM-PAC PT "6 Clicks" Mobility  Outcome Measure Help needed turning from your back to your side while in a flat bed without using bedrails?: None Help needed moving from lying on your back to sitting on the side of a flat bed without using bedrails?: None Help needed moving to and from a bed to a chair (including a wheelchair)?: None Help needed standing up from a chair using your arms (e.g., wheelchair or bedside chair)?: None Help needed to walk in hospital room?: A Little Help needed climbing 3-5 steps with a railing? : A Little 6 Click Score: 22    End of Session   Activity Tolerance: Patient tolerated treatment well Patient left: in chair Nurse Communication: Mobility status PT Visit Diagnosis: Difficulty in walking, not elsewhere classified (R26.2)    Time: 7564-3329 PT Time Calculation (min) (ACUTE ONLY): 25 min   Charges:   PT Evaluation $PT Eval Low Complexity: 1  Low PT Treatments $Gait Training: 8-22 mins        Blanchard Kelch PT Acute Rehabilitation Services Pager [phone] Office [phone]   Rada Hay 10/02/2019, 5:34 PM

## 2019-10-03 LAB — CULTURE, BLOOD (ROUTINE X 2)
Culture: NO GROWTH
Culture: NO GROWTH
Special Requests: ADEQUATE
Special Requests: ADEQUATE

## 2019-10-14 ENCOUNTER — Other Ambulatory Visit: Payer: Self-pay

## 2019-10-14 DIAGNOSIS — Z20828 Contact with and (suspected) exposure to other viral communicable diseases: Secondary | ICD-10-CM | POA: Diagnosis not present

## 2019-10-14 DIAGNOSIS — Z20822 Contact with and (suspected) exposure to covid-19: Secondary | ICD-10-CM

## 2019-10-15 LAB — NOVEL CORONAVIRUS, NAA: SARS-CoV-2, NAA: NOT DETECTED

## 2019-11-04 ENCOUNTER — Other Ambulatory Visit: Payer: Self-pay

## 2019-11-04 ENCOUNTER — Ambulatory Visit: Payer: Medicare PPO | Attending: Family Medicine | Admitting: Family Medicine

## 2019-11-04 ENCOUNTER — Encounter: Payer: Self-pay | Admitting: Family Medicine

## 2019-11-04 VITALS — BP 145/78 | HR 97 | Temp 98.2°F | Ht 61.0 in | Wt 179.0 lb

## 2019-11-04 DIAGNOSIS — E119 Type 2 diabetes mellitus without complications: Secondary | ICD-10-CM

## 2019-11-04 DIAGNOSIS — N281 Cyst of kidney, acquired: Secondary | ICD-10-CM

## 2019-11-04 DIAGNOSIS — Z23 Encounter for immunization: Secondary | ICD-10-CM | POA: Diagnosis not present

## 2019-11-04 DIAGNOSIS — E1165 Type 2 diabetes mellitus with hyperglycemia: Secondary | ICD-10-CM

## 2019-11-04 DIAGNOSIS — E785 Hyperlipidemia, unspecified: Secondary | ICD-10-CM

## 2019-11-04 DIAGNOSIS — R531 Weakness: Secondary | ICD-10-CM | POA: Diagnosis not present

## 2019-11-04 DIAGNOSIS — E1169 Type 2 diabetes mellitus with other specified complication: Secondary | ICD-10-CM

## 2019-11-04 DIAGNOSIS — N1831 Chronic kidney disease, stage 3a: Secondary | ICD-10-CM | POA: Diagnosis not present

## 2019-11-04 DIAGNOSIS — U071 COVID-19: Secondary | ICD-10-CM

## 2019-11-04 LAB — POCT GLYCOSYLATED HEMOGLOBIN (HGB A1C): HbA1c, POC (controlled diabetic range): 6.5 % (ref 0.0–7.0)

## 2019-11-04 LAB — GLUCOSE, POCT (MANUAL RESULT ENTRY): POC Glucose: 46 mg/dl — AB (ref 70–99)

## 2019-11-04 NOTE — Progress Notes (Signed)
Subjective:  Patient ID: [name] Barker, female    DOB: January 16, 1961  Age: 58 y.o. MRN: [id number]  CC: Hospitalization Follow-up   HPI [name] Barker is a 58 year old female with a history of type 2 diabetes mellitus (A1c 6.5), hypertension, stage III CKD with recent hospitalization for acute respiratory failure secondary to COVID-19 pneumonia at [facility] from 09/27/2019 through 10/02/2019. She was treated with remdesivir and a short course of steroids.  Found to have acute on chronic injury with elevated creatinine of 2.01 up from a baseline 1.2. Renal ultrasound reveals mildly complicated renal cyst.  She did not require oxygen during her hospital course.  Her condition improved and she was subsequently discharged.  She presents today reporting doing on and denies dyspnea, chest pain or wheezing however her extremities feel weak and this has been present since discharge. Her A1c is 6.5 which is improved from 8.7 previously and her lowest random sugar has been 191.  She endorses some numbness in her fingers but states this is mild.  Past Medical History:  Diagnosis Date  . Cataract 01/2018   BL - Dr. Gershon Crane  . Diabetes mellitus Dx 1998  . High cholesterol   . Hip pain   . Other and unspecified hyperlipidemia   . Personal history of unspecified circulatory disease   . S/P cardiac cath June 2008   abnormal cardiolite. LVH..question on prior studies and question septal hypertroph...not seen echo... EF 65%    Past Surgical History:  Procedure Laterality Date  . CHOLECYSTECTOMY      Family History  Problem Relation Age of Onset  . Hypertension Mother   . Hypertension Father   . Heart disease Father   . Stroke Other   . Diabetes Other   . Arthritis Other   . Hypertension Sister   . Hypertension Brother   . Colon cancer Neg Hx     No Known Allergies  Outpatient Medications Prior to Visit  Medication Sig Dispense Refill  . acetaminophen (TYLENOL) 325 MG tablet  Take 2 tablets (650 mg total) by mouth every 6 (six) hours as needed for mild pain or headache.    Marland Kitchen aspirin EC 81 MG tablet Take 1 tablet (81 mg total) by mouth daily. 90 tablet 6  . Blood Glucose Calibration (ACCU-CHEK SMARTVIEW CONTROL) LIQD Use as directed 1 each 2  . Blood Glucose Monitoring Suppl (ACCU-CHEK NANO SMARTVIEW) w/Device KIT Use as directed for once daily testing of blood sugar. E11.9 1 kit 0  . glimepiride (AMARYL) 2 MG tablet Take 1 tablet (2 mg total) by mouth daily before breakfast. 90 tablet 1  . losartan (COZAAR) 50 MG tablet TAKE 1 TABLET ONE TIME DAILY  (STOP LISINOPRIL) (Patient taking differently: Take 50 mg by mouth daily. ) 90 tablet 3  . metFORMIN (GLUCOPHAGE) 1000 MG tablet Take 1 tablet (1,000 mg total) by mouth 2 (two) times daily with a meal. 180 tablet 3  . Multiple Vitamin (MULTIVITAMIN WITH MINERALS) TABS tablet Take 1 tablet by mouth daily. 100 tablet 0  . Omega-3 Fatty Acids (FISH OIL) 1000 MG CAPS Take 1 capsule (1,000 mg total) by mouth daily. 100 capsule 1  . simvastatin (ZOCOR) 20 MG tablet Take 1 tablet (20 mg total) by mouth at bedtime. 90 tablet 3  . tetrahydrozoline 0.05 % ophthalmic solution Place 2 drops into both eyes daily as needed (for dry eyes).    . venlafaxine XR (EFFEXOR-XR) 75 MG 24 hr capsule Take 1 capsule (75 mg total)  by mouth daily with breakfast. 90 capsule 3  . ketoconazole (NIZORAL) 2 % cream Apply to affected area daily (Patient not taking: Reported on 11/04/2019) 30 g 0   No facility-administered medications prior to visit.      ROS Review of Systems  Constitutional: Negative for activity change, appetite change and fatigue.  HENT: Negative for congestion, sinus pressure and sore throat.   Eyes: Negative for visual disturbance.  Respiratory: Negative for cough, chest tightness, shortness of breath and wheezing.   Cardiovascular: Negative for chest pain and palpitations.  Gastrointestinal: Negative for abdominal distention,  abdominal pain and constipation.  Endocrine: Negative for polydipsia.  Genitourinary: Negative for dysuria and frequency.  Musculoskeletal: Negative for arthralgias and back pain.  Skin: Negative for rash.  Neurological: Positive for numbness. Negative for tremors and light-headedness.  Hematological: Does not bruise/bleed easily.  Psychiatric/Behavioral: Negative for agitation and behavioral problems.    Objective:  BP (!) 145/78   Pulse 97   Temp 98.2 F (36.8 C) (Oral)   Ht '5\' 1"'  (1.549 m)   Wt 179 lb (81.2 kg)   LMP 02/21/2016   SpO2 99%   BMI 33.82 kg/m   BP/Weight 11/04/2019 10/02/2019 02/28/3337  Systolic BP 329 191 -  Diastolic BP 78 89 -  Wt. (Lbs) 179 - 182  BMI 33.82 - 34.39      Physical Exam Constitutional:      Appearance: She is well-developed.  Neck:     Vascular: No JVD.  Cardiovascular:     Rate and Rhythm: Normal rate.     Heart sounds: Normal heart sounds. No murmur.  Pulmonary:     Effort: Pulmonary effort is normal.     Breath sounds: Normal breath sounds. No wheezing or rales.  Chest:     Chest wall: No tenderness.  Abdominal:     General: Bowel sounds are normal. There is no distension.     Palpations: Abdomen is soft. There is no mass.     Tenderness: There is no abdominal tenderness.  Musculoskeletal: Normal range of motion.     Right lower leg: No edema.     Left lower leg: No edema.  Neurological:     Mental Status: She is alert and oriented to person, place, and time.  Psychiatric:        Mood and Affect: Mood normal.     CMP Latest Ref Rng & Units 10/02/2019 10/01/2019 09/30/2019  Glucose 70 - 99 mg/dL 167(H) 133(H) 91  BUN 6 - 20 mg/dL 29(H) 28(H) 35(H)  Creatinine 0.44 - 1.00 mg/dL 1.35(H) 1.29(H) 1.25(H)  Sodium 135 - 145 mmol/L 139 139 141  Potassium 3.5 - 5.1 mmol/L 4.8 4.6 4.5  Chloride 98 - 111 mmol/L 103 107 109  CO2 22 - 32 mmol/L 22 20(L) 18(L)  Calcium 8.9 - 10.3 mg/dL 9.3 8.8(L) 8.7(L)  Total Protein 6.5 - 8.1  g/dL 6.9 6.6 6.4(L)  Total Bilirubin 0.3 - 1.2 mg/dL 1.0 0.9 0.8  Alkaline Phos 38 - 126 U/L 81 75 74  AST 15 - 41 U/L 13(L) 17 21  ALT 0 - 44 U/L 27 33 43    Lipid Panel     Component Value Date/Time   CHOL 196 06/05/2019 1134   TRIG 174 (H) 06/05/2019 1134   HDL 48 06/05/2019 1134   CHOLHDL 4.1 06/05/2019 1134   CHOLHDL 2.9 09/13/2016 0949   VLDL 23 09/13/2016 0949   LDLCALC 113 (H) 06/05/2019 1134    CBC  Component Value Date/Time   WBC 12.2 (H) 10/02/2019 0335   RBC 3.65 (L) 10/02/2019 0335   HGB 10.3 (L) 10/02/2019 0335   HGB 10.9 (L) 06/05/2019 1134   HCT 31.4 (L) 10/02/2019 0335   HCT 32.2 (L) 06/05/2019 1134   PLT 518 (H) 10/02/2019 0335   PLT 285 06/05/2019 1134   MCV 86.0 10/02/2019 0335   MCV 85 06/05/2019 1134   MCH 28.2 10/02/2019 0335   MCHC 32.8 10/02/2019 0335   RDW 13.5 10/02/2019 0335   RDW 13.3 06/05/2019 1134   LYMPHSABS 1.8 10/02/2019 0335   MONOABS 1.0 10/02/2019 0335   EOSABS 0.2 10/02/2019 0335   BASOSABS 0.0 10/02/2019 0335    Lab Results  Component Value Date   HGBA1C 6.5 11/04/2019    Assessment & Plan:   1. Type 2 diabetes mellitus without complication, without long-term current use of insulin (HCC) Controlled with A1c of 6.5 Continue current regimen Counseled on Diabetic diet, my plate method, 720 minutes of moderate intensity exercise/week Blood sugar logs with fasting goals of 80-120 mg/dl, random of less than 180 and in the event of sugars less than 60 mg/dl or greater than 400 mg/dl encouraged to notify the clinic. Advised on the need for annual eye exams, annual foot exams, Pneumonia vaccine. - Glucose (CBG) - HgB A1c - Complete Metabolic Panel with GFR  2. 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 COVID-19 virus infection Resolved She has improved significantly but does have some extremity weakness which could be  residual symptoms of deconditioning and has been advised to increase her physical activity as tolerated We will repeat labs. - Ferritin - C-reactive protein - D-dimer, quantitative (not at [facility]) - CBC with Differential/Platelet  4. Hyperlipidemia associated with type 2 diabetes mellitus (HCC) Controlled Low-cholesterol diet - Lipid panel  5. Weakness Could be secondary to deconditioning after hospitalization Home exercise regimen as tolerated    No orders of the defined types were placed in this encounter.   Follow-up: Return in about 3 months (around 02/04/2020) for chronic medical conditions with PCP , Dr Wynetta Emery.       Charlott Rakes, MD, FAAFP. [facility] [place], [place]   11/04/2019, 9:39 AM

## 2019-11-05 ENCOUNTER — Encounter: Payer: Self-pay | Admitting: Family Medicine

## 2019-11-05 LAB — CBC WITH DIFFERENTIAL/PLATELET
Basophils Absolute: 0.1 10*3/uL (ref 0.0–0.2)
Basos: 1 %
EOS (ABSOLUTE): 0.5 10*3/uL — ABNORMAL HIGH (ref 0.0–0.4)
Eos: 7 %
Hematocrit: 30.3 % — ABNORMAL LOW (ref 34.0–46.6)
Hemoglobin: 9.8 g/dL — ABNORMAL LOW (ref 11.1–15.9)
Immature Grans (Abs): 0.1 10*3/uL (ref 0.0–0.1)
Immature Granulocytes: 1 %
Lymphocytes Absolute: 2.8 10*3/uL (ref 0.7–3.1)
Lymphs: 38 %
MCH: 28.3 pg (ref 26.6–33.0)
MCHC: 32.3 g/dL (ref 31.5–35.7)
MCV: 88 fL (ref 79–97)
Monocytes Absolute: 0.8 10*3/uL (ref 0.1–0.9)
Monocytes: 10 %
Neutrophils Absolute: 3.2 10*3/uL (ref 1.4–7.0)
Neutrophils: 43 %
Platelets: 375 10*3/uL (ref 150–450)
RBC: 3.46 x10E6/uL — ABNORMAL LOW (ref 3.77–5.28)
RDW: 14.6 % (ref 11.7–15.4)
WBC: 7.4 10*3/uL (ref 3.4–10.8)

## 2019-11-05 LAB — CMP14+EGFR
ALT: 13 IU/L (ref 0–32)
AST: 12 IU/L (ref 0–40)
Albumin/Globulin Ratio: 1.3 (ref 1.2–2.2)
Albumin: 4.1 g/dL (ref 3.8–4.9)
Alkaline Phosphatase: 100 IU/L (ref 39–117)
BUN/Creatinine Ratio: 12 (ref 9–23)
BUN: 14 mg/dL (ref 6–24)
Bilirubin Total: 0.3 mg/dL (ref 0.0–1.2)
CO2: 21 mmol/L (ref 20–29)
Calcium: 9.7 mg/dL (ref 8.7–10.2)
Chloride: 104 mmol/L (ref 96–106)
Creatinine, Ser: 1.2 mg/dL — ABNORMAL HIGH (ref 0.57–1.00)
GFR calc Af Amer: 57 mL/min/{1.73_m2} — ABNORMAL LOW (ref >59)
GFR calc non Af Amer: 50 mL/min/{1.73_m2} — ABNORMAL LOW (ref >59)
Globulin, Total: 3.1 g/dL (ref 1.5–4.5)
Glucose: 52 mg/dL — ABNORMAL LOW (ref 65–99)
Potassium: 4.5 mmol/L (ref 3.5–5.2)
Sodium: 140 mmol/L (ref 134–144)
Total Protein: 7.2 g/dL (ref 6.0–8.5)

## 2019-11-05 LAB — LIPID PANEL
Chol/HDL Ratio: 4 ratio (ref 0.0–4.4)
Cholesterol, Total: 175 mg/dL (ref 100–199)
HDL: 44 mg/dL (ref >39)
LDL Chol Calc (NIH): 102 mg/dL — ABNORMAL HIGH (ref 0–99)
Triglycerides: 164 mg/dL — ABNORMAL HIGH (ref 0–149)
VLDL Cholesterol Cal: 29 mg/dL (ref 5–40)

## 2019-11-05 LAB — FERRITIN: Ferritin: 194 ng/mL — ABNORMAL HIGH (ref 15–150)

## 2019-11-05 LAB — D-DIMER, QUANTITATIVE: D-DIMER: 2.82 mg/L FEU — ABNORMAL HIGH (ref 0.00–0.49)

## 2019-11-05 LAB — C-REACTIVE PROTEIN: CRP: 4 mg/L (ref 0–10)

## 2019-11-06 ENCOUNTER — Telehealth: Payer: Self-pay

## 2019-11-06 NOTE — Telephone Encounter (Signed)
-----   Message from Enobong Newlin, MD sent at 11/05/2019  8:17 AM EST ----- Labs have improved compared to previous labs.  She is anemic and is encouraged to comply with iron rich foods and iron tablets. 

## 2019-11-06 NOTE — Telephone Encounter (Signed)
Patient was called to go over lab results, patient does not have an voicemail set up to leave a message.

## 2019-11-13 ENCOUNTER — Telehealth: Payer: Self-pay

## 2019-11-13 NOTE — Telephone Encounter (Signed)
-----   Message from Charlott Rakes, MD sent at 11/05/2019  8:17 AM EST ----- Labs have improved compared to previous labs.  She is anemic and is encouraged to comply with iron rich foods and iron tablets.

## 2019-11-13 NOTE — Telephone Encounter (Signed)
Patient name and DOB has been verified Patient was informed of lab results. Patient had no questions.  

## 2019-12-10 ENCOUNTER — Other Ambulatory Visit: Payer: Self-pay | Admitting: Internal Medicine

## 2020-02-04 ENCOUNTER — Ambulatory Visit: Payer: Medicare PPO | Attending: Internal Medicine | Admitting: Internal Medicine

## 2020-02-04 ENCOUNTER — Encounter: Payer: Self-pay | Admitting: Internal Medicine

## 2020-02-04 ENCOUNTER — Other Ambulatory Visit: Payer: Self-pay

## 2020-02-04 VITALS — BP 122/74 | HR 97 | Temp 97.5°F | Resp 16 | Ht 61.0 in | Wt 184.4 lb

## 2020-02-04 DIAGNOSIS — D649 Anemia, unspecified: Secondary | ICD-10-CM | POA: Diagnosis not present

## 2020-02-04 DIAGNOSIS — E1169 Type 2 diabetes mellitus with other specified complication: Secondary | ICD-10-CM | POA: Diagnosis not present

## 2020-02-04 DIAGNOSIS — I10 Essential (primary) hypertension: Secondary | ICD-10-CM

## 2020-02-04 DIAGNOSIS — N1831 Chronic kidney disease, stage 3a: Secondary | ICD-10-CM | POA: Diagnosis not present

## 2020-02-04 DIAGNOSIS — E119 Type 2 diabetes mellitus without complications: Secondary | ICD-10-CM | POA: Diagnosis not present

## 2020-02-04 DIAGNOSIS — E11649 Type 2 diabetes mellitus with hypoglycemia without coma: Secondary | ICD-10-CM

## 2020-02-04 DIAGNOSIS — E785 Hyperlipidemia, unspecified: Secondary | ICD-10-CM | POA: Diagnosis not present

## 2020-02-04 DIAGNOSIS — N281 Cyst of kidney, acquired: Secondary | ICD-10-CM

## 2020-02-04 LAB — GLUCOSE, POCT (MANUAL RESULT ENTRY)
POC Glucose: 52 mg/dl — AB (ref 70–99)
POC Glucose: 83 mg/dl (ref 70–99)

## 2020-02-04 LAB — POCT GLYCOSYLATED HEMOGLOBIN (HGB A1C): HbA1c, POC (controlled diabetic range): 6.1 % (ref 0.0–7.0)

## 2020-02-04 MED ORDER — GLIMEPIRIDE 2 MG PO TABS: 1.0000 mg | ORAL_TABLET | Freq: Every day | ORAL | 1 refills | Status: DC

## 2020-02-04 NOTE — Patient Instructions (Signed)
Diabetes Mellitus and Exercise Exercising regularly is important for your overall health, especially when you have diabetes (diabetes mellitus). Exercising is not only about losing weight. It has many other health benefits, such as increasing muscle strength and bone density and reducing body fat and stress. This leads to improved fitness, flexibility, and endurance, all of which result in better overall health. Exercise has additional benefits for people with diabetes, including:  Reducing appetite.  Helping to lower and control blood glucose.  Lowering blood pressure.  Helping to control amounts of fatty substances (lipids) in the blood, such as cholesterol and triglycerides.  Helping the body to respond better to insulin (improving insulin sensitivity).  Reducing how much insulin the body needs.  Decreasing the risk for heart disease by: ? Lowering cholesterol and triglyceride levels. ? Increasing the levels of good cholesterol. ? Lowering blood glucose levels. What is my activity plan? Your health care provider or certified diabetes educator can help you make a plan for the type and frequency of exercise (activity plan) that works for you. Make sure that you:  Do at least 150 minutes of moderate-intensity or vigorous-intensity exercise each week. This could be brisk walking, biking, or water aerobics. ? Do stretching and strength exercises, such as yoga or weightlifting, at least 2 times a week. ? Spread out your activity over at least 3 days of the week.  Get some form of physical activity every day. ? Do not go more than 2 days in a row without some kind of physical activity. ? Avoid being inactive for more than 30 minutes at a time. Take frequent breaks to walk or stretch.  Choose a type of exercise or activity that you enjoy, and set realistic goals.  Start slowly, and gradually increase the intensity of your exercise over time. What do I need to know about managing my  diabetes?   Check your blood glucose before and after exercising. ? If your blood glucose is 240 mg/dL (13.3 mmol/L) or higher before you exercise, check your urine for ketones. If you have ketones in your urine, do not exercise until your blood glucose returns to normal. ? If your blood glucose is 100 mg/dL (5.6 mmol/L) or lower, eat a snack containing 15-20 grams of carbohydrate. Check your blood glucose 15 minutes after the snack to make sure that your level is above 100 mg/dL (5.6 mmol/L) before you start your exercise.  Know the symptoms of low blood glucose (hypoglycemia) and how to treat it. Your risk for hypoglycemia increases during and after exercise. Common symptoms of hypoglycemia can include: ? Hunger. ? Anxiety. ? Sweating and feeling clammy. ? Confusion. ? Dizziness or feeling light-headed. ? Increased heart rate or palpitations. ? Blurry vision. ? Tingling or numbness around the mouth, lips, or tongue. ? Tremors or shakes. ? Irritability.  Keep a rapid-acting carbohydrate snack available before, during, and after exercise to help prevent or treat hypoglycemia.  Avoid injecting insulin into areas of the body that are going to be exercised. For example, avoid injecting insulin into: ? The arms, when playing tennis. ? The legs, when jogging.  Keep records of your exercise habits. Doing this can help you and your health care provider adjust your diabetes management plan as needed. Write down: ? Food that you eat before and after you exercise. ? Blood glucose levels before and after you exercise. ? The type and amount of exercise you have done. ? When your insulin is expected to peak, if you use   insulin. Avoid exercising at times when your insulin is peaking.  When you start a new exercise or activity, work with your health care provider to make sure the activity is safe for you, and to adjust your insulin, medicines, or food intake as needed.  Drink plenty of water while  you exercise to prevent dehydration or heat stroke. Drink enough fluid to keep your urine clear or pale yellow. Summary  Exercising regularly is important for your overall health, especially when you have diabetes (diabetes mellitus).  Exercising has many health benefits, such as increasing muscle strength and bone density and reducing body fat and stress.  Your health care provider or certified diabetes educator can help you make a plan for the type and frequency of exercise (activity plan) that works for you.  When you start a new exercise or activity, work with your health care provider to make sure the activity is safe for you, and to adjust your insulin, medicines, or food intake as needed. This information is not intended to replace advice given to you by your health care provider. Make sure you discuss any questions you have with your health care provider. Document Revised: 07/04/2017 Document Reviewed: 05/21/2016 Elsevier Patient Education  2020 [employer] Inc.  

## 2020-02-04 NOTE — Progress Notes (Signed)
Patient ID: [name] Barker, female    DOB: 12/13/1961  MRN: [id number]  CC: Diabetes and Hypertension   Subjective: [name] Barker is a 59 y.o. female who presents for chronic ds management Her concerns today include:  Patient with history ofDM, HL, HTN, perimenopausal (On Effexor), anemia, CKD stage3, BL complicated renal cyst, COVID pneumonia  Saw Dr. Margarita Rana 10/2019 post hosp for Covid pneumonia.   BL complicated renal cyst/CKD 3: Patient was noted to have bilateral mild complicated appearing cyst on both kidneys on renal ultrasound done during hospitalization in October.  Dr. Margarita Rana had referred her to the nephrologist but she has not received a call or appointment as yet.  She is making good urine.  No blood in the urine.  She is not on any NSAIDs.    Insomnia:  Uses OTC sleep aide.  She thinks it is Melatonin.  Reports good results with it   DIABETES TYPE 2 Last A1C:   Results for orders placed or performed in visit on 02/04/20  POCT glucose (manual entry)  Result Value Ref Range   POC Glucose 52 (A) 70 - 99 mg/dl  POCT glycosylated hemoglobin (Hb A1C)  Result Value Ref Range   Hemoglobin A1C     HbA1c POC (<> result, manual entry)     HbA1c, POC (prediabetic range)     HbA1c, POC (controlled diabetic range) 6.1 0.0 - 7.0 %  POCT glucose (manual entry)  Result Value Ref Range   POC Glucose 83 70 - 99 mg/dl    Med Adherence:  '[x]'  Yes -Metformin and Amaryl   '[]'  No Medication side effects:  '[]'  Yes    '[x]'  No Home Monitoring?  '[x]'  Yes - once a day in afternoon post dinner   '[]'  No Home glucose results range: 144-to 160s with the highest being 210.  BS low today at 52.  She could not feel that her blood sugar was low.  Pt states she did not eat as yet for today because she did not know if labs would be drawn Diet Adherence: '[x]'  Yes    '[]'  No Exercise: '[x]'  Yes -but not as much as before due to weather.  Wgh up 5 lbs since last visit but patient is wearing double layer of  clothing due to cold weather Hypoglycemic episodes?: '[x]'  Yes -today   '[]'  No Numbness of the feet? '[x]'  Yes -occasionally.   '[]'  No Retinopathy hx? '[]'  Yes    '[x]'  No Last eye exam:  Has appt with eye doctor 02/18/2020 Comments:   HYPERTENSION Currently taking: see medication list Med Adherence: '[x]'  Yes    '[]'  No Medication side effects: '[]'  Yes    '[x]'  No Adherence with salt restriction: '[x]'  Yes    '[]'  No Home Monitoring?: '[]'  Yes    '[x]'  No Monitoring Frequency: '[]'  Yes    '[]'  No Home BP results range: '[]'  Yes    '[]'  No SOB? '[]'  Yes    '[x]'  No Chest Pain?: '[]'  Yes    '[x]'  No Leg swelling?: '[]'  Yes    '[x]'  No Headaches?: '[]'  Yes    '[x]'  No Dizziness? '[]'  Yes    '[x]'  No Comments:   HL:  Compliant with and tolerating Zocor Patient Active Problem List   Diagnosis Date Noted  . COVID-19 virus infection 09/28/2019  . Type II diabetes mellitus with renal manifestations ([place]) 09/28/2019  . HTN (hypertension) 09/28/2019  . HLD (hyperlipidemia) 09/28/2019  . 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  Perimenopausal 09/13/2016  . Hyperlipidemia associated with type 2 diabetes mellitus ([place]) 07/22/2014  . Esophageal reflux 07/22/2014  . Diabetes ([place]) 04/22/2014  . Essential hypertension, benign 04/22/2014     Current Outpatient Medications on File Prior to Visit  Medication Sig Dispense Refill  . acetaminophen (TYLENOL) 325 MG tablet Take 2 tablets (650 mg total) by mouth every 6 (six) hours as needed for mild pain or headache.    Marland Kitchen aspirin EC 81 MG tablet Take 1 tablet (81 mg total) by mouth daily. 90 tablet 6  . Blood Glucose Calibration (ACCU-CHEK SMARTVIEW CONTROL) LIQD Use as directed 1 each 2  . Blood Glucose Monitoring Suppl (ACCU-CHEK NANO SMARTVIEW) w/Device KIT Use as directed for once daily testing of blood sugar. E11.9 1 kit 0  . ketoconazole (NIZORAL) 2 % cream Apply to affected area daily (Patient not taking: Reported on 11/04/2019) 30 g 0  . losartan (COZAAR) 50 MG tablet TAKE 1 TABLET  ONE TIME DAILY  (STOP LISINOPRIL) (Patient taking differently: Take 50 mg by mouth daily. ) 90 tablet 3  . metFORMIN (GLUCOPHAGE) 1000 MG tablet Take 1 tablet (1,000 mg total) by mouth 2 (two) times daily with a meal. 180 tablet 3  . Multiple Vitamin (MULTIVITAMIN WITH MINERALS) TABS tablet Take 1 tablet by mouth daily. 100 tablet 0  . Omega-3 Fatty Acids (FISH OIL) 1000 MG CAPS Take 1 capsule (1,000 mg total) by mouth daily. 100 capsule 1  . simvastatin (ZOCOR) 20 MG tablet Take 1 tablet (20 mg total) by mouth at bedtime. 90 tablet 3  . tetrahydrozoline 0.05 % ophthalmic solution Place 2 drops into both eyes daily as needed (for dry eyes).    . venlafaxine XR (EFFEXOR-XR) 75 MG 24 hr capsule Take 1 capsule (75 mg total) by mouth daily with breakfast. 90 capsule 3   No current facility-administered medications on file prior to visit.    No Known Allergies  Social History   Socioeconomic History  . Marital status: Single    Spouse name: Not on file  . Number of children: Not on file  . Years of education: Not on file  . Highest education level: Not on file  Occupational History  . Not on file  Tobacco Use  . Smoking status: Never Smoker  . Smokeless tobacco: Never Used  Substance and Sexual Activity  . Alcohol use: No  . Drug use: No  . Sexual activity: Not on file  Other Topics Concern  . 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    Social Determinants of Health   Financial Resource Strain:   . Difficulty of Paying Living Expenses: Not on file  Food Insecurity:   . Worried About Charity fundraiser in the Last Year: Not on file  . Ran Out of Food in the Last Year: Not on file  Transportation Needs:   . Lack of Transportation (Medical): Not on file  . Lack of Transportation (Non-Medical): Not on file  Physical Activity:   . Days of Exercise per Week: Not on file  . Minutes of Exercise per Session: Not on file    Stress:   . Feeling of Stress : Not on file  Social Connections:   . Frequency of Communication with Friends and Family: Not on file  . Frequency of Social Gatherings with Friends and Family: Not on file  . Attends Religious Services: Not on file  .  Active Member of Clubs or Organizations: Not on file  . Attends Archivist Meetings: Not on file  . Marital Status: Not on file  Intimate Partner Violence:   . Fear of Current or Ex-Partner: Not on file  . Emotionally Abused: Not on file  . Physically Abused: Not on file  . Sexually Abused: Not on file    Family History  Problem Relation Age of Onset  . Hypertension Mother   . Hypertension Father   . Heart disease Father   . Stroke Other   . Diabetes Other   . Arthritis Other   . Hypertension Sister   . Hypertension Brother   . Colon cancer Neg Hx     Past Surgical History:  Procedure Laterality Date  . CHOLECYSTECTOMY      ROS: Review of Systems Negative except as stated above  PHYSICAL EXAM: BP 122/74   Pulse 97   Temp (!) 97.5 F (36.4 C)   Resp 16   Ht '5\' 1"'  (1.549 m)   Wt 184 lb 6.4 oz (83.6 kg)   LMP 02/21/2016   SpO2 97%   BMI 34.84 kg/m   Wt Readings from Last 3 Encounters:  02/04/20 184 lb 6.4 oz (83.6 kg)  11/04/19 179 lb (81.2 kg)  09/27/19 182 lb (82.6 kg)    Physical Exam  General appearance - alert, well appearing, and in no distress Mental status - normal mood, behavior, speech, dress, motor activity, and thought processes Neck - supple, no significant adenopathy Chest - clear to auscultation, no wheezes, rales or rhonchi, symmetric air entry Heart - normal rate, regular rhythm, normal S1, S2, no murmurs, rubs, clicks or gallops Extremities - peripheral pulses normal, no pedal edema, no clubbing or cyanosis Diabetic Foot Exam - Simple   Simple Foot Form Visual Inspection No deformities, no ulcerations, no other skin breakdown bilaterally: Yes Sensation Testing Intact to touch  and monofilament testing bilaterally: Yes Pulse Check Posterior Tibialis and Dorsalis pulse intact bilaterally: Yes Comments      CMP Latest Ref Rng & Units 11/04/2019 10/02/2019 10/01/2019  Glucose 65 - 99 mg/dL 52(L) 167(H) 133(H)  BUN 6 - 24 mg/dL 14 29(H) 28(H)  Creatinine 0.57 - 1.00 mg/dL 1.20(H) 1.35(H) 1.29(H)  Sodium 134 - 144 mmol/L 140 139 139  Potassium 3.5 - 5.2 mmol/L 4.5 4.8 4.6  Chloride 96 - 106 mmol/L 104 103 107  CO2 20 - 29 mmol/L 21 22 20(L)  Calcium 8.7 - 10.2 mg/dL 9.7 9.3 8.8(L)  Total Protein 6.0 - 8.5 g/dL 7.2 6.9 6.6  Total Bilirubin 0.0 - 1.2 mg/dL 0.3 1.0 0.9  Alkaline Phos 39 - 117 IU/L 100 81 75  AST 0 - 40 IU/L 12 13(L) 17  ALT 0 - 32 IU/L 13 27 33   Lipid Panel     Component Value Date/Time   CHOL 175 11/04/2019 1001   TRIG 164 (H) 11/04/2019 1001   HDL 44 11/04/2019 1001   CHOLHDL 4.0 11/04/2019 1001   CHOLHDL 2.9 09/13/2016 0949   VLDL 23 09/13/2016 0949   LDLCALC 102 (H) 11/04/2019 1001    CBC    Component Value Date/Time   WBC 7.4 11/04/2019 1001   WBC 12.2 (H) 10/02/2019 0335   RBC 3.46 (L) 11/04/2019 1001   RBC 3.65 (L) 10/02/2019 0335   HGB 9.8 (L) 11/04/2019 1001   HCT 30.3 (L) 11/04/2019 1001   PLT 375 11/04/2019 1001   MCV 88 11/04/2019 1001   MCH  28.3 11/04/2019 1001   MCH 28.2 10/02/2019 0335   MCHC 32.3 11/04/2019 1001   MCHC 32.8 10/02/2019 0335   RDW 14.6 11/04/2019 1001   LYMPHSABS 2.8 11/04/2019 1001   MONOABS 1.0 10/02/2019 0335   EOSABS 0.5 (H) 11/04/2019 1001   BASOSABS 0.1 11/04/2019 1001    ASSESSMENT AND PLAN:  1. Type 2 diabetes mellitus without complication, without long-term current use of insulin ([place]) 2. Hypoglycemia associated with diabetes [facility]) Patient had a snack in her purse.  She was told to eat that while she was here.  Repeat blood sugar reading 30 minutes later was 82.  Patient has some hypoglycemic unawareness that she was unable to feel that her blood sugar was low so I will have her  decrease Amaryl to 2 mg 1/2 tab.  Continue Metformin at current dose. Discussed and encourage regular exercise - POCT glucose (manual entry) - POCT glycosylated hemoglobin (Hb A1C) - Microalbumin / creatinine urine ratio   3. Essential hypertension At goal.  Continue Cozaar and low-salt diet  4. Hyperlipidemia associated with type 2 diabetes mellitus (HCC) Continue Zocor  5. Stage 3a chronic kidney disease Continue to monitor.  Advised not to take any NSAIDs  6. Complex renal cyst Message sent to referral coordinator to inquire about nephrology appointment  7. Normocytic anemia - CBC - Iron, TIBC and Ferritin Panel    Patient was given the opportunity to ask questions.  Patient verbalized understanding of the plan and was able to repeat key elements of the plan.   Orders Placed This Encounter  Procedures  . Microalbumin / creatinine urine ratio  . CBC  . Iron, TIBC and Ferritin Panel  . POCT glucose (manual entry)  . POCT glycosylated hemoglobin (Hb A1C)  . POCT glucose (manual entry)     Requested Prescriptions   Signed Prescriptions Disp Refills  . glimepiride (AMARYL) 2 MG tablet 90 tablet 1    Sig: Take 0.5 tablets (1 mg total) by mouth daily with breakfast.    Return in about 3 months (around 05/03/2020).  Karle Plumber, MD, FACP

## 2020-02-05 LAB — CBC
Hematocrit: 33.4 % — ABNORMAL LOW (ref 34.0–46.6)
Hemoglobin: 10.8 g/dL — ABNORMAL LOW (ref 11.1–15.9)
MCH: 28 pg (ref 26.6–33.0)
MCHC: 32.3 g/dL (ref 31.5–35.7)
MCV: 87 fL (ref 79–97)
Platelets: 314 10*3/uL (ref 150–450)
RBC: 3.86 x10E6/uL (ref 3.77–5.28)
RDW: 14.4 % (ref 11.7–15.4)
WBC: 6.5 10*3/uL (ref 3.4–10.8)

## 2020-02-05 LAB — MICROALBUMIN / CREATININE URINE RATIO
Creatinine, Urine: 63.6 mg/dL
Microalb/Creat Ratio: 113 mg/g creat — ABNORMAL HIGH (ref 0–29)
Microalbumin, Urine: 72 ug/mL

## 2020-02-05 LAB — IRON,TIBC AND FERRITIN PANEL
Ferritin: 113 ng/mL (ref 15–150)
Iron Saturation: 24 % (ref 15–55)
Iron: 76 ug/dL (ref 27–159)
Total Iron Binding Capacity: 313 ug/dL (ref 250–450)
UIBC: 237 ug/dL (ref 131–425)

## 2020-02-07 ENCOUNTER — Encounter: Payer: Self-pay | Admitting: Internal Medicine

## 2020-02-07 ENCOUNTER — Other Ambulatory Visit: Payer: Self-pay | Admitting: Internal Medicine

## 2020-02-07 ENCOUNTER — Telehealth: Payer: Self-pay | Admitting: Internal Medicine

## 2020-02-07 DIAGNOSIS — N1831 Chronic kidney disease, stage 3a: Secondary | ICD-10-CM

## 2020-02-07 DIAGNOSIS — D638 Anemia in other chronic diseases classified elsewhere: Secondary | ICD-10-CM | POA: Insufficient documentation

## 2020-02-07 NOTE — Telephone Encounter (Signed)
-----   Message from Dionne Bucy sent at 02/05/2020 10:40 AM EST ----- Gm  Dr Laural Benes  I called them they say not yet  still in review    ----- Message ----- From: Marcine Matar, MD Sent: 02/04/2020   6:06 PM EST To: Dionne Bucy  Patient has not received nephrology appointment as yet.  Can you check on this?

## 2020-02-16 ENCOUNTER — Other Ambulatory Visit: Payer: Self-pay

## 2020-02-16 ENCOUNTER — Ambulatory Visit: Payer: Medicare PPO | Attending: Internal Medicine

## 2020-02-16 DIAGNOSIS — N1831 Chronic kidney disease, stage 3a: Secondary | ICD-10-CM

## 2020-02-17 LAB — BASIC METABOLIC PANEL
BUN/Creatinine Ratio: 10 (ref 9–23)
BUN: 14 mg/dL (ref 6–24)
CO2: 21 mmol/L (ref 20–29)
Calcium: 9.9 mg/dL (ref 8.7–10.2)
Chloride: 102 mmol/L (ref 96–106)
Creatinine, Ser: 1.35 mg/dL — ABNORMAL HIGH (ref 0.57–1.00)
GFR calc Af Amer: 50 mL/min/{1.73_m2} — ABNORMAL LOW (ref >59)
GFR calc non Af Amer: 43 mL/min/{1.73_m2} — ABNORMAL LOW (ref >59)
Glucose: 105 mg/dL — ABNORMAL HIGH (ref 65–99)
Potassium: 4.6 mmol/L (ref 3.5–5.2)
Sodium: 139 mmol/L (ref 134–144)

## 2020-02-17 NOTE — Progress Notes (Signed)
Let pt know that her kidney function is not 100% but stable. Continue to avoid taking any OTC pain meds,  Okay to use Tylenol.

## 2020-02-18 ENCOUNTER — Telehealth: Payer: Self-pay

## 2020-02-18 DIAGNOSIS — H40013 Open angle with borderline findings, low risk, bilateral: Secondary | ICD-10-CM | POA: Diagnosis not present

## 2020-02-18 DIAGNOSIS — H52203 Unspecified astigmatism, bilateral: Secondary | ICD-10-CM | POA: Diagnosis not present

## 2020-02-18 DIAGNOSIS — H524 Presbyopia: Secondary | ICD-10-CM | POA: Diagnosis not present

## 2020-02-18 DIAGNOSIS — H5203 Hypermetropia, bilateral: Secondary | ICD-10-CM | POA: Diagnosis not present

## 2020-02-18 DIAGNOSIS — E119 Type 2 diabetes mellitus without complications: Secondary | ICD-10-CM | POA: Diagnosis not present

## 2020-02-18 DIAGNOSIS — H2513 Age-related nuclear cataract, bilateral: Secondary | ICD-10-CM | POA: Diagnosis not present

## 2020-02-18 LAB — HM DIABETES EYE EXAM

## 2020-02-18 NOTE — Telephone Encounter (Signed)
Contacted pt to go over lab results pt is aware of results and doesn't have any questions or concerns 

## 2020-02-25 ENCOUNTER — Other Ambulatory Visit: Payer: Self-pay | Admitting: Internal Medicine

## 2020-02-25 DIAGNOSIS — Z1231 Encounter for screening mammogram for malignant neoplasm of breast: Secondary | ICD-10-CM

## 2020-03-07 ENCOUNTER — Other Ambulatory Visit: Payer: Self-pay

## 2020-03-07 ENCOUNTER — Ambulatory Visit
Admission: RE | Admit: 2020-03-07 | Discharge: 2020-03-07 | Disposition: A | Discharge status: Home / Self Care | Payer: Medicare PPO | Source: Ambulatory Visit | Attending: Internal Medicine | Admitting: Internal Medicine

## 2020-03-07 DIAGNOSIS — Z1231 Encounter for screening mammogram for malignant neoplasm of breast: Secondary | ICD-10-CM

## 2020-03-08 ENCOUNTER — Telehealth: Payer: Self-pay

## 2020-03-08 NOTE — Telephone Encounter (Signed)
Contacted pt to go over MM results pt is aware and doesn't have any questions or concerns  

## 2020-03-16 ENCOUNTER — Other Ambulatory Visit: Payer: Self-pay | Admitting: Internal Medicine

## 2020-03-16 DIAGNOSIS — E119 Type 2 diabetes mellitus without complications: Secondary | ICD-10-CM

## 2020-04-27 ENCOUNTER — Other Ambulatory Visit: Payer: Self-pay | Admitting: Internal Medicine

## 2020-04-27 DIAGNOSIS — N951 Menopausal and female climacteric states: Secondary | ICD-10-CM

## 2020-05-05 ENCOUNTER — Other Ambulatory Visit: Payer: Self-pay

## 2020-05-05 ENCOUNTER — Ambulatory Visit: Payer: Medicare PPO | Attending: Internal Medicine | Admitting: Internal Medicine

## 2020-05-05 DIAGNOSIS — E1169 Type 2 diabetes mellitus with other specified complication: Secondary | ICD-10-CM | POA: Diagnosis not present

## 2020-05-05 DIAGNOSIS — D631 Anemia in chronic kidney disease: Secondary | ICD-10-CM | POA: Diagnosis not present

## 2020-05-05 DIAGNOSIS — E785 Hyperlipidemia, unspecified: Secondary | ICD-10-CM | POA: Diagnosis not present

## 2020-05-05 DIAGNOSIS — E1121 Type 2 diabetes mellitus with diabetic nephropathy: Secondary | ICD-10-CM | POA: Diagnosis not present

## 2020-05-05 DIAGNOSIS — N1831 Chronic kidney disease, stage 3a: Secondary | ICD-10-CM

## 2020-05-05 DIAGNOSIS — N281 Cyst of kidney, acquired: Secondary | ICD-10-CM

## 2020-05-05 DIAGNOSIS — N951 Menopausal and female climacteric states: Secondary | ICD-10-CM | POA: Diagnosis not present

## 2020-05-05 DIAGNOSIS — I10 Essential (primary) hypertension: Secondary | ICD-10-CM | POA: Diagnosis not present

## 2020-05-05 DIAGNOSIS — E1122 Type 2 diabetes mellitus with diabetic chronic kidney disease: Secondary | ICD-10-CM

## 2020-05-05 MED ORDER — SIMVASTATIN 20 MG PO TABS: 20.0000 mg | ORAL_TABLET | Freq: Every day | ORAL | 3 refills | Status: DC

## 2020-05-05 MED ORDER — LOSARTAN POTASSIUM 50 MG PO TABS: ORAL_TABLET | ORAL | 3 refills | Status: DC

## 2020-05-05 MED ORDER — VENLAFAXINE HCL ER 75 MG PO CP24: 75.0000 mg | ORAL_CAPSULE | Freq: Every day | ORAL | 2 refills | Status: DC

## 2020-05-05 NOTE — Progress Notes (Signed)
Virtual Visit via Telephone Note Due to current restrictions/limitations of in-office visits due to the COVID-19 pandemic, this scheduled clinical appointment was converted to a telehealth visit  I connected with [name] Barker on 05/05/20 at 10:37 a.m by telephone and verified that I am speaking with the correct person using two identifiers. I am in my office.  The patient is at home.  Only the patient and myself participated in this encounter.  I discussed the limitations, risks, security and privacy concerns of performing an evaluation and management service by telephone and the availability of in person appointments. I also discussed with the patient that there may be a patient responsible charge related to this service. The patient expressed understanding and agreed to proceed.   History of Present Illness: Patient with history ofDM, HL, HTN, perimenopausal(On Effexor), anemia, CKD stage3, BL complicated renal cyst, COVID pneumonia.  Last visit 02/04/20. Purpose is chronic ds management  Needing some RFs.  DIABETES TYPE 2 Last A1C:   Lab Results  Component Value Date   HGBA1C 6.1 02/04/2020  BS today was 156  Med Adherence:  '[x]'  Yes.  Compliant with Metformin and Amaryl.    '[]'  No Medication side effects:  '[]'  Yes    '[x]'  No Home Monitoring?  '[x]'  Yes - once a day in mornings   Home glucose results range: 140-150 Diet Adherence: '[x]'  Yes  -eats 3 meals a day and gets in fruits and veggies Exercise: '[x]'  Yes    '[]'  No Hypoglycemic episodes?: '[]'  Yes    '[x]'  No Numbness of the feet? '[]'  Yes    '[x]'  No Retinopathy hx? '[]'  Yes    '[]'  No Last eye exam: 02/18/2020 Comments  HYPERTENSION Currently taking: see medication list Med Adherence: '[x]'  Yes    '[]'  No Medication side effects: '[]'  Yes    '[x]'  No Adherence with salt restriction: '[x]'  Yes    '[]'  No Home Monitoring?: '[]'  Yes    '[x]'  No - does not have a device Monitoring Frequency: '[]'  Yes    '[]'  No Home BP results range: '[]'  Yes    '[]'  No SOB? '[]'   Yes    '[x]'  No Chest Pain?: '[]'  Yes    '[x]'  No Leg swelling?: '[]'  Yes    '[x]'  No Headaches?: '[]'  Yes    '[x]'  No Dizziness? '[]'  Yes    '[x]'  No Comments:   HL:  Taking and tolerating Simvastatin  CKD3/Complicated BL renal cyst:  Pt states she still has not received call for appt from nephrologist -iron studies done on last visit c/w ACD  HM:  Received Covid-19 Pfizer vaccine - 3/18 and 04/05/2020   Outpatient Encounter Medications as of 05/05/2020  Medication Sig  . acetaminophen (TYLENOL) 325 MG tablet Take 2 tablets (650 mg total) by mouth every 6 (six) hours as needed for mild pain or headache.  Marland Kitchen aspirin EC 81 MG tablet Take 1 tablet (81 mg total) by mouth daily.  . Blood Glucose Calibration (ACCU-CHEK SMARTVIEW CONTROL) LIQD Use as directed  . Blood Glucose Monitoring Suppl (ACCU-CHEK NANO SMARTVIEW) w/Device KIT Use as directed for once daily testing of blood sugar. E11.9  . glimepiride (AMARYL) 2 MG tablet Take 0.5 tablets (1 mg total) by mouth daily with breakfast.  . ketoconazole (NIZORAL) 2 % cream Apply to affected area daily (Patient not taking: Reported on 11/04/2019)  . losartan (COZAAR) 50 MG tablet TAKE 1 TABLET ONE TIME DAILY  (STOP LISINOPRIL) (Patient taking differently: Take 50 mg by mouth daily. )  .  metFORMIN (GLUCOPHAGE) 1000 MG tablet TAKE 1 TABLET (1,000 MG TOTAL) BY MOUTH 2 (TWO) TIMES DAILY WITH A MEAL.  . Multiple Vitamin (MULTIVITAMIN WITH MINERALS) TABS tablet Take 1 tablet by mouth daily.  . Omega-3 Fatty Acids (FISH OIL) 1000 MG CAPS Take 1 capsule (1,000 mg total) by mouth daily.  . simvastatin (ZOCOR) 20 MG tablet Take 1 tablet (20 mg total) by mouth at bedtime.  Marland Kitchen tetrahydrozoline 0.05 % ophthalmic solution Place 2 drops into both eyes daily as needed (for dry eyes).  . venlafaxine XR (EFFEXOR-XR) 75 MG 24 hr capsule TAKE 1 CAPSULE (75 MG TOTAL) BY MOUTH DAILY WITH BREAKFAST.   No facility-administered encounter medications on file as of 05/05/2020.       Observations/Objective: Results for orders placed or performed in visit on 28/00/34  Basic Metabolic Panel  Result Value Ref Range   Glucose 105 (H) 65 - 99 mg/dL   BUN 14 6 - 24 mg/dL   Creatinine, Ser 1.35 (H) 0.57 - 1.00 mg/dL   GFR calc non Af Amer 43 (L) >59 mL/min/1.73   GFR calc Af Amer 50 (L) >59 mL/min/1.73   BUN/Creatinine Ratio 10 9 - 23   Sodium 139 134 - 144 mmol/L   Potassium 4.6 3.5 - 5.2 mmol/L   Chloride 102 96 - 106 mmol/L   CO2 21 20 - 29 mmol/L   Calcium 9.9 8.7 - 10.2 mg/dL     Assessment and Plan: 1. Type 2 diabetes mellitus with stage 3a chronic kidney disease, without long-term current use of insulin (HCC) Continue current medications healthy eating habits and regular exercise. - Ambulatory referral to Nephrology  2. Essential hypertension No device to check blood pressure but blood pressure reading on last visit was good.  Continue Cozaar and low-salt diet - losartan (COZAAR) 50 MG tablet; TAKE 1 TABLET ONE TIME DAILY  Dispense: 90 tablet; Refill: 3  3. Hyperlipidemia associated with type 2 diabetes mellitus (HCC) - simvastatin (ZOCOR) 20 MG tablet; Take 1 tablet (20 mg total) by mouth at bedtime.  Dispense: 90 tablet; Refill: 3  4. Anemia in stage 3a chronic kidney disease Stable.  We'll continue to monitor  5. Perimenopausal - venlafaxine XR (EFFEXOR-XR) 75 MG 24 hr capsule; Take 1 capsule (75 mg total) by mouth daily with breakfast.  Dispense: 90 capsule; Refill: 2  6. Renal cyst I will resubmit referral to nephrology - Ambulatory referral to Nephrology   Follow Up Instructions: 7 wks for Medicare Wellness    I discussed the assessment and treatment plan with the patient. The patient was provided an opportunity to ask questions and all were answered. The patient agreed with the plan and demonstrated an understanding of the instructions.   The patient was advised to call back or seek an in-person evaluation if the symptoms worsen or if the  condition fails to improve as anticipated.  I provided 12 minutes of non-face-to-face time during this encounter.   Karle Plumber, MD

## 2020-05-17 DIAGNOSIS — H40013 Open angle with borderline findings, low risk, bilateral: Secondary | ICD-10-CM | POA: Diagnosis not present

## 2020-06-20 ENCOUNTER — Other Ambulatory Visit: Payer: Self-pay | Admitting: Internal Medicine

## 2020-06-20 DIAGNOSIS — E119 Type 2 diabetes mellitus without complications: Secondary | ICD-10-CM

## 2020-06-23 ENCOUNTER — Other Ambulatory Visit: Payer: Self-pay

## 2020-06-23 ENCOUNTER — Ambulatory Visit: Payer: Medicare PPO | Attending: Internal Medicine | Admitting: Internal Medicine

## 2020-06-23 ENCOUNTER — Encounter: Payer: Self-pay | Admitting: Internal Medicine

## 2020-06-23 VITALS — BP 129/86 | HR 97 | Temp 96.4°F | Resp 16 | Ht 62.0 in | Wt 185.0 lb

## 2020-06-23 DIAGNOSIS — Z6833 Body mass index (BMI) 33.0-33.9, adult: Secondary | ICD-10-CM

## 2020-06-23 DIAGNOSIS — Z23 Encounter for immunization: Secondary | ICD-10-CM | POA: Diagnosis not present

## 2020-06-23 DIAGNOSIS — Z0000 Encounter for general adult medical examination without abnormal findings: Secondary | ICD-10-CM

## 2020-06-23 DIAGNOSIS — E6609 Other obesity due to excess calories: Secondary | ICD-10-CM

## 2020-06-23 DIAGNOSIS — Z9189 Other specified personal risk factors, not elsewhere classified: Secondary | ICD-10-CM | POA: Diagnosis not present

## 2020-06-23 NOTE — Progress Notes (Signed)
Subjective:   [name] Barker is a 59 y.o. female who presents for an Initial Medicare Annual Wellness Visit. Patient with history ofDM, HL, HTN, perimenopausal(On Effexor), anemia, CKD stage3, BL complicated renal cyst, COVID pneumonia.  Review of Systems    Pt voices no new concerns today    Objective:    Today's Vitals   06/23/20 0845  BP: 129/86  Pulse: 97  Resp: 16  Temp: (!) 96.4 F (35.8 C)  SpO2: 97%  Weight: 185 lb (83.9 kg)  Height: '5\' 2"'  (1.575 m)    Wt Readings from Last 3 Encounters:  06/23/20 185 lb (83.9 kg)  02/04/20 184 lb 6.4 oz (83.6 kg)  11/04/19 179 lb (81.2 kg)    Body mass index is 33.84 kg/m.  Advanced Directives 06/23/2020 09/28/2019 09/27/2019 10/17/2017 09/03/2017 08/22/2017 05/09/2017  Does Patient Have a Medical Advance Directive? No No No No No No No  Type of Advance Directive - - - - - - -  Copy of Healthcare Power of Attorney in Chart? - - - - - - -  Would patient like information on creating a medical advance directive? Yes (Inpatient - patient defers creating a medical advance directive at this time - Information given) No - Patient declined Yes (ED - Information included in AVS) - No - Patient declined No - Patient declined -  Discussed with patient what is an advanced directive.  I went into details explaining the living will and healthcare power of attorney.  Patient is interested in executing a living will.  I have given her a packet of information and the packet includes form that she can complete and have it notarized to execute the living will.  Informed her to bring a copy for our records when she has completed it.  Current Medications (verified) Outpatient Encounter Medications as of 06/23/2020  Medication Sig  . acetaminophen (TYLENOL) 325 MG tablet Take 2 tablets (650 mg total) by mouth every 6 (six) hours as needed for mild pain or headache.  . ASPIRIN LOW DOSE 81 MG EC tablet TAKE 1 TABLET (81 MG TOTAL) BY MOUTH DAILY.  Marland Kitchen Blood  Glucose Calibration (ACCU-CHEK SMARTVIEW CONTROL) LIQD Use as directed  . Blood Glucose Monitoring Suppl (ACCU-CHEK NANO SMARTVIEW) w/Device KIT Use as directed for once daily testing of blood sugar. E11.9  . glimepiride (AMARYL) 2 MG tablet Take 0.5 tablets (1 mg total) by mouth daily with breakfast.  . losartan (COZAAR) 50 MG tablet TAKE 1 TABLET ONE TIME DAILY  . metFORMIN (GLUCOPHAGE) 1000 MG tablet TAKE 1 TABLET (1,000 MG TOTAL) BY MOUTH 2 (TWO) TIMES DAILY WITH A MEAL.  . Multiple Vitamin (MULTIVITAMIN WITH MINERALS) TABS tablet Take 1 tablet by mouth daily.  . Omega-3 Fatty Acids (FISH OIL) 1000 MG CAPS Take 1 capsule (1,000 mg total) by mouth daily.  . simvastatin (ZOCOR) 20 MG tablet Take 1 tablet (20 mg total) by mouth at bedtime.  Marland Kitchen tetrahydrozoline 0.05 % ophthalmic solution Place 2 drops into both eyes daily as needed (for dry eyes).  . venlafaxine XR (EFFEXOR-XR) 75 MG 24 hr capsule Take 1 capsule (75 mg total) by mouth daily with breakfast.   No facility-administered encounter medications on file as of 06/23/2020.    Allergies (verified) Patient has no known allergies.   History: Past Medical History:  Diagnosis Date  . Cataract 01/2018   BL - Dr. Gershon Crane  . Diabetes mellitus Dx 1998  . High cholesterol   . Hip pain   .  Other and unspecified hyperlipidemia   . Personal history of unspecified circulatory disease   . S/P cardiac cath June 2008   abnormal cardiolite. LVH..question on prior studies and question septal hypertroph...not seen echo... EF 65%   Past Surgical History:  Procedure Laterality Date  . CHOLECYSTECTOMY     Family History  Problem Relation Age of Onset  . Hypertension Mother   . Hypertension Father   . Heart disease Father   . Stroke Other   . Diabetes Other   . Arthritis Other   . Hypertension Sister   . Hypertension Brother   . Colon cancer Neg Hx    Social History   Socioeconomic History  . Marital status: Single    Spouse name: Not  on file  . Number of children: Not on file  . Years of education: Not on file  . Highest education level: Not on file  Occupational History  . Not on file  Tobacco Use  . Smoking status: Never Smoker  . Smokeless tobacco: Never Used  Substance and Sexual Activity  . Alcohol use: No  . Drug use: No  . Sexual activity: Not on file  Other Topics Concern  . 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    Social Determinants of Health   Financial Resource Strain:   . Difficulty of Paying Living Expenses:   Food Insecurity:   . Worried About Charity fundraiser in the Last Year:   . Arboriculturist in the Last Year:   Transportation Needs:   . Film/video editor (Medical):   Marland Kitchen Lack of Transportation (Non-Medical):   Physical Activity:   . Days of Exercise per Week:   . Minutes of Exercise per Session:   Stress:   . Feeling of Stress :   Social Connections:   . Frequency of Communication with Friends and Family:   . Frequency of Social Gatherings with Friends and Family:   . Attends Religious Services:   . Active Member of Clubs or Organizations:   . Attends Archivist Meetings:   Marland Kitchen Marital Status:     Tobacco Counseling Never smoked  Clinical Intake: Pain : No/denies pain  Diabetes: Yes CBG done?: No  Diabetic?  Yes.  Last A1c was 6.1 in February of this year.  Compliant with Amaryl and Metformin  Activities of Daily Living In your present state of health, do you have any difficulty performing the following activities: 06/23/2020 09/28/2019  Hearing? N -  Vision? Y -  Difficulty concentrating or making decisions? N -  Walking or climbing stairs? N -  Dressing or bathing? N -  Doing errands, shopping? N N  Preparing Food and eating ? N -  Using the Toilet? N -  In the past six months, have you accidently leaked urine? N -  Do you have problems with loss of bowel control? N -  Managing your  Medications? N -  Managing your Finances? N -  Housekeeping or managing your Housekeeping? N -  Some recent data might be hidden  Vision: had eye exam 01/2020 and 05/17/20 Dr. Gershon Crane [facility].  Wears glasses.  Told she has cataracts but not significant enough to warrant surgery  Patient Care Team: Ladell Pier, MD as PCP - General (Internal Medicine) Dr. Reginold [facility]  Indicate any recent Medical Services you may have received from other  than [facility] providers in the past year (date may be approximate). Sees Dr. Gershon Crane for vision eval.    Assessment:   This is a routine wellness examination for [place].  Hearing/Vision screen: Whisper test is abnormal.   Hearing Screening   '125Hz'  '250Hz'  '500Hz'  '1000Hz'  '2000Hz'  '3000Hz'  '4000Hz'  '6000Hz'  '8000Hz'   Right ear:           Left ear:             Visual Acuity Screening   Right eye Left eye Both eyes  Without correction:     With correction: '20 30 20 30 20 30  ' Sees Dr. Gershon Crane for vision eval.  Wears glasses  Dietary issues and exercise activities discussed: -doing okay with eating habits.  Eats 3 solid meals a day.  Avoids junk foods.  Drinks water, occasional OJ and diet sodas. Lately has to buy burgers, frozen fish sticks and pork chops due to increase cost of foods.  Does well with eating fresh veggies and fruits.  Walks a little every day. Dietary counseling given.  I advised her to eliminate sugary drinks like juices, regular sodas and sweet tea.  Try to eat more lean white meat like chicken, [place] and seafood instead of beef or pork.  Try to decrease portion sizes of white carbohydrates.  Incorporate fresh fruits and vegetables into the diet with at least 3 servings a day of fresh fruits.  Goals    . Blood Pressure < 140/90    . HEMOGLOBIN A1C < 7.0    . Weight (lb) < 200 lb (90.7 kg)     "I want to continue losing weight."      Depression Screen PHQ 2/9 Scores 06/23/2020 05/05/2020 03/03/2019 11/28/2018 08/29/2018  05/26/2018 02/20/2018  PHQ - 2 Score 0 0 0 0 0 0 3  PHQ- 9 Score - - - - - - 8    Fall Risk Fall Risk  06/23/2020 11/04/2019 08/29/2018 02/20/2018 01/13/2018  Falls in the past year? 0 0 No No No    Any stairs in or around the home? No   If so, are there any without handrails? NA Home free of loose throw rugs in walkways, pet beds, electrical cords, etc? Yes   Adequate lighting in your home to reduce risk of falls? Yes    ASSISTIVE DEVICES UTILIZED TO PREVENT FALLS:  Life alert? No  And does not feel she needs one at this time. Use of a cane, walker or w/c? No   Grab bars in the bathroom? No  no Shower chair or bench in shower? Yes  Elevated toilet seat or a handicapped toilet? No And does not feel that she needs one at this time.  TIMED UP AND GO:  Was the test performed? Yes .  Length of time to ambulate 10 feet: 10 sec.   Gait slow and steady without use of assistive device  Cognitive Function: MMSE - Mini Mental State Exam 06/23/2020  Orientation to time 5  Orientation to Place 5  Registration 3  Attention/ Calculation 4  Recall 1  Language- name 2 objects 2  Language- repeat 1  Language- follow 3 step command 3  Language- read & follow direction 1  Write a sentence 1  Copy design 1  Total score 27        Immunizations Immunization History  Administered Date(s) Administered  . Influenza,inj,Quad PF,6+ Mos 10/20/2014, 09/13/2016, 10/22/2017, 08/29/2018, 11/04/2019  . PFIZER SARS-COV-2 Vaccination 03/10/2020, 04/05/2020  . Pneumococcal Polysaccharide-23 11/08/2016  .  Tdap 12/20/2016  . Zoster Recombinat (Shingrix) 06/23/2020    TDAP status: Up to date Flu Vaccine status: Up to date Pneumococcal vaccine status: Up to date Covid-19 vaccine status: Completed vaccines  Qualifies for Shingles Vaccine? Yes   Zostavax completed No  First shot given today.  She will be due for the second shot in 2 to 6 months.  We will give this on a subsequent visit.  Screening  Tests Health Maintenance  Topic Date Due  . INFLUENZA VACCINE  07/24/2020  . HEMOGLOBIN A1C  08/03/2020  . FOOT EXAM  02/03/2021  . OPHTHALMOLOGY EXAM  02/17/2021  . PAP SMEAR-Modifier  02/27/2021  . MAMMOGRAM  03/07/2022  . COLONOSCOPY  03/23/2024  . TETANUS/TDAP  12/20/2026  . PNEUMOCOCCAL POLYSACCHARIDE VACCINE AGE 72-64 HIGH RISK  Completed  . COVID-19 Vaccine  Completed  . Hepatitis C Screening  Completed  . HIV Screening  Completed    Health Maintenance She is up-to-date with age-appropriate health maintenance including colon cancer screening, mammogram and Pap smear.  She is not a candidate for bone density study at this time  Lung Cancer Screening: (Low Dose CT Chest recommended if Age 20-80 years, 30 pack-year currently smoking OR have quit w/in 15years.) does not qualify.   Lung Cancer Screening Referral: NA  Additional Screening:  Hepatitis C Screening: does qualify; Completed yes  Vision Screening: Recommended annual ophthalmology exams for early detection of glaucoma and other disorders of the eye. Is the patient up to date with their annual eye exam?  Yes  Who is the provider or what is the name of the office in which the patient attends annual eye exams? Dr. Gershon Crane If pt is not established with a provider, would they like to be referred to a provider to establish care? NA.   Dental Screening: Recommended annual dental exams for proper oral hygiene.  She is overdue for routine dental visit.  She has not seen a dentist in many years.  She wear partials above and below.  A partial below has a chipped tooth.  She is agreeable to referral to the dentist  Community Resource Referral / Chronic Care Management: CRR required this visit?   No CCM required this visit?  No      Plan:    1. Encounter for Medicare annual wellness exam 2. Need for shingles vaccine First shot given today of the Shingrix.  We will give the second shot on her subsequent visit in 3 months. 3.  Class 1 obesity due to excess calories with serious comorbidity and body mass index (BMI) of 33.0 to 33.9 in adult Dietary counseling given as discussed above.  Encouraged her to continue regular exercise with goal of getting in about 150 minutes total per week of moderate intensity exercise  4. Need for dental care - Ambulatory referral to Dentistry   I have personally reviewed and noted the following in the patient's chart:   . Medical and social history . Use of alcohol, tobacco or illicit drugs  . Current medications and supplements . Functional ability and status . Nutritional status . Physical activity . Advanced directives . List of other physicians . Hospitalizations, surgeries, and ER visits in previous 12 months . Vitals . Screenings to include cognitive, depression, and falls . Referrals and appointments  In addition, I have reviewed and discussed with patient certain preventive protocols, quality metrics, and best practice recommendations. A written personalized care plan for preventive services as well as general preventive health recommendations were  provided to patient.     Karle Plumber, MD   06/23/2020

## 2020-06-23 NOTE — Patient Instructions (Addendum)
SHe will given the shingles vaccine today.  You will need the second shot in 2 to 6 months.  We will plan to give it to you on your next routine follow-up visit.  Please read over the material that was given to you at the advanced directive.  If you decide to execute one, please keep a copy for your records and bring a copy for our records on your next visit.  Obesity, Adult Obesity is having too much body fat. Being obese means that your weight is more than what is healthy for you. BMI is a number that explains how much body fat you have. If you have a BMI of 30 or more, you are obese. Obesity is often caused by eating or drinking more calories than your body uses. Changing your lifestyle can help you lose weight. Obesity can cause serious health problems, such as:  Stroke.  Coronary artery disease (CAD).  Type 2 diabetes.  Some types of cancer, including cancers of the colon, breast, uterus, and gallbladder.  Osteoarthritis.  High blood pressure (hypertension).  High cholesterol.  Sleep apnea.  Gallbladder stones.  Infertility problems. What are the causes?  Eating meals each day that are high in calories, sugar, and fat.  Being born with genes that may make you more likely to become obese.  Having a medical condition that causes obesity.  Taking certain medicines.  Sitting a lot (having a sedentary lifestyle).  Not getting enough sleep.  Drinking a lot of drinks that have sugar in them. What increases the risk?  Having a family history of obesity.  Being an [place] American woman.  Being a Hispanic man.  Living in an area with limited access to: ? [place] [facility], recreation centers, or sidewalks. ? Healthy food choices, such as grocery stores and farmers' markets. What are the signs or symptoms? The main sign is having too much body fat. How is this treated?  Treatment for this condition often includes changing your lifestyle. Treatment may include: ? Changing  your diet. This may include making a healthy meal plan. ? Exercise. This may include activity that causes your heart to beat faster (aerobic exercise) and strength training. Work with your doctor to design a program that works for you. ? Medicine to help you lose weight. This may be used if you are not able to lose 1 pound a week after 6 weeks of healthy eating and more exercise. ? Treating conditions that cause the obesity. ? Surgery. Options may include gastric banding and gastric bypass. This may be done if:  Other treatments have not helped to improve your condition.  You have a BMI of 40 or higher.  You have life-threatening health problems related to obesity. Follow these instructions at home: Eating and drinking   Follow advice from your doctor about what to eat and drink. Your doctor may tell you to: ? Limit fast food, sweets, and processed snack foods. ? Choose low-fat options. For example, choose low-fat milk instead of whole milk. ? Eat 5 or more servings of fruits or vegetables each day. ? Eat at home more often. This gives you more control over what you eat. ? Choose healthy foods when you eat out. ? Learn to read food labels. This will help you learn how much food is in 1 serving. ? Keep low-fat snacks available. ? Avoid drinks that have a lot of sugar in them. These include soda, fruit juice, iced tea with sugar, and flavored milk.  Drink enough  water to keep your pee (urine) pale yellow.  Do not go on fad diets. Physical activity  Exercise often, as told by your doctor. Most adults should get up to 150 minutes of moderate-intensity exercise every week.Ask your doctor: ? What types of exercise are safe for you. ? How often you should exercise.  Warm up and stretch before being active.  Do slow stretching after being active (cool down).  Rest between times of being active. Lifestyle  Work with your doctor and a food expert (dietitian) to set a weight-loss goal  that is best for you.  Limit your screen time.  Find ways to reward yourself that do not involve food.  Do not drink alcohol if: ? Your doctor tells you not to drink. ? You are pregnant, may be pregnant, or are planning to become pregnant.  If you drink alcohol: ? Limit how much you use to:  0-1 drink a day for women.  0-2 drinks a day for men. ? Be aware of how much alcohol is in your drink. In the [place] one drink equals one 12 oz bottle of beer (355 mL), one 5 oz glass of wine (148 mL), or one 1 oz glass of hard liquor (44 mL). General instructions  Keep a weight-loss journal. This can help you keep track of: ? The food that you eat. ? How much exercise you get.  Take over-the-counter and prescription medicines only as told by your doctor.  Take vitamins and supplements only as told by your doctor.  Think about joining a support group.  Keep all follow-up visits as told by your doctor. This is important. Contact a doctor if:  You cannot meet your weight loss goal after you have changed your diet and lifestyle for 6 weeks. Get help right away if you:  Are having trouble breathing.  Are having thoughts of harming yourself. Summary  Obesity is having too much body fat.  Being obese means that your weight is more than what is healthy for you.  Work with your doctor to set a weight-loss goal.  Get regular exercise as told by your doctor. This information is not intended to replace advice given to you by your health care provider. Make sure you discuss any questions you have with your health care provider. Document Revised: 08/14/2018 Document Reviewed: 08/14/2018 Elsevier Patient Education  2020 Elsevier Inc.   Zoster Vaccine, Recombinant injection What is this medicine? ZOSTER VACCINE (ZOS ter vak SEEN) is used to prevent shingles in adults 60 years old and over. This vaccine is not used to treat shingles or nerve pain from shingles. This medicine may be used  for other purposes; ask your health care provider or pharmacist if you have questions. COMMON BRAND NAME(S): [facility] What should I tell my health care provider before I take this medicine? They need to know if you have any of these conditions:  blood disorders or disease  cancer like leukemia or lymphoma  immune system problems or therapy  an unusual or allergic reaction to vaccines, other medications, foods, dyes, or preservatives  pregnant or trying to get pregnant  breast-feeding How should I use this medicine? This vaccine is for injection in a muscle. It is given by a health care professional. Talk to your pediatrician regarding the use of this medicine in children. This medicine is not approved for use in children. Overdosage: If you think you have taken too much of this medicine contact a poison control center or emergency room  at once. NOTE: This medicine is only for you. Do not share this medicine with others. What if I miss a dose? Keep appointments for follow-up (booster) doses as directed. It is important not to miss your dose. Call your doctor or health care professional if you are unable to keep an appointment. What may interact with this medicine?  medicines that suppress your immune system  medicines to treat cancer  steroid medicines like prednisone or cortisone This list may not describe all possible interactions. Give your health care provider a list of all the medicines, herbs, non-prescription drugs, or dietary supplements you use. Also tell them if you smoke, drink alcohol, or use illegal drugs. Some items may interact with your medicine. What should I watch for while using this medicine? Visit your doctor for regular check ups. This vaccine, like all vaccines, may not fully protect everyone. What side effects may I notice from receiving this medicine? Side effects that you should report to your doctor or health care professional as soon as  possible:  allergic reactions like skin rash, itching or hives, swelling of the face, lips, or tongue  breathing problems Side effects that usually do not require medical attention (report these to your doctor or health care professional if they continue or are bothersome):  chills  headache  fever  nausea, vomiting  redness, warmth, pain, swelling or itching at site where injected  tiredness This list may not describe all possible side effects. Call your doctor for medical advice about side effects. You may report side effects to FDA at 1-800-FDA-1088. Where should I keep my medicine? This vaccine is only given in a clinic, pharmacy, doctor's office, or other health care setting and will not be stored at home. NOTE: This sheet is a summary. It may not cover all possible information. If you have questions about this medicine, talk to your doctor, pharmacist, or health care provider.  2020 Elsevier/Gold Standard (2017-07-22 13:20:30)

## 2020-09-29 ENCOUNTER — Ambulatory Visit: Payer: Medicare PPO | Attending: Internal Medicine | Admitting: Internal Medicine

## 2020-09-29 ENCOUNTER — Encounter: Payer: Self-pay | Admitting: Internal Medicine

## 2020-09-29 ENCOUNTER — Other Ambulatory Visit: Payer: Self-pay

## 2020-09-29 ENCOUNTER — Ambulatory Visit (HOSPITAL_BASED_OUTPATIENT_CLINIC_OR_DEPARTMENT_OTHER): Payer: Medicare PPO | Admitting: Pharmacist

## 2020-09-29 VITALS — BP 132/76 | HR 98 | Resp 16 | Wt 184.2 lb

## 2020-09-29 DIAGNOSIS — E1169 Type 2 diabetes mellitus with other specified complication: Secondary | ICD-10-CM

## 2020-09-29 DIAGNOSIS — N281 Cyst of kidney, acquired: Secondary | ICD-10-CM | POA: Diagnosis not present

## 2020-09-29 DIAGNOSIS — Z23 Encounter for immunization: Secondary | ICD-10-CM | POA: Diagnosis not present

## 2020-09-29 DIAGNOSIS — E785 Hyperlipidemia, unspecified: Secondary | ICD-10-CM

## 2020-09-29 DIAGNOSIS — E1122 Type 2 diabetes mellitus with diabetic chronic kidney disease: Secondary | ICD-10-CM

## 2020-09-29 DIAGNOSIS — E669 Obesity, unspecified: Secondary | ICD-10-CM | POA: Diagnosis not present

## 2020-09-29 DIAGNOSIS — I7300 Raynaud's syndrome without gangrene: Secondary | ICD-10-CM | POA: Diagnosis not present

## 2020-09-29 DIAGNOSIS — I10 Essential (primary) hypertension: Secondary | ICD-10-CM | POA: Diagnosis not present

## 2020-09-29 DIAGNOSIS — D631 Anemia in chronic kidney disease: Secondary | ICD-10-CM

## 2020-09-29 DIAGNOSIS — N1831 Chronic kidney disease, stage 3a: Secondary | ICD-10-CM

## 2020-09-29 LAB — POCT GLYCOSYLATED HEMOGLOBIN (HGB A1C): HbA1c, POC (prediabetic range): 6.4 % (ref 5.7–6.4)

## 2020-09-29 NOTE — Progress Notes (Signed)
Patient presents for vaccination against influenza per orders of Dr. Johnson. Consent given. Counseling provided. No contraindications exists. Vaccine administered without incident.  ° °Luke Van Ausdall, PharmD, CPP °Clinical Pharmacist °[facility] °[phone] ° °

## 2020-09-29 NOTE — Patient Instructions (Signed)

## 2020-09-29 NOTE — Progress Notes (Signed)
Patient ID: [name] Barker, female    DOB: 26-Jun-1961  MRN: [id number]  CC: Hypertension and Diabetes   Subjective: [name] Barker is a 59 y.o. female who presents for chronic ds managment Her concerns today include:  Patient with history ofDM with microalbumin, HL, HTN, perimenopausal(On Effexor), anemia chronic ds, insomnia, CKD stage3, BL complicated renal cyst, COVID pneumonia.  DIABETES TYPE 2 Last A1C:   Results for orders placed or performed in visit on 09/29/20  POCT glycosylated hemoglobin (Hb A1C)  Result Value Ref Range   Hemoglobin A1C     HbA1c POC (<> result, manual entry)     HbA1c, POC (prediabetic range) 6.4 5.7 - 6.4 %   HbA1c, POC (controlled diabetic range)      Med Adherence:  '[x]'  Yes    '[]'  No Medication side effects:  '[]'  Yes    '[x]'  No Home Monitoring?  '[x]'  Yes once a day before dinner Home glucose results range: lowest 76 and highest 170. Diet Adherence: '[x]'  Yes - eats cereal, grits or oatmeal for BF, sandwich for lunch.  Dinner varies.  She tries to incorporate veggies and fruits Exercise: '[x]'  Yes  -walks daily for about 15 minutes/day Hypoglycemic episodes?: '[x]'  Yes -only one time was BS in the 70s Numbness of the feet? '[]'  Yes    '[x]'  No but some chronic intermittent numbness in the 2-4th fingers LT hand.  Episodes last 20 mins. Occurs more in cold temp, tips of fingers turn red,  No pain.  No waking up at nights with numbness or pain in hand Retinopathy hx? '[]'  Yes    '[x]'  No Last eye exam:  Up to date Comments:   HYPERTENSION Currently taking: see medication list Med Adherence: '[x]'  Yes    '[]'  No Medication side effects: '[]'  Yes    '[x]'  No Adherence with salt restriction: '[x]'  Yes    '[]'  No Home Monitoring?: '[]'  Yes    '[x]'  No.  No device at home Monitoring Frequency: '[]'  Yes    '[]'  No Home BP results range: '[]'  Yes    '[]'  No SOB? '[]'  Yes    '[x]'  No Chest Pain?: '[]'  Yes    '[x]'  No Leg swelling?: '[]'  Yes    '[x]'  No Headaches?: '[]'  Yes    '[x]'  No Dizziness? '[]'  Yes     '[x]'  No Comments:   HL: Compliant with and tolerating simvastatin.  CKD3/complicated cyst:  Never received appt with nephrology. Still making good urine. Not taking any NSAIDs. CBC with iron studies checked on last visit. She has anemia of chronic disease. She also has microalbuminurea  Patient Active Problem List   Diagnosis Date Noted  . Anemia, chronic disease 02/07/2020  . COVID-19 virus infection 09/28/2019  . Type II diabetes mellitus with renal manifestations ([place]) 09/28/2019  . HTN (hypertension) 09/28/2019  . HLD (hyperlipidemia) 09/28/2019  . 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 Perimenopausal 09/13/2016  . Hyperlipidemia associated with type 2 diabetes mellitus ([place]) 07/22/2014  . Esophageal reflux 07/22/2014  . Diabetes ([place]) 04/22/2014  . Essential hypertension, benign 04/22/2014     Current Outpatient Medications on File Prior to Visit  Medication Sig Dispense Refill  . acetaminophen (TYLENOL) 325 MG tablet Take 2 tablets (650 mg total) by mouth every 6 (six) hours as needed for mild pain or headache.    . ASPIRIN LOW DOSE 81 MG EC tablet TAKE 1 TABLET (81 MG TOTAL) BY MOUTH DAILY. 90 tablet 6  . Blood Glucose Calibration (  ACCU-CHEK SMARTVIEW CONTROL) LIQD Use as directed 1 each 2  . Blood Glucose Monitoring Suppl (ACCU-CHEK NANO SMARTVIEW) w/Device KIT Use as directed for once daily testing of blood sugar. E11.9 1 kit 0  . glimepiride (AMARYL) 2 MG tablet Take 0.5 tablets (1 mg total) by mouth daily with breakfast. 90 tablet 1  . losartan (COZAAR) 50 MG tablet TAKE 1 TABLET ONE TIME DAILY 90 tablet 3  . metFORMIN (GLUCOPHAGE) 1000 MG tablet TAKE 1 TABLET (1,000 MG TOTAL) BY MOUTH 2 (TWO) TIMES DAILY WITH A MEAL. 180 tablet 3  . Multiple Vitamin (MULTIVITAMIN WITH MINERALS) TABS tablet Take 1 tablet by mouth daily. 100 tablet 0  . Omega-3 Fatty Acids (FISH OIL) 1000 MG CAPS Take 1 capsule (1,000 mg total) by mouth daily. 100 capsule 1  . simvastatin (ZOCOR)  20 MG tablet Take 1 tablet (20 mg total) by mouth at bedtime. 90 tablet 3  . tetrahydrozoline 0.05 % ophthalmic solution Place 2 drops into both eyes daily as needed (for dry eyes).    . venlafaxine XR (EFFEXOR-XR) 75 MG 24 hr capsule Take 1 capsule (75 mg total) by mouth daily with breakfast. 90 capsule 2   No current facility-administered medications on file prior to visit.    No Known Allergies  Social History   Socioeconomic History  . Marital status: Single    Spouse name: Not on file  . Number of children: Not on file  . Years of education: Not on file  . Highest education level: Not on file  Occupational History  . Not on file  Tobacco Use  . Smoking status: Never Smoker  . Smokeless tobacco: Never Used  Substance and Sexual Activity  . Alcohol use: No  . Drug use: No  . Sexual activity: Not on file  Other Topics Concern  . 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    Social Determinants of Health   Financial Resource Strain:   . Difficulty of Paying Living Expenses: Not on file  Food Insecurity:   . Worried About Charity fundraiser in the Last Year: Not on file  . Ran Out of Food in the Last Year: Not on file  Transportation Needs:   . Lack of Transportation (Medical): Not on file  . Lack of Transportation (Non-Medical): Not on file  Physical Activity:   . Days of Exercise per Week: Not on file  . Minutes of Exercise per Session: Not on file  Stress:   . Feeling of Stress : Not on file  Social Connections:   . Frequency of Communication with Friends and Family: Not on file  . Frequency of Social Gatherings with Friends and Family: Not on file  . Attends Religious Services: Not on file  . Active Member of Clubs or Organizations: Not on file  . Attends Archivist Meetings: Not on file  . Marital Status: Not on file  Intimate Partner Violence:   . Fear of Current or Ex-Partner: Not on file    . Emotionally Abused: Not on file  . Physically Abused: Not on file  . Sexually Abused: Not on file    Family History  Problem Relation Age of Onset  . Hypertension Mother   . Hypertension Father   . Heart disease Father   . Stroke Other   . Diabetes Other   . Arthritis Other   . Hypertension Sister   .  Hypertension Brother   . Colon cancer Neg Hx     Past Surgical History:  Procedure Laterality Date  . CHOLECYSTECTOMY      ROS: Review of Systems Negative except as stated above  PHYSICAL EXAM: BP 132/76   Pulse 98   Resp 16   Wt 184 lb 3.2 oz (83.6 kg)   LMP 02/21/2016   SpO2 99%   BMI 33.69 kg/m   Wt Readings from Last 3 Encounters:  09/29/20 184 lb 3.2 oz (83.6 kg)  06/23/20 185 lb (83.9 kg)  02/04/20 184 lb 6.4 oz (83.6 kg)    Physical Exam  General appearance - alert, well appearing, and in no distress Mental status - normal mood, behavior, speech, dress, motor activity, and thought processes Neck - supple, no significant adenopathy Chest - clear to auscultation, no wheezes, rales or rhonchi, symmetric air entry Heart - normal rate, regular rhythm, normal S1, S2, no murmurs, rubs, clicks or gallops Neurological -hands: Gross sensation intact. No wasting of intrinsic muscles of the hands. Grip 5/5 bilaterally. Tinel's sign negative.  Extremities - peripheral pulses normal, no pedal edema, no clubbing or cyanosis   CMP Latest Ref Rng & Units 02/16/2020 11/04/2019 10/02/2019  Glucose 65 - 99 mg/dL 105(H) 52(L) 167(H)  BUN 6 - 24 mg/dL 14 14 29(H)  Creatinine 0.57 - 1.00 mg/dL 1.35(H) 1.20(H) 1.35(H)  Sodium 134 - 144 mmol/L 139 140 139  Potassium 3.5 - 5.2 mmol/L 4.6 4.5 4.8  Chloride 96 - 106 mmol/L 102 104 103  CO2 20 - 29 mmol/L '21 21 22  ' Calcium 8.7 - 10.2 mg/dL 9.9 9.7 9.3  Total Protein 6.0 - 8.5 g/dL - 7.2 6.9  Total Bilirubin 0.0 - 1.2 mg/dL - 0.3 1.0  Alkaline Phos 39 - 117 IU/L - 100 81  AST 0 - 40 IU/L - 12 13(L)  ALT 0 - 32 IU/L - 13 27    Lipid Panel     Component Value Date/Time   CHOL 175 11/04/2019 1001   TRIG 164 (H) 11/04/2019 1001   HDL 44 11/04/2019 1001   CHOLHDL 4.0 11/04/2019 1001   CHOLHDL 2.9 09/13/2016 0949   VLDL 23 09/13/2016 0949   LDLCALC 102 (H) 11/04/2019 1001    CBC    Component Value Date/Time   WBC 6.5 02/04/2020 1434   WBC 12.2 (H) 10/02/2019 0335   RBC 3.86 02/04/2020 1434   RBC 3.65 (L) 10/02/2019 0335   HGB 10.8 (L) 02/04/2020 1434   HCT 33.4 (L) 02/04/2020 1434   PLT 314 02/04/2020 1434   MCV 87 02/04/2020 1434   MCH 28.0 02/04/2020 1434   MCH 28.2 10/02/2019 0335   MCHC 32.3 02/04/2020 1434   MCHC 32.8 10/02/2019 0335   RDW 14.4 02/04/2020 1434   LYMPHSABS 2.8 11/04/2019 1001   MONOABS 1.0 10/02/2019 0335   EOSABS 0.5 (H) 11/04/2019 1001   BASOSABS 0.1 11/04/2019 1001    ASSESSMENT AND PLAN: 1. Type 2 diabetes mellitus with obesity (HCC) At goal. Continue Metformin. Healthy eating habits discussed and encouraged. In particular we discussed the types of fruits that have lower sugars like berries and apples versus grapes, larger fruits and bananas Continue regular exercise - POCT glycosylated hemoglobin (Hb A1C)  2. Essential hypertension Close to goal. Continue Cozaar at current dose  3. Hyperlipidemia associated with type 2 diabetes mellitus (HCC) Continue simvastatin - Lipid panel  4. Anemia in stage 3a chronic kidney disease ([place]) Stable based on last CBC.  5.  Stage 3a chronic kidney disease ([place]) - Ambulatory referral to Nephrology - Comprehensive metabolic panel  6. Complex renal cyst - Ambulatory referral to Nephrology  7. Raynaud's phenomenon without gangrene History sounds like and suggest Raynaud's phenomenon more so than carpal tunnel. Encouraged her to wear gloves when in cold weather  8. Need for influenza vaccination Given today     Patient was given the opportunity to ask questions.  Patient verbalized understanding of the plan and was able  to repeat key elements of the plan.   Orders Placed This Encounter  Procedures  . Comprehensive metabolic panel  . Lipid panel  . Ambulatory referral to Nephrology  . POCT glycosylated hemoglobin (Hb A1C)     Requested Prescriptions    No prescriptions requested or ordered in this encounter    Return in about 4 months (around 01/30/2021).  Karle Plumber, MD, FACP

## 2020-09-30 ENCOUNTER — Telehealth: Payer: Self-pay | Admitting: Licensed Clinical Social Worker

## 2020-09-30 LAB — COMPREHENSIVE METABOLIC PANEL
ALT: 23 IU/L (ref 0–32)
AST: 20 IU/L (ref 0–40)
Albumin/Globulin Ratio: 1.6 (ref 1.2–2.2)
Albumin: 4.6 g/dL (ref 3.8–4.9)
Alkaline Phosphatase: 87 IU/L (ref 44–121)
BUN/Creatinine Ratio: 13 (ref 9–23)
BUN: 17 mg/dL (ref 6–24)
Bilirubin Total: 0.5 mg/dL (ref 0.0–1.2)
CO2: 23 mmol/L (ref 20–29)
Calcium: 9.5 mg/dL (ref 8.7–10.2)
Chloride: 104 mmol/L (ref 96–106)
Creatinine, Ser: 1.35 mg/dL — ABNORMAL HIGH (ref 0.57–1.00)
GFR calc Af Amer: 50 mL/min/{1.73_m2} — ABNORMAL LOW (ref >59)
GFR calc non Af Amer: 43 mL/min/{1.73_m2} — ABNORMAL LOW (ref >59)
Globulin, Total: 2.8 g/dL (ref 1.5–4.5)
Glucose: 89 mg/dL (ref 65–99)
Potassium: 4.3 mmol/L (ref 3.5–5.2)
Sodium: 142 mmol/L (ref 134–144)
Total Protein: 7.4 g/dL (ref 6.0–8.5)

## 2020-09-30 LAB — LIPID PANEL
Chol/HDL Ratio: 4.4 ratio (ref 0.0–4.4)
Cholesterol, Total: 198 mg/dL (ref 100–199)
HDL: 45 mg/dL (ref >39)
LDL Chol Calc (NIH): 121 mg/dL — ABNORMAL HIGH (ref 0–99)
Triglycerides: 181 mg/dL — ABNORMAL HIGH (ref 0–149)
VLDL Cholesterol Cal: 32 mg/dL (ref 5–40)

## 2020-09-30 NOTE — Telephone Encounter (Signed)
MSW Intern faxed completed [employer] [place] application. Pt was informed of successful transmission.

## 2020-10-02 ENCOUNTER — Other Ambulatory Visit: Payer: Self-pay | Admitting: Internal Medicine

## 2020-10-02 MED ORDER — ATORVASTATIN CALCIUM 20 MG PO TABS: 20.0000 mg | ORAL_TABLET | Freq: Every day | ORAL | 3 refills | Status: DC

## 2020-10-02 NOTE — Progress Notes (Signed)
Let patient know that her kidney function is not 100% but stable compared to 7 months ago.  Liver function tests normal.  LDL cholesterol is 121 with goal being less than 70.  On her most recent visit with me she told me that she is taking the simvastatin consistently.  I would recommend that we discontinue the simvastatin and put her on atorvastatin instead for better cholesterol lowering effects.  I have sent the new prescription to [facility].  Please also call [employer] to let them know of this change.

## 2020-11-02 DIAGNOSIS — E1122 Type 2 diabetes mellitus with diabetic chronic kidney disease: Secondary | ICD-10-CM | POA: Diagnosis not present

## 2020-11-02 DIAGNOSIS — N281 Cyst of kidney, acquired: Secondary | ICD-10-CM | POA: Diagnosis not present

## 2020-11-02 DIAGNOSIS — I129 Hypertensive chronic kidney disease with stage 1 through stage 4 chronic kidney disease, or unspecified chronic kidney disease: Secondary | ICD-10-CM | POA: Diagnosis not present

## 2020-11-02 DIAGNOSIS — N1831 Chronic kidney disease, stage 3a: Secondary | ICD-10-CM | POA: Diagnosis not present

## 2020-11-09 ENCOUNTER — Other Ambulatory Visit: Payer: Self-pay | Admitting: Internal Medicine

## 2020-11-09 DIAGNOSIS — E119 Type 2 diabetes mellitus without complications: Secondary | ICD-10-CM

## 2020-11-09 IMAGING — DX DG CHEST 1V PORT
1 series · 1 of 1 positions shown · non-contrast
Comparison: 01/06/2014

CLINICAL DATA: Flank pain

EXAM:
PORTABLE CHEST 1 VIEW

[chest ap]
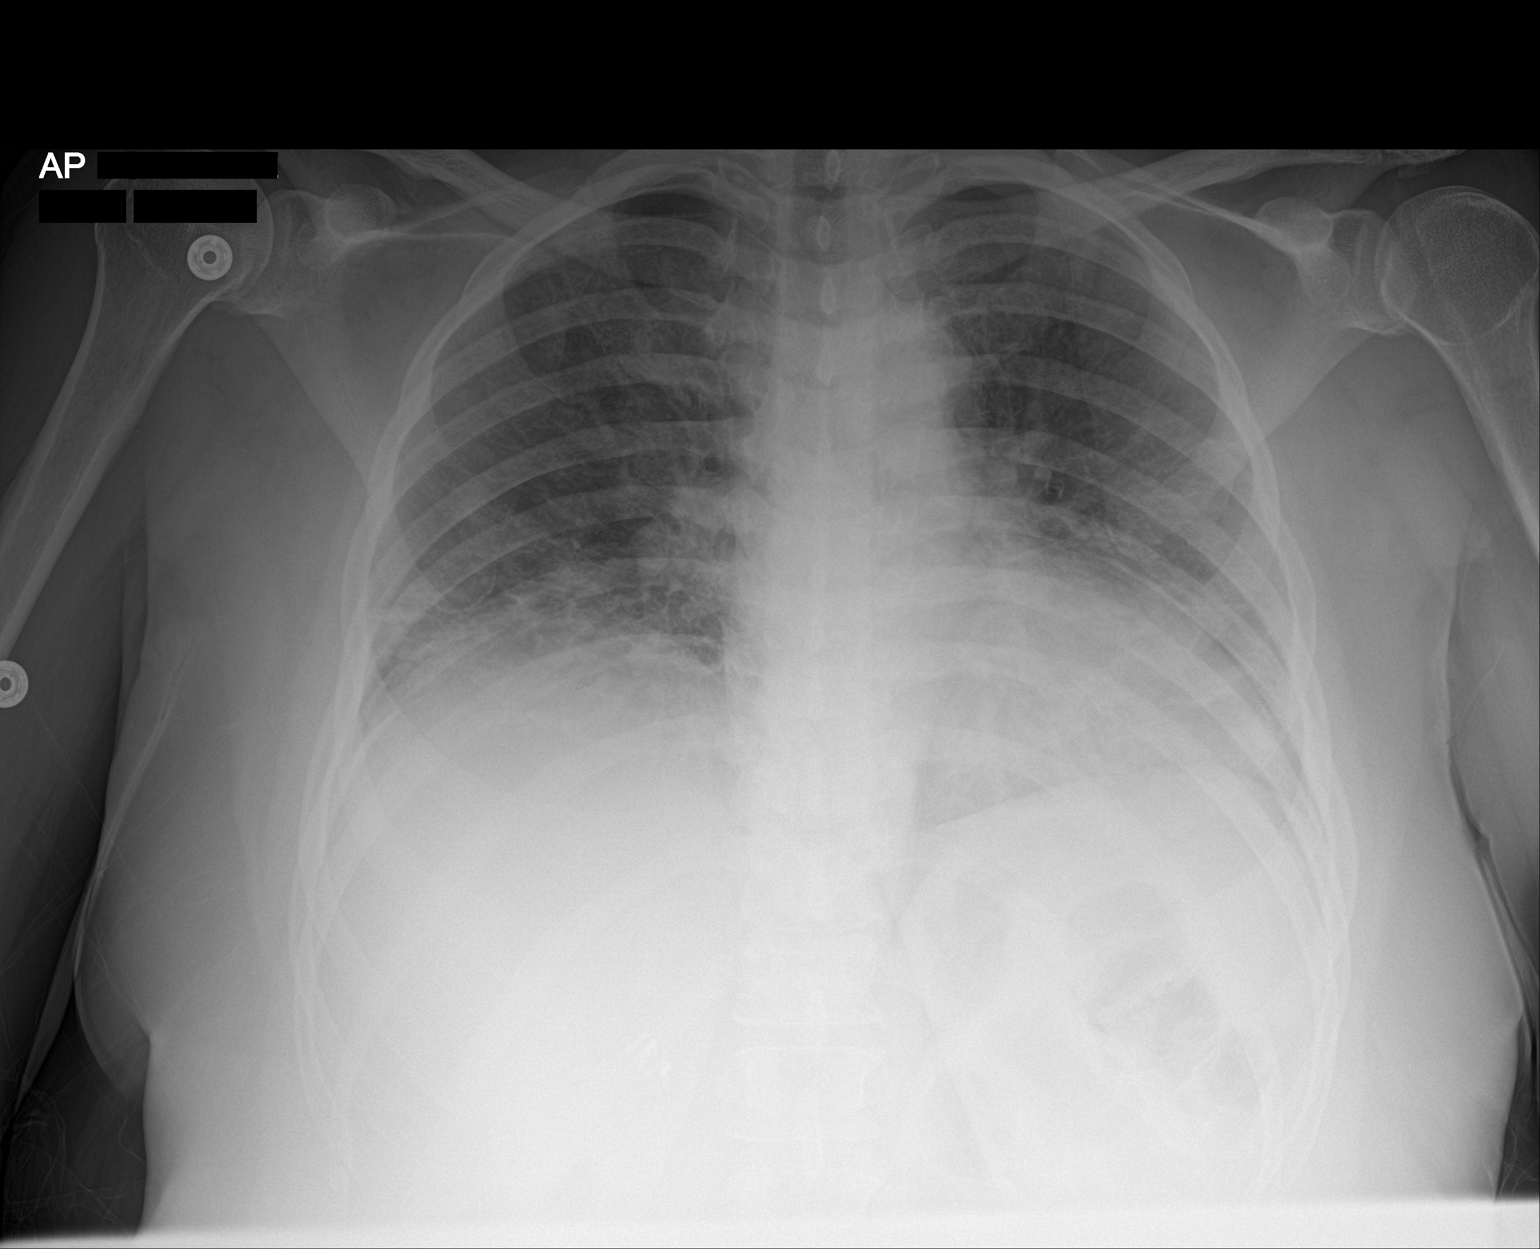

[1 of 1 positions shown; findings below may reference images not displayed]

FINDINGS: Shallow lung inflation with bibasilar opacities. No pneumothorax or
sizable pleural effusion. Cardiomediastinal contours are normal.
IMPRESSION: Shallow lung inflation with bibasilar opacities, which may indicate
atelectasis or developing consolidation.

## 2020-11-10 IMAGING — US US RENAL
1 series · 14 of 25 positions shown · non-contrast
Comparison: None.

CLINICAL DATA: Acute kidney injury

EXAM:
RENAL / URINARY TRACT ULTRASOUND COMPLETE

[Series 1: us renal · 0.22mm/px · 14 of 191 slices shown]
[im 1/191]
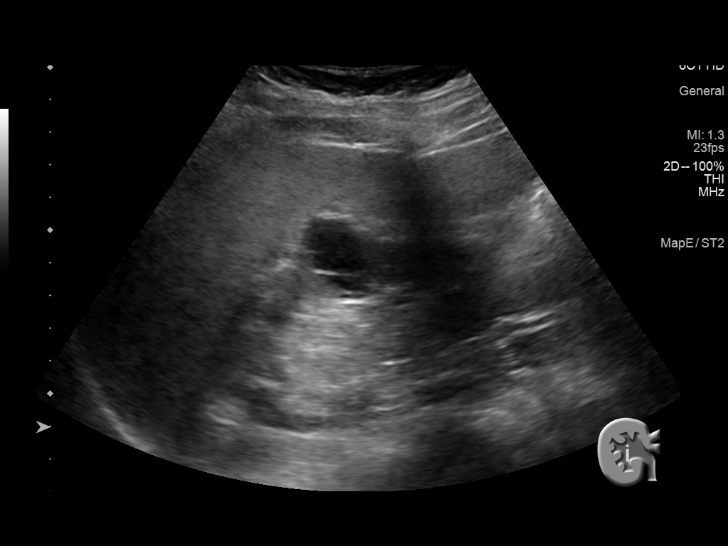
[im 16/191]
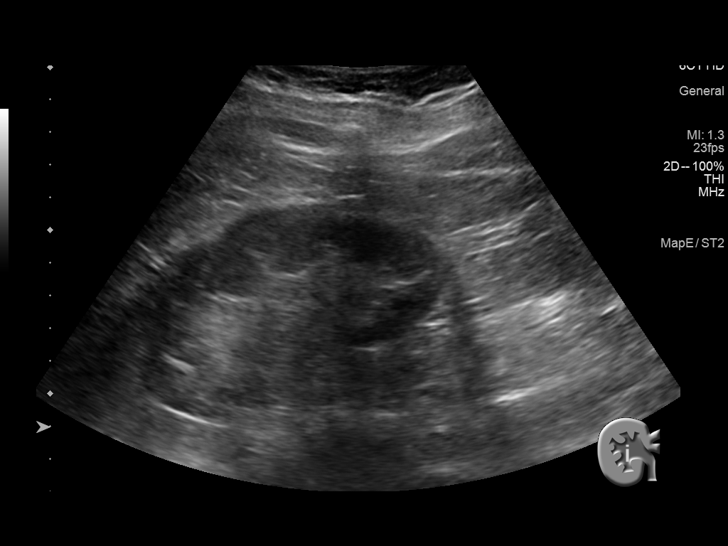
[im 32/191]
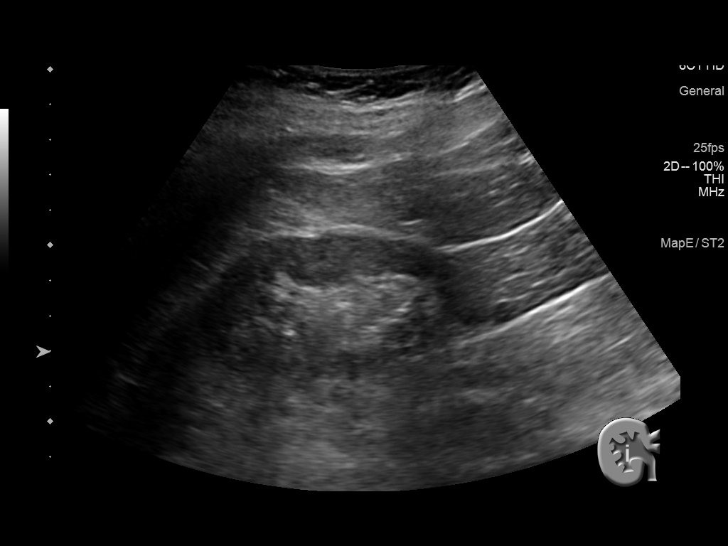
[im 48/191]
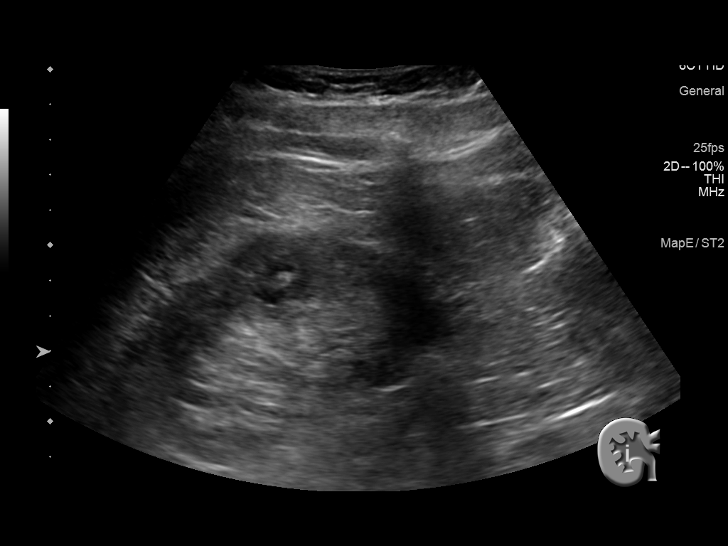
[im 64/191]
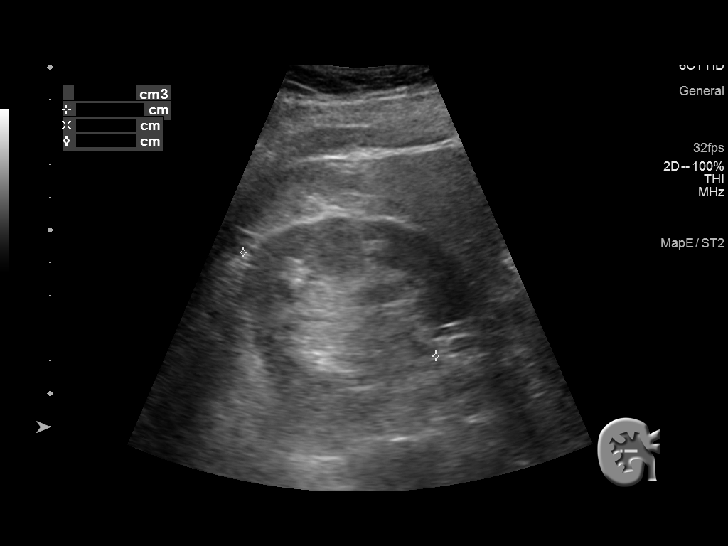
[im 72/191]
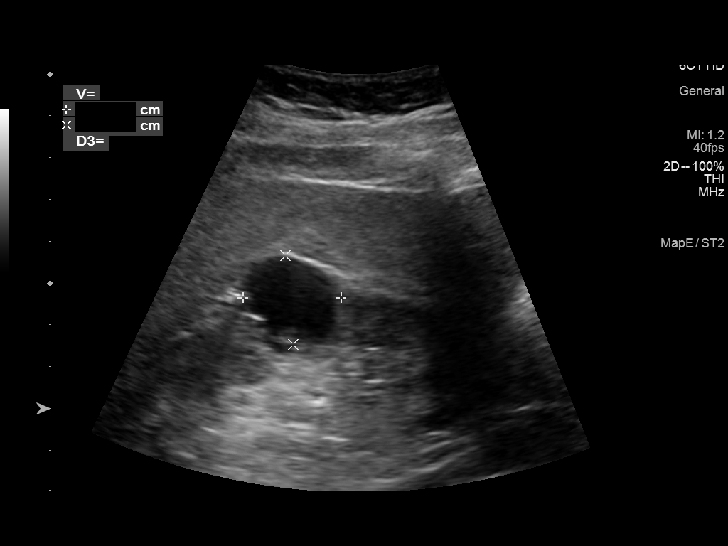
[im 88/191]
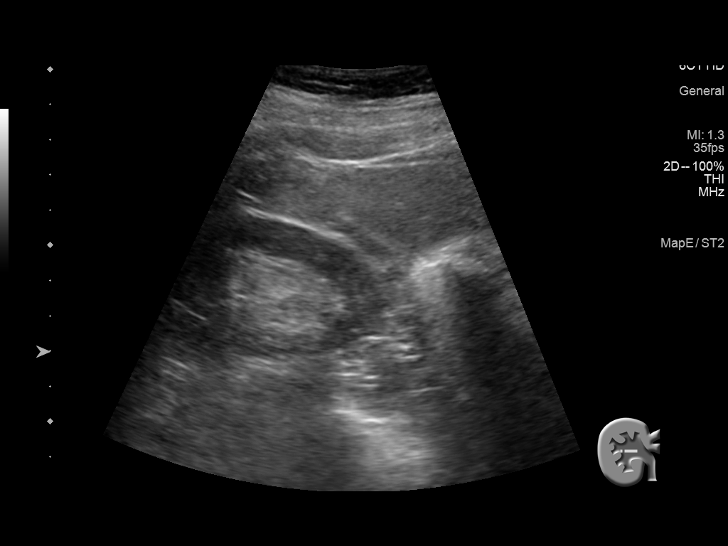
[im 103/191]
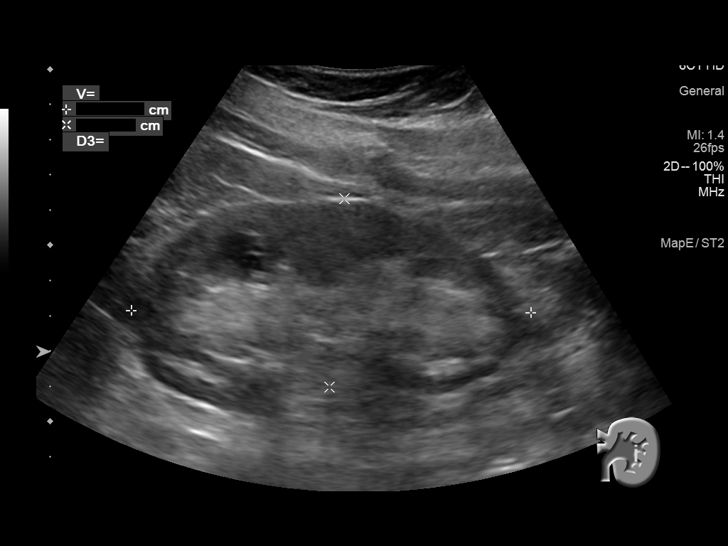
[im 119/191]
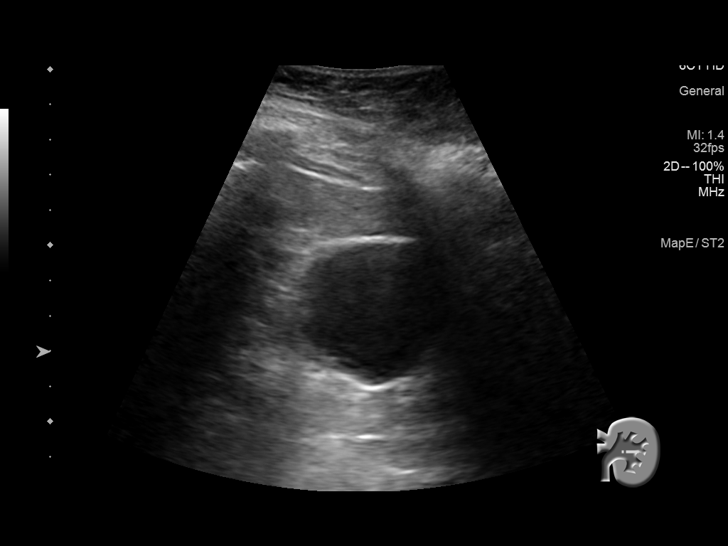
[im 127/191]
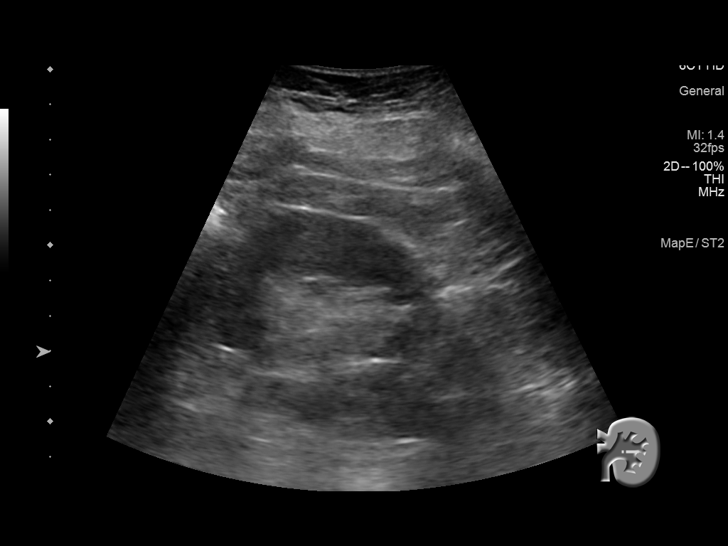
[im 143/191]
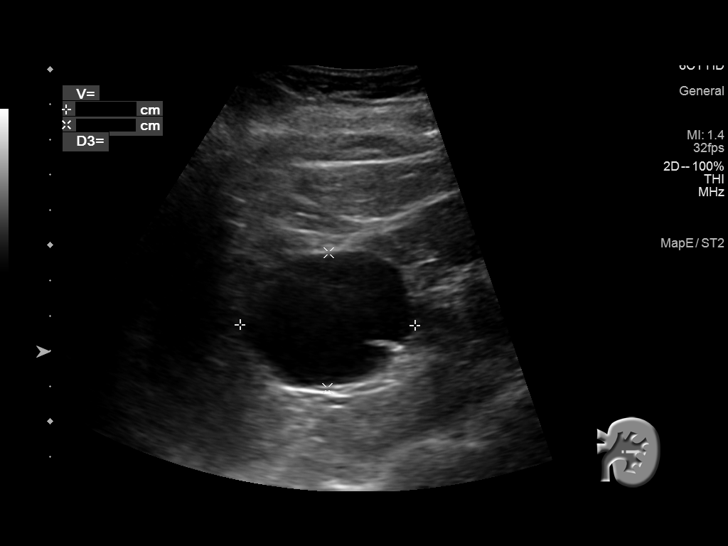
[im 159/191]
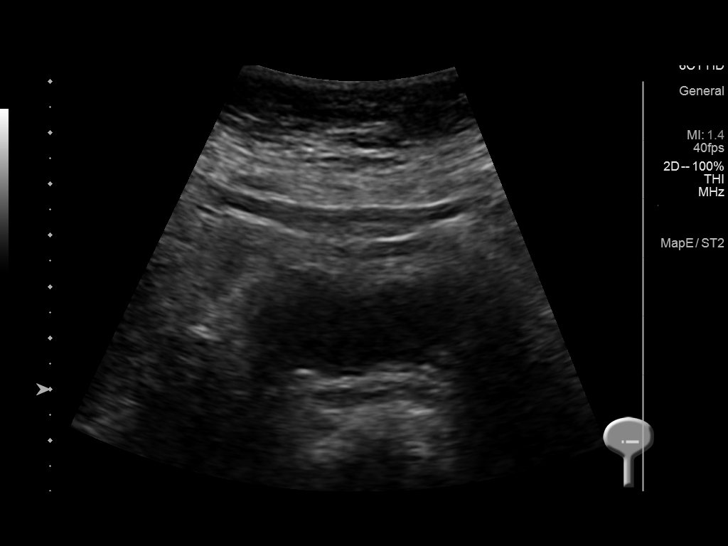
[im 175/191]
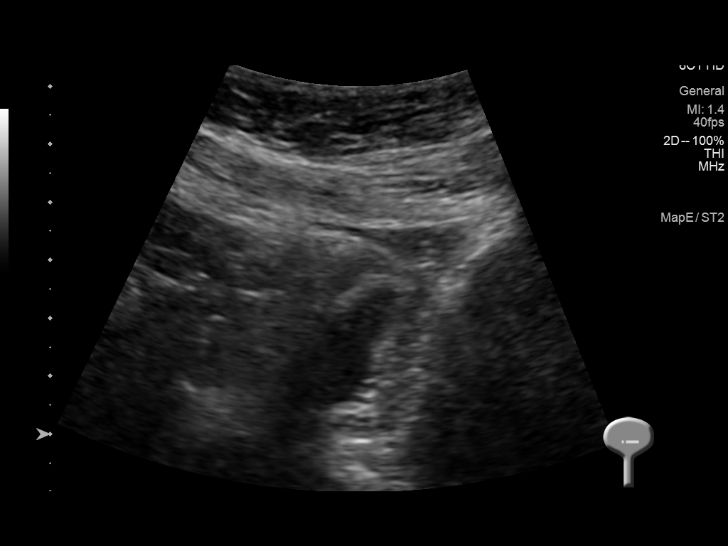
[im 191/191]
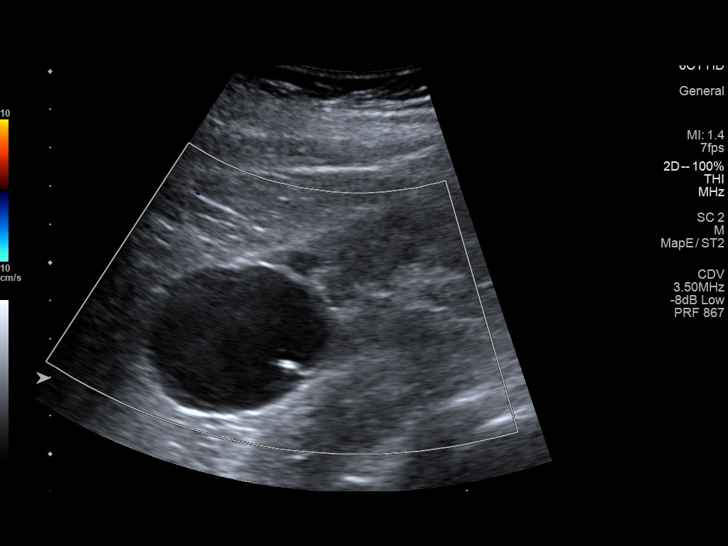

[14 of 25 positions shown; findings below may reference images not displayed]

FINDINGS: Right Kidney:

Renal measurements: 10.4 x 5.2 x 6.7 cm = volume: 188 mL .
Echogenicity within normal limits. 2.4 x 2.1 x 2 cm anechoic right
renal mass with a thin septation most consistent with a cyst. No
solid mass or hydronephrosis visualized.

Left Kidney:

Renal measurements: 11.3 x 5.4 x 5.7 cm = volume: 181 mL.
Echogenicity within normal limits. 5 x 4.5 x 3.9 cm anechoic left
renal mass with a thin septation consistent with a mildly
complicated cyst. 2.9 x 2.8 x 2.5 cm anechoic left renal mass with a
thin septation consistent with a mildly complicated cyst. No solid
mass or hydronephrosis visualized.

Bladder:

Appears normal for degree of bladder distention.
IMPRESSION: 1. No obstructive uropathy.
2. Mildly complicated bilateral cysts.

## 2020-11-30 ENCOUNTER — Encounter: Payer: Self-pay | Admitting: Internal Medicine

## 2020-11-30 DIAGNOSIS — N1832 Chronic kidney disease, stage 3b: Secondary | ICD-10-CM | POA: Insufficient documentation

## 2020-11-30 DIAGNOSIS — N183 Chronic kidney disease, stage 3 unspecified: Secondary | ICD-10-CM | POA: Insufficient documentation

## 2021-01-25 ENCOUNTER — Other Ambulatory Visit: Payer: Self-pay | Admitting: Pharmacist

## 2021-01-25 MED ORDER — ATORVASTATIN CALCIUM 20 MG PO TABS
20.0000 mg | ORAL_TABLET | Freq: Every day | ORAL | 3 refills | Status: DC
Start: 2021-01-25 — End: 2021-02-08

## 2021-01-31 ENCOUNTER — Other Ambulatory Visit: Payer: Self-pay

## 2021-01-31 ENCOUNTER — Ambulatory Visit: Payer: Medicare Other | Attending: Internal Medicine | Admitting: Internal Medicine

## 2021-01-31 ENCOUNTER — Encounter: Payer: Self-pay | Admitting: Internal Medicine

## 2021-01-31 VITALS — Wt 180.0 lb

## 2021-01-31 DIAGNOSIS — E785 Hyperlipidemia, unspecified: Secondary | ICD-10-CM

## 2021-01-31 DIAGNOSIS — I10 Essential (primary) hypertension: Secondary | ICD-10-CM | POA: Diagnosis not present

## 2021-01-31 DIAGNOSIS — E1169 Type 2 diabetes mellitus with other specified complication: Secondary | ICD-10-CM | POA: Diagnosis not present

## 2021-01-31 DIAGNOSIS — E669 Obesity, unspecified: Secondary | ICD-10-CM | POA: Diagnosis not present

## 2021-01-31 DIAGNOSIS — N951 Menopausal and female climacteric states: Secondary | ICD-10-CM | POA: Diagnosis not present

## 2021-01-31 MED ORDER — LOSARTAN POTASSIUM 50 MG PO TABS: ORAL_TABLET | ORAL | 3 refills | Status: DC

## 2021-01-31 MED ORDER — VENLAFAXINE HCL ER 75 MG PO CP24: 75.0000 mg | ORAL_CAPSULE | Freq: Every day | ORAL | 2 refills | Status: DC

## 2021-01-31 MED ORDER — GLIMEPIRIDE 2 MG PO TABS: ORAL_TABLET | ORAL | 2 refills | Status: DC

## 2021-01-31 MED ORDER — METFORMIN HCL 1000 MG PO TABS: 1000.0000 mg | ORAL_TABLET | Freq: Two times a day (BID) | ORAL | 3 refills | Status: DC

## 2021-01-31 NOTE — Progress Notes (Signed)
Virtual Visit via Telephone Note  I connected with [name] Barker on 01/31/21 at 11:50 a.m by telephone and verified that I am speaking with the correct person using two identifiers.  Location: Patient: home Provider: [id number] Pt, my CMA PSallyanne Haversand myself participated in this encounter. I discussed the limitations, risks, security and privacy concerns of performing an evaluation and management service by telephone and the availability of in person appointments. I also discussed with the patient that there may be a patient responsible charge related to this service. The patient expressed understanding and agreed to proceed.   History of Present Illness: Patient with history ofDM with microalbumin, HL, HTN, perimenopausal(On Effexor), anemia chronic ds, insomnia, CKD stage3, BL complicated renal cyst, COVID pneumonia.  Last seen 09/2020  Last MWV was 06/2020  DM: checking BS once a day in the mornings before BF.  Gives range 140. Does well with eating habits.  Does not snack b/w meals much. Wgh today is 180 lbs down 4 lbs since last visit.  Walks around her neighborhood daily Compliant with Metformin and Amaryl   HTN:  No device to check BP but takes Cozaar daily and limits salt in foods No CP/SOB/LE edema  HL: LDL was not at goal on Zocor so we changed to Lipitor.  She confirms that she made the switch and she is tolerating the med  [facility] of 01[facility]20[facility]  Medication Sig  . acetaminophen (TYLENOL) 325 MG tablet Take 2 tablets (650 mg total) by mouth every 6 (six) hours as needed for mild pain or headache.  . ASPIRIN LOW DOSE 81 MG EC tablet TAKE 1 TABLET (81 MG TOTAL) BY MOUTH DAILY.  .Marland Kitchenatorvastatin (LIPITOR) 20 MG tablet Take 1 tablet (20 mg total) by mouth daily. Stop Simvastatin  . Blood Glucose Calibration (ACCU-CHEK SMARTVIEW CONTROL) LIQD Use as directed  . Blood Glucose Monitoring Suppl (ACCU-CHEK NANO  SMARTVIEW) w/Device KIT Use as directed for once daily testing of blood sugar. E11.9  . glimepiride (AMARYL) 2 MG tablet TAKE 1/2 TABLET EVERY DAY WITH BREAKFAST  . losartan (COZAAR) 50 MG tablet TAKE 1 TABLET ONE TIME DAILY  . metFORMIN (GLUCOPHAGE) 1000 MG tablet TAKE 1 TABLET (1,000 MG TOTAL) BY MOUTH 2 (TWO) TIMES DAILY WITH A MEAL.  . Multiple Vitamin (MULTIVITAMIN WITH MINERALS) TABS tablet Take 1 tablet by mouth daily.  . Omega-3 Fatty Acids (FISH OIL) 1000 MG CAPS Take 1 capsule (1,000 mg total) by mouth daily.  .Marland Kitchentetrahydrozoline 0.05 % ophthalmic solution Place 2 drops into both eyes daily as needed (for dry eyes).  . venlafaxine XR (EFFEXOR-XR) 75 MG 24 hr capsule Take 1 capsule (75 mg total) by mouth daily with breakfast.   No facility-administered encounter medications on file as of 01/31/2021.      Observations/Objective: Results for orders placed or performed in visit on 09/29/20  Comprehensive metabolic panel  Result Value Ref Range   Glucose 89 65 - 99 mg/dL   BUN 17 6 - 24 mg/dL   Creatinine, Ser 1.35 (H) 0.57 - 1.00 mg/dL   GFR calc non Af Amer 43 (L) >59 mL/min/1.73   GFR calc Af Amer 50 (L) >59 mL/min/1.73   BUN/Creatinine Ratio 13 9 - 23   Sodium 142 134 - 144 mmol/L   Potassium 4.3 3.5 - 5.2 mmol/L   Chloride 104 96 - 106 mmol/L   CO2 23 20 - 29 mmol/L   Calcium 9.5 8.7 -  10.2 mg/dL   Total Protein 7.4 6.0 - 8.5 g/dL   Albumin 4.6 3.8 - 4.9 g/dL   Globulin, Total 2.8 1.5 - 4.5 g/dL   Albumin/Globulin Ratio 1.6 1.2 - 2.2   Bilirubin Total 0.5 0.0 - 1.2 mg/dL   Alkaline Phosphatase 87 44 - 121 IU/L   AST 20 0 - 40 IU/L   ALT 23 0 - 32 IU/L  Lipid panel  Result Value Ref Range   Cholesterol, Total 198 100 - 199 mg/dL   Triglycerides 181 (H) 0 - 149 mg/dL   HDL 45 >39 mg/dL   VLDL Cholesterol Cal 32 5 - 40 mg/dL   LDL Chol Calc (NIH) 121 (H) 0 - 99 mg/dL   Chol/HDL Ratio 4.4 0.0 - 4.4 ratio  POCT glycosylated hemoglobin (Hb A1C)  Result Value Ref Range    Hemoglobin A1C     HbA1c POC (<> result, manual entry)     HbA1c, POC (prediabetic range) 6.4 5.7 - 6.4 %   HbA1c, POC (controlled diabetic range)       Assessment and Plan: 1. Type 2 diabetes mellitus with obesity ([place]) Commended her on weight loss.  Encouraged her to continue healthy eating habits and regular exercise.  Continue current dose of Metformin and Amaryl.  She will come to the lab later this week to have lipid profile and A1c. - Hemoglobin A1c; Future - glimepiride (AMARYL) 2 MG tablet; TAKE 1/2 TABLET EVERY DAY WITH BREAKFAST  Dispense: 45 tablet; Refill: 2 - metFORMIN (GLUCOPHAGE) 1000 MG tablet; Take 1 tablet (1,000 mg total) by mouth 2 (two) times daily with a meal.  Dispense: 180 tablet; Refill: 3  2. Essential hypertension Continue Cozaar and low-salt diet - losartan (COZAAR) 50 MG tablet; TAKE 1 TABLET ONE TIME DAILY  Dispense: 90 tablet; Refill: 3  3. Hyperlipidemia associated with type 2 diabetes mellitus ([place]) - Lipid panel; Future  4. Perimenopausal - venlafaxine XR (EFFEXOR-XR) 75 MG 24 hr capsule; Take 1 capsule (75 mg total) by mouth daily with breakfast.  Dispense: 90 capsule; Refill: 2   Follow Up Instructions: 4 mths   I discussed the assessment and treatment plan with the patient. The patient was provided an opportunity to ask questions and all were answered. The patient agreed with the plan and demonstrated an understanding of the instructions.   The patient was advised to call back or seek an in-person evaluation if the symptoms worsen or if the condition fails to improve as anticipated.  I provided 12 minutes of non-face-to-face time during this encounter.   Karle Plumber, MD

## 2021-02-03 ENCOUNTER — Other Ambulatory Visit: Payer: Self-pay

## 2021-02-03 ENCOUNTER — Ambulatory Visit: Payer: Medicare Other | Attending: Internal Medicine

## 2021-02-03 DIAGNOSIS — E669 Obesity, unspecified: Secondary | ICD-10-CM

## 2021-02-03 DIAGNOSIS — E1169 Type 2 diabetes mellitus with other specified complication: Secondary | ICD-10-CM

## 2021-02-04 LAB — LIPID PANEL
Chol/HDL Ratio: 4.1 ratio (ref 0.0–4.4)
Cholesterol, Total: 177 mg/dL (ref 100–199)
HDL: 43 mg/dL (ref >39)
LDL Chol Calc (NIH): 110 mg/dL — ABNORMAL HIGH (ref 0–99)
Triglycerides: 135 mg/dL (ref 0–149)
VLDL Cholesterol Cal: 24 mg/dL (ref 5–40)

## 2021-02-04 LAB — HEMOGLOBIN A1C
Est. average glucose Bld gHb Est-mCnc: 148 mg/dL
Hgb A1c MFr Bld: 6.8 % — ABNORMAL HIGH (ref 4.8–5.6)

## 2021-02-04 NOTE — Progress Notes (Signed)
Let [name] Barker know that her LDL cholesterol is 110 with goal being less than 70.  High cholesterol increases the risk for heart attack and strokes.  Please confirm whether or not she has been taking the atorvastatin 20 mg consistently.  If she has been taking it consistently then we need to increase the dose to 1-1/2 tablets daily which would be 30 mg a day.  Please let me know her reply so I know whether to update her prescription.  Her A1c is 6.8 with goal being less than 7.  This means her diabetes is under good control.  Keep up the good works.

## 2021-02-08 ENCOUNTER — Other Ambulatory Visit: Payer: Self-pay | Admitting: Internal Medicine

## 2021-02-08 MED ORDER — ATORVASTATIN CALCIUM 20 MG PO TABS: 30.0000 mg | ORAL_TABLET | Freq: Every day | ORAL | 3 refills | Status: DC

## 2021-03-20 ENCOUNTER — Other Ambulatory Visit: Payer: Self-pay | Admitting: Internal Medicine

## 2021-03-20 DIAGNOSIS — Z1231 Encounter for screening mammogram for malignant neoplasm of breast: Secondary | ICD-10-CM

## 2021-04-20 IMAGING — MG DIGITAL SCREENING BILAT W/ TOMO W/ CAD
8 series · 8 of 24 positions shown · non-contrast
Comparison: Previous exam(s).

CLINICAL DATA: Screening.

EXAM:
DIGITAL SCREENING BILATERAL MAMMOGRAM WITH TOMO AND CAD

[R CC synth-2D]
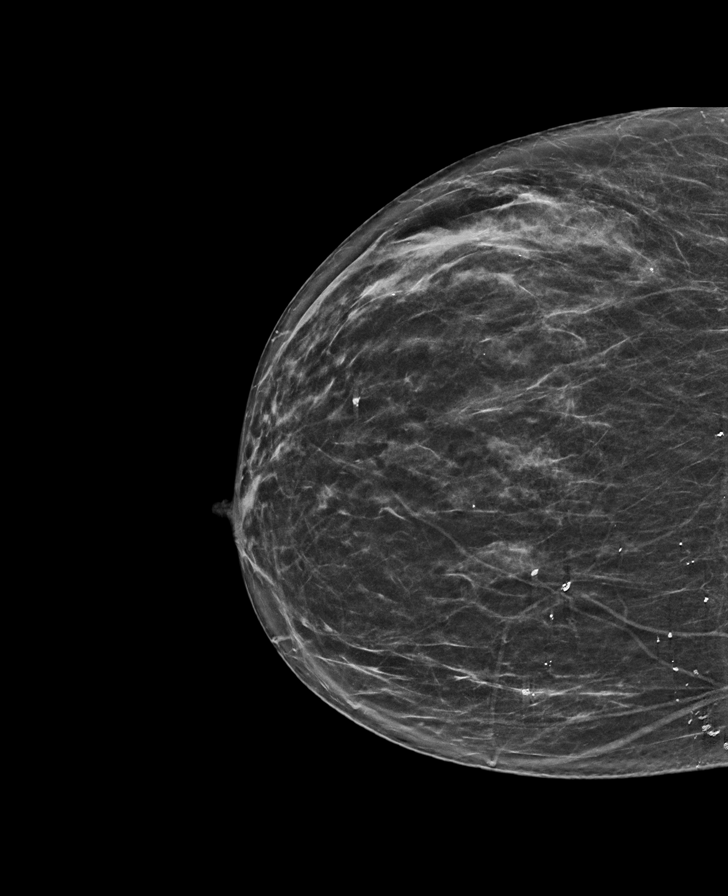

[R MLO synth-2D]
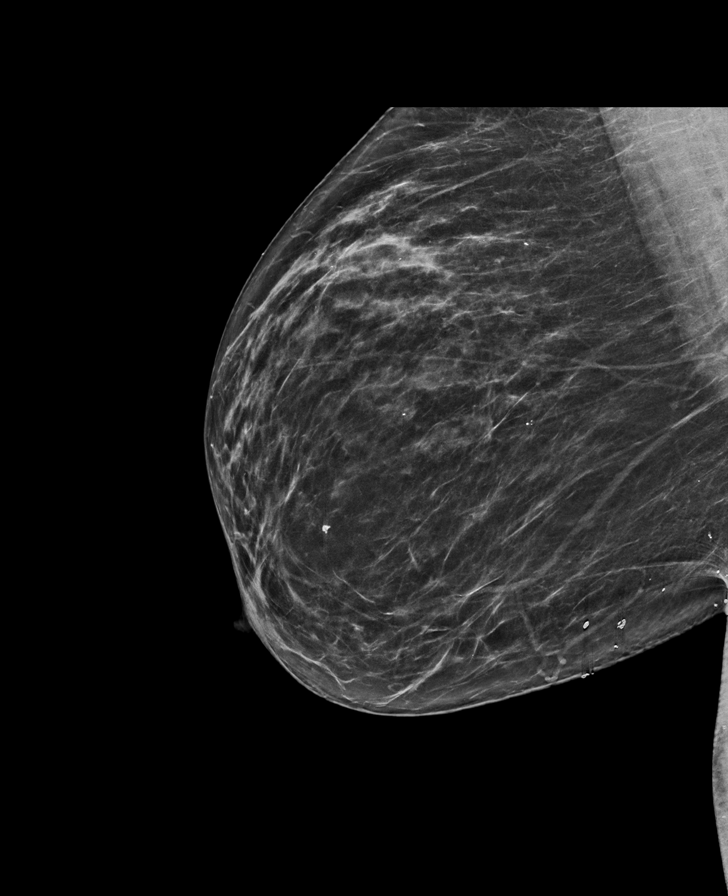

[L CC synth-2D]
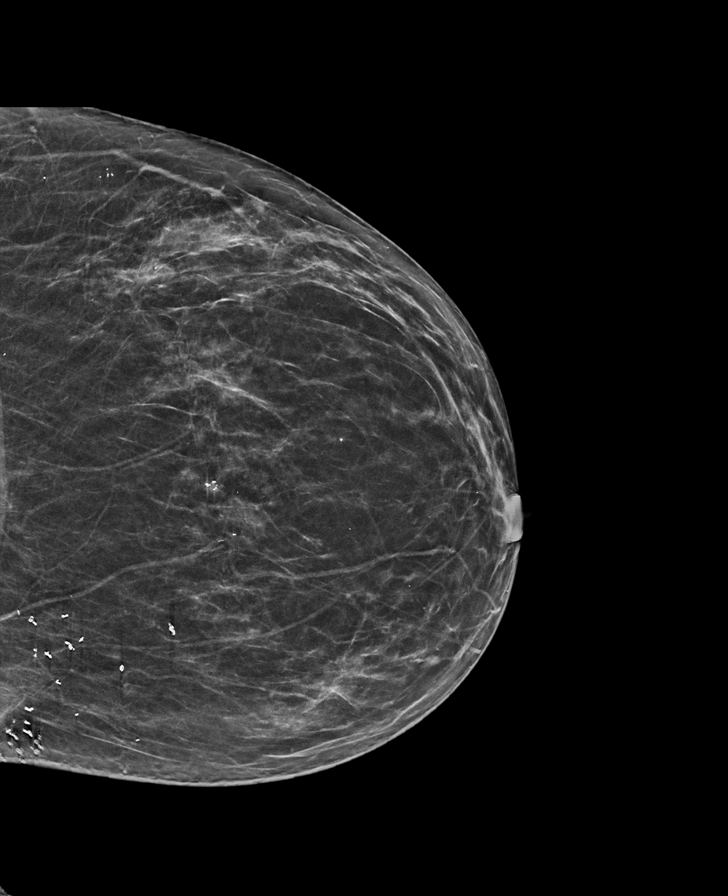

[L MLO synth-2D]
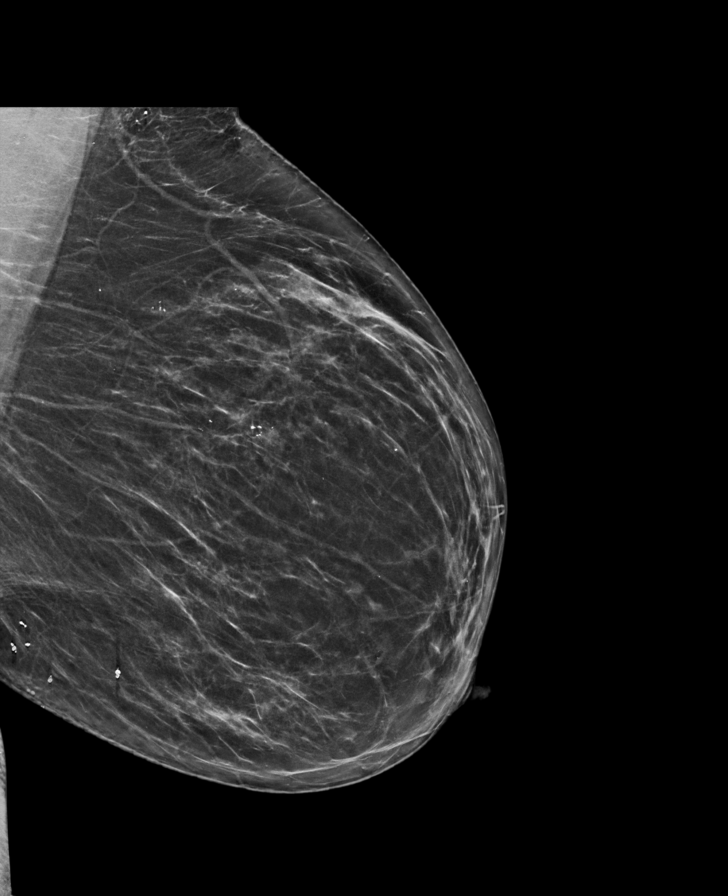

[L CC tomo · tomo slice 33/64.0]
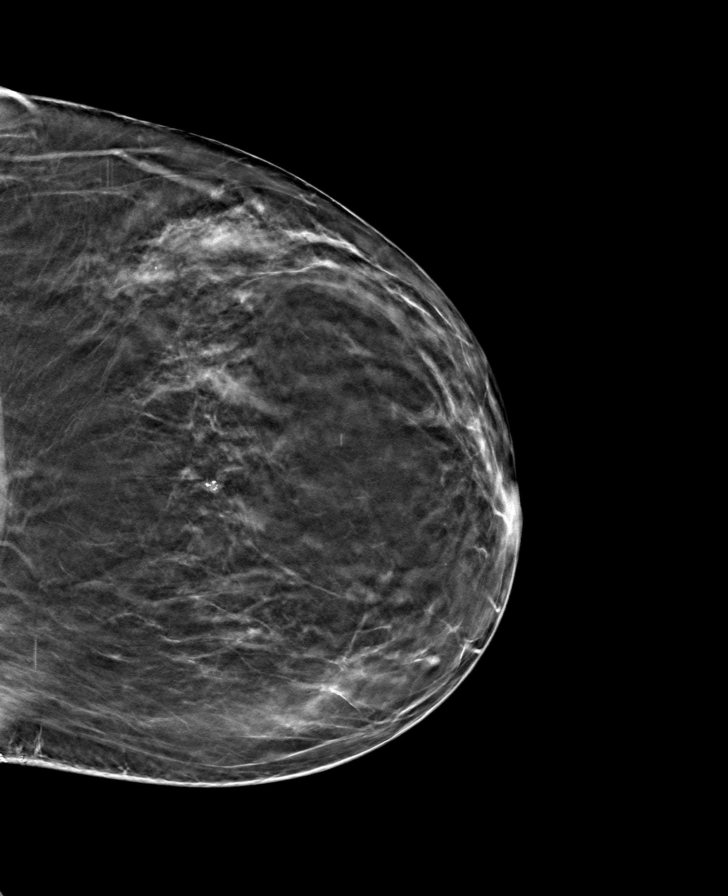

[L MLO tomo · tomo slice 35/69.0]
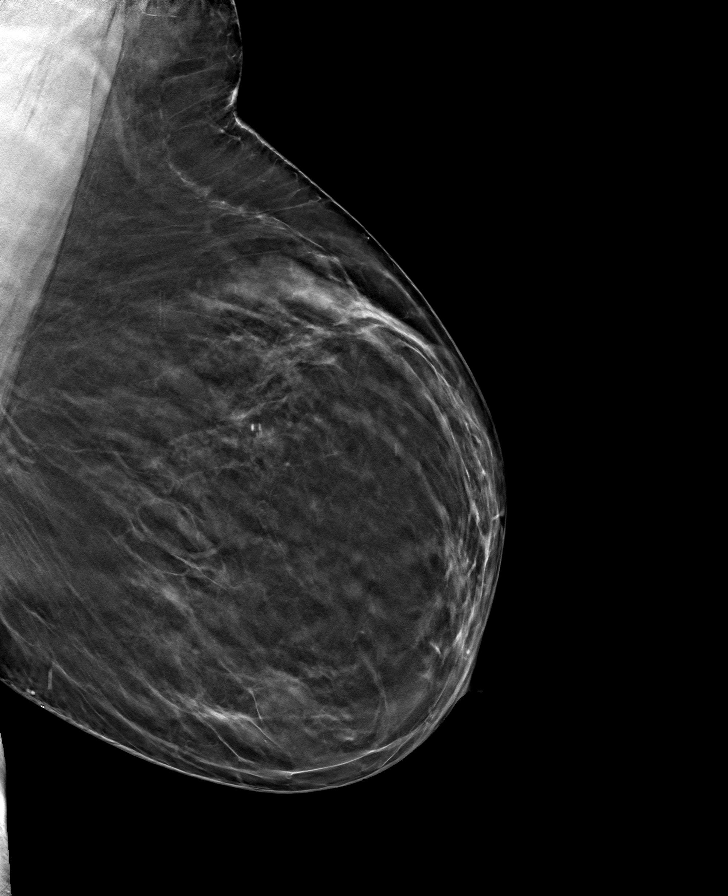

[R MLO tomo · tomo slice 35/68.0]
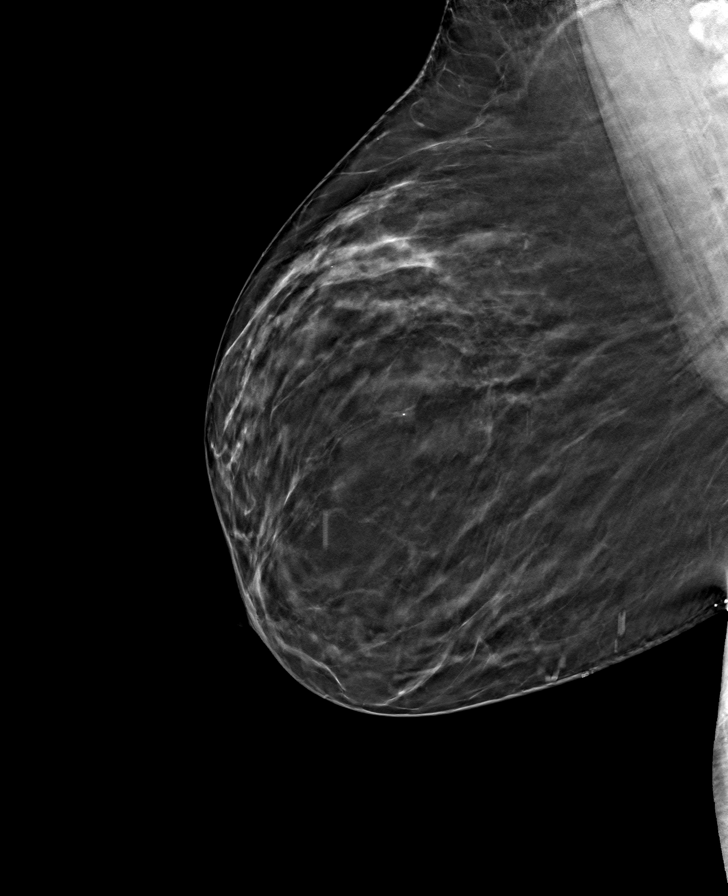

[R CC tomo · tomo slice 31/60.0]
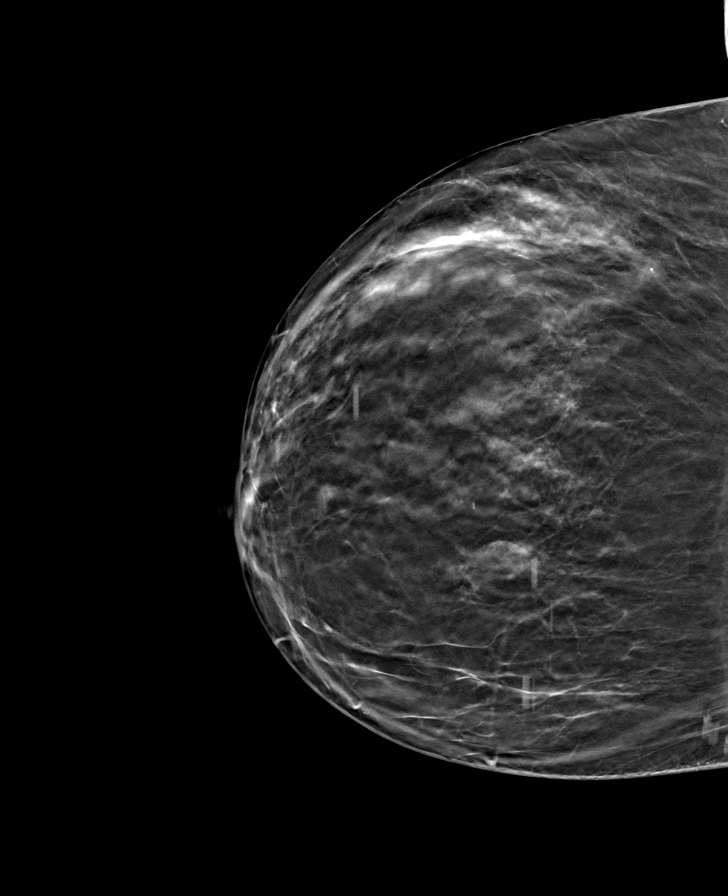

[8 of 24 positions shown; findings below may reference images not displayed]

ACR Breast Density Category b: There are scattered areas of
fibroglandular density.
FINDINGS: There are no findings suspicious for malignancy. Images were
processed with CAD.
IMPRESSION: No mammographic evidence of malignancy. A result letter of this
screening mammogram will be mailed directly to the patient.

RECOMMENDATION:
Screening mammogram in one year. (Code:[id number])

BI-RADS CATEGORY  1: Negative.

## 2021-05-10 ENCOUNTER — Other Ambulatory Visit: Payer: Self-pay

## 2021-05-10 ENCOUNTER — Ambulatory Visit
Admission: RE | Admit: 2021-05-10 | Discharge: 2021-05-10 | Disposition: A | Discharge status: Home / Self Care | Payer: Medicare Other | Source: Ambulatory Visit | Attending: Internal Medicine | Admitting: Internal Medicine

## 2021-05-10 DIAGNOSIS — Z1231 Encounter for screening mammogram for malignant neoplasm of breast: Secondary | ICD-10-CM

## 2021-05-15 ENCOUNTER — Telehealth: Payer: Self-pay

## 2021-05-15 NOTE — Telephone Encounter (Signed)
Contacted pt to go over mm results pt is aware and doesn't have any questions or concerns  

## 2021-05-24 ENCOUNTER — Other Ambulatory Visit: Payer: Self-pay

## 2021-06-01 ENCOUNTER — Encounter (INDEPENDENT_AMBULATORY_CARE_PROVIDER_SITE_OTHER): Payer: Self-pay

## 2021-06-01 ENCOUNTER — Other Ambulatory Visit: Payer: Self-pay

## 2021-06-01 ENCOUNTER — Ambulatory Visit: Payer: Medicare Other | Attending: Internal Medicine | Admitting: Internal Medicine

## 2021-06-01 VITALS — BP 131/85 | HR 91 | Resp 16 | Wt 181.4 lb

## 2021-06-01 DIAGNOSIS — E785 Hyperlipidemia, unspecified: Secondary | ICD-10-CM

## 2021-06-01 DIAGNOSIS — E669 Obesity, unspecified: Secondary | ICD-10-CM

## 2021-06-01 DIAGNOSIS — E6609 Other obesity due to excess calories: Secondary | ICD-10-CM | POA: Insufficient documentation

## 2021-06-01 DIAGNOSIS — E11649 Type 2 diabetes mellitus with hypoglycemia without coma: Secondary | ICD-10-CM | POA: Diagnosis not present

## 2021-06-01 DIAGNOSIS — I10 Essential (primary) hypertension: Secondary | ICD-10-CM

## 2021-06-01 DIAGNOSIS — Z6833 Body mass index (BMI) 33.0-33.9, adult: Secondary | ICD-10-CM

## 2021-06-01 DIAGNOSIS — E1169 Type 2 diabetes mellitus with other specified complication: Secondary | ICD-10-CM | POA: Diagnosis not present

## 2021-06-01 LAB — GLUCOSE, POCT (MANUAL RESULT ENTRY)
POC Glucose: 68 mg/dl — AB (ref 70–99)
POC Glucose: 79 mg/dl (ref 70–99)
POC Glucose: 82 mg/dl (ref 70–99)

## 2021-06-01 LAB — POCT GLYCOSYLATED HEMOGLOBIN (HGB A1C): HbA1c, POC (controlled diabetic range): 6.2 % (ref 0.0–7.0)

## 2021-06-01 MED ORDER — AMLODIPINE BESYLATE 5 MG PO TABS
5.0000 mg | ORAL_TABLET | Freq: Every day | ORAL | 3 refills | Status: DC
Start: 2021-06-01 — End: 2021-10-02

## 2021-06-01 NOTE — Patient Instructions (Signed)
Stop glimepiride.  Your blood pressure is not at goal.  We have added a new medication called amlodipine 5 mg daily.  Continue to check blood pressure at least twice a week with goal being 130/80 or lower.

## 2021-06-01 NOTE — Progress Notes (Signed)
Patient ID: [name] Barker, female    DOB: 1961/01/08  MRN: [id number]  CC: Diabetes and Hypertension   Subjective: [name] Barker is a 60 y.o. female who presents for chronic ds management Her concerns today include:  Patient with history of DM with microalbumin, HL, HTN, perimenopausal (On Effexor), anemia chronic ds, insomnia, CKD stage 3, BL complicated renal cyst, COVID pneumonia.    Last [place] 06/2020  DIABETES TYPE 2 Last A1C:   Results for orders placed or performed in visit on 06/01/21  POCT glucose (manual entry)  Result Value Ref Range   POC Glucose 68 (A) 70 - 99 mg/dl  POCT glycosylated hemoglobin (Hb A1C)  Result Value Ref Range   Hemoglobin A1C     HbA1c POC (<> result, manual entry)     HbA1c, POC (prediabetic range)     HbA1c, POC (controlled diabetic range) 6.2 0.0 - 7.0 %  POCT glucose (manual entry)  Result Value Ref Range   POC Glucose 79 70 - 99 mg/dl  POCT glucose (manual entry)  Result Value Ref Range   POC Glucose 82 70 - 99 mg/dl    Med Adherence:  '[x]'  Yes she is on Amaryl 2 mg half tablet daily and metformin 1 g twice a day. Medication side effects:  '[]'  Yes    '[x]'  No Home Monitoring?  '[x]'  Yes    '[]'  No Home glucose results range: checks BS once a day 1 hr after lunch.  Gives range 160-200.  BS low today.  Pt states she did not eat breakfast before she came.  Could not tell BS was low.  She was given to fruit cocktails today here in the office. Diet Adherence: '[x]'  Yes -small portions, veggies, bread sometimes but not daily Exercise: '[x]'  Yes -does a lot of walking in her neighborhood Hypoglycemic episodes?: '[]'  Yes    '[]'  No Numbness of the feet? '[x]'  Yes  -numbness in fingers nd-4th fingers BL at nights.  Has splint Retinopathy hx? '[]'  Yes    '[]'  No Last eye exam:  Comments:   HTN:  compliant with Cozaar. No CP/SOB/LE edema Has monitor but she has not been checking blood pressure.    HL: Compliant with atorvastatin  CKD3:  saw kidney [place]  last mth and reports being told that her kidney function was stable.  She is not on any NSAIDs.  Still making good urine. Patient Active Problem List   Diagnosis Date Noted   CKD (chronic kidney disease) stage 3, GFR 30-59 ml/min ([place]) 11/30/2020   Raynaud's phenomenon without gangrene 09/29/2020   Anemia in stage 3a chronic kidney disease ([place] [address]) 09/29/2020   Anemia, chronic disease 02/07/2020   COVID-19 virus infection 09/28/2019   Type II diabetes mellitus with renal manifestations ([place]) 09/28/2019   HTN (hypertension) 09/28/2019   HLD (hyperlipidemia) 09/28/2019   Insomnia due to medical condition 09/13/2016   Perimenopausal 09/13/2016   Hyperlipidemia associated with type 2 diabetes mellitus ([place]) 07/22/2014   Esophageal reflux 07/22/2014   Diabetes ([place]) 04/22/2014   Essential hypertension, benign 04/22/2014     Current Outpatient Medications on File Prior to Visit  Medication Sig Dispense Refill   acetaminophen (TYLENOL) 325 MG tablet Take 2 tablets (650 mg total) by mouth every 6 (six) hours as needed for mild pain or headache.     ASPIRIN LOW DOSE 81 MG EC tablet TAKE 1 TABLET (81 MG TOTAL) BY MOUTH DAILY. 90 tablet 6   atorvastatin (LIPITOR) 20 MG tablet Take 1.5  tablets (30 mg total) by mouth daily. 135 tablet 3   Blood Glucose Calibration (ACCU-CHEK SMARTVIEW CONTROL) LIQD Use as directed 1 each 2   Blood Glucose Monitoring Suppl (ACCU-CHEK NANO SMARTVIEW) w/Device KIT Use as directed for once daily testing of blood sugar. E11.9 1 kit 0   losartan (COZAAR) 50 MG tablet TAKE 1 TABLET ONE TIME DAILY 90 tablet 3   metFORMIN (GLUCOPHAGE) 1000 MG tablet Take 1 tablet (1,000 mg total) by mouth 2 (two) times daily with a meal. 180 tablet 3   Multiple Vitamin (MULTIVITAMIN WITH MINERALS) TABS tablet Take 1 tablet by mouth daily. 100 tablet 0   Omega-3 Fatty Acids (FISH OIL) 1000 MG CAPS Take 1 capsule (1,000 mg total) by mouth daily. 100 capsule 1   tetrahydrozoline 0.05 % ophthalmic  solution Place 2 drops into both eyes daily as needed (for dry eyes).     venlafaxine XR (EFFEXOR-XR) 75 MG 24 hr capsule Take 1 capsule (75 mg total) by mouth daily with breakfast. 90 capsule 2   No current facility-administered medications on file prior to visit.    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    Social Determinants of Health   Financial Resource Strain: Not on file  Food Insecurity: Not on file  Transportation Needs: Not on file  Physical Activity: Not on file  Stress: Not on file  Social Connections: Not on file  Intimate Partner Violence: Not on file    Family History  Problem Relation Age of Onset   Hypertension Mother    Hypertension Father    Heart disease Father    Stroke Other    Diabetes Other    Arthritis Other    Hypertension Sister    Hypertension Brother    Colon cancer Neg Hx     Past Surgical History:  Procedure Laterality Date   CHOLECYSTECTOMY      ROS: Review of Systems Negative except as stated above  PHYSICAL EXAM: BP 131/85   Pulse 91   Resp 16   Wt 181 lb 6.4 oz (82.3 kg)   LMP 02/21/2016   SpO2 95%   BMI 33.18 kg/m   Wt Readings from Last 3 Encounters:  06/01/21 181 lb 6.4 oz (82.3 kg)  01/31/21 180 lb (81.6 kg)  09/29/20 184 lb 3.2 oz (83.6 kg)    Physical Exam  General appearance - alert, well appearing, older African-American female and in no distress Mental status - normal mood, behavior, speech, dress, motor  activity, and thought processes Neck - supple, no significant adenopathy Chest - clear to auscultation, no wheezes, rales or rhonchi, symmetric air entry Heart - normal rate, regular rhythm, normal S1, S2, no murmurs, rubs, clicks or gallops Extremities - peripheral pulses normal, no pedal edema, no clubbing or cyanosis  CMP Latest Ref Rng & Units 09/29/2020 02/16/2020 11/04/2019  Glucose  65 - 99 mg/dL 89 105(H) 52(L)  BUN 6 - 24 mg/dL '17 14 14  ' Creatinine 0.57 - 1.00 mg/dL 1.35(H) 1.35(H) 1.20(H)  Sodium 134 - 144 mmol/L 142 139 140  Potassium 3.5 - 5.2 mmol/L 4.3 4.6 4.5  Chloride 96 - 106 mmol/L 104 102 104  CO2 20 - 29 mmol/L '23 21 21  ' Calcium 8.7 - 10.2 mg/dL 9.5 9.9 9.7  Total Protein 6.0 - 8.5 g/dL 7.4 - 7.2  Total Bilirubin 0.0 - 1.2 mg/dL 0.5 - 0.3  Alkaline Phos 44 - 121 IU/L 87 - 100  AST 0 - 40 IU/L 20 - 12  ALT 0 - 32 IU/L 23 - 13   Lipid Panel     Component Value Date/Time   CHOL 177 02/03/2021 1055   TRIG 135 02/03/2021 1055   HDL 43 02/03/2021 1055   CHOLHDL 4.1 02/03/2021 1055   CHOLHDL 2.9 09/13/2016 0949   VLDL 23 09/13/2016 0949   LDLCALC 110 (H) 02/03/2021 1055    CBC    Component Value Date/Time   WBC 6.5 02/04/2020 1434   WBC 12.2 (H) 10/02/2019 0335   RBC 3.86 02/04/2020 1434   RBC 3.65 (L) 10/02/2019 0335   HGB 10.8 (L) 02/04/2020 1434   HCT 33.4 (L) 02/04/2020 1434   PLT 314 02/04/2020 1434   MCV 87 02/04/2020 1434   MCH 28.0 02/04/2020 1434   MCH 28.2 10/02/2019 0335   MCHC 32.3 02/04/2020 1434   MCHC 32.8 10/02/2019 0335   RDW 14.4 02/04/2020 1434   LYMPHSABS 2.8 11/04/2019 1001   MONOABS 1.0 10/02/2019 0335   EOSABS 0.5 (H) 11/04/2019 1001   BASOSABS 0.1 11/04/2019 1001    ASSESSMENT AND PLAN:  1. Type 2 diabetes mellitus with hypoglycemia without coma, without long-term current use of insulin ([place]) Patient has some hypoglycemic unawareness.  Stop Amaryl.  Continue metformin.  Advise of blood sugar goal of less than 182 hours after  meal or 90-130 before meals. - POCT glucose (manual entry) - POCT glycosylated hemoglobin (Hb A1C) - Microalbumin / creatinine urine ratio - POCT glucose (manual entry) - POCT glucose (manual entry)  2. Class 1 obesity due to excess calories with serious comorbidity and body mass index (BMI) of 33.0 to 33.9 in adult Discussed healthy eating habits. Commended her on regular exercise.  Encouraged her to try getting about 30 minutes 3 to 4 days a week.  3. Essential hypertension Not at goal.  Add amlodipine 5 mg daily.  Continue Cozaar. - amLODipine (NORVASC) 5 MG tablet; Take 1 tablet (5 mg total) by mouth daily.  Dispense: 90 tablet; Refill: 3  4. Hyperlipidemia associated with type 2 diabetes mellitus (HCC) Continue atorvastatin.    Patient was given the opportunity to ask questions.  Patient verbalized understanding of the plan and was able to repeat key elements of the plan.   Orders Placed This Encounter  Procedures   Microalbumin / creatinine urine ratio   POCT glucose (manual entry)   POCT glycosylated hemoglobin (Hb A1C)   POCT glucose (manual entry)   POCT glucose (manual entry)     Requested Prescriptions   Signed Prescriptions Disp Refills   amLODipine (NORVASC) 5 MG tablet 90 tablet 3    Sig: Take 1 tablet (5 mg total) by mouth daily.    Return in about 4 months (around 10/01/2021) for Give appt with Lurena Joiner in early July for [facility] Visit.  Karle Plumber, MD, FACP

## 2021-06-02 LAB — MICROALBUMIN / CREATININE URINE RATIO
Creatinine, Urine: 79.4 mg/dL
Microalb/Creat Ratio: 135 mg/g creat — ABNORMAL HIGH (ref 0–29)
Microalbumin, Urine: 107.5 ug/mL

## 2021-06-03 NOTE — Progress Notes (Signed)
Let patient know that she still has a small amount of protein in the urine.  Continue Cozaar which helps protect the kidneys from the effects of diabetes.

## 2021-06-07 ENCOUNTER — Telehealth: Payer: Self-pay

## 2021-06-07 NOTE — Telephone Encounter (Signed)
Contacted pt to go over lab results pt didn't answer was unable to lvm due to vm not being set up  

## 2021-06-08 ENCOUNTER — Other Ambulatory Visit: Payer: Self-pay

## 2021-06-08 ENCOUNTER — Ambulatory Visit: Payer: Medicare Other | Admitting: Pharmacist

## 2021-06-28 ENCOUNTER — Ambulatory Visit: Payer: Medicare Other | Attending: Internal Medicine | Admitting: Pharmacist

## 2021-06-28 ENCOUNTER — Encounter: Payer: Self-pay | Admitting: Pharmacist

## 2021-06-28 ENCOUNTER — Other Ambulatory Visit: Payer: Self-pay

## 2021-06-28 VITALS — BP 143/87 | HR 91 | Temp 98.6°F | Ht 61.0 in | Wt 182.2 lb

## 2021-06-28 DIAGNOSIS — Z0000 Encounter for general adult medical examination without abnormal findings: Secondary | ICD-10-CM

## 2021-06-28 DIAGNOSIS — Z23 Encounter for immunization: Secondary | ICD-10-CM

## 2021-06-28 MED ORDER — ZOSTER VAC RECOMB ADJUVANTED 50 MCG/0.5ML IM SUSR: 0.5000 mL | Freq: Once | INTRAMUSCULAR | 0 refills | Status: AC

## 2021-06-28 NOTE — Progress Notes (Addendum)
Subjective:   [name] Barker is a 60 y.o. female who presents for Medicare Annual (Subsequent) preventive examination.  Objective:    Today's Vitals   06/28/21 1517  BP: (!) 143/87  Pulse: 91  Temp: 98.6 F (37 C)  SpO2: 98%  Weight: 182 lb 3.2 oz (82.6 kg)  Height: '5\' 1"'  (1.549 m)  PainSc: 0-No pain   Body mass index is 34.43 kg/m.  Advanced Directives 06/28/2021 06/23/2020 09/28/2019 09/27/2019 10/17/2017 09/03/2017 08/22/2017  Does Patient Have a Medical Advance Directive? No No No No No No No  Type of Advance Directive - - - - - - -  Copy of Healthcare Power of Attorney in Chart? - - - - - - -  Would patient like information on creating a medical advance directive? No - Patient declined Yes (Inpatient - patient defers creating a medical advance directive at this time - Information given) No - Patient declined Yes (ED - Information included in AVS) - No - Patient declined No - Patient declined   Current Medications (verified) Outpatient Encounter Medications as of 06/28/2021  Medication Sig   amLODipine (NORVASC) 5 MG tablet Take 1 tablet (5 mg total) by mouth daily.   ASPIRIN LOW DOSE 81 MG EC tablet TAKE 1 TABLET (81 MG TOTAL) BY MOUTH DAILY.   atorvastatin (LIPITOR) 20 MG tablet Take 1.5 tablets (30 mg total) by mouth daily.   Blood Glucose Calibration (ACCU-CHEK SMARTVIEW CONTROL) LIQD Use as directed   Blood Glucose Monitoring Suppl (ACCU-CHEK NANO SMARTVIEW) w/Device KIT Use as directed for once daily testing of blood sugar. E11.9   losartan (COZAAR) 50 MG tablet TAKE 1 TABLET ONE TIME DAILY   metFORMIN (GLUCOPHAGE) 1000 MG tablet Take 1 tablet (1,000 mg total) by mouth 2 (two) times daily with a meal.   Multiple Vitamin (MULTIVITAMIN WITH MINERALS) TABS tablet Take 1 tablet by mouth daily.   Omega-3 Fatty Acids (FISH OIL) 1000 MG CAPS Take 1 capsule (1,000 mg total) by mouth daily.   tetrahydrozoline 0.05 % ophthalmic solution Place 2 drops into both eyes daily as needed (for  dry eyes).   venlafaxine XR (EFFEXOR-XR) 75 MG 24 hr capsule Take 1 capsule (75 mg total) by mouth daily with breakfast.   Zoster Vaccine Adjuvanted [facility]) injection Inject 0.5 mLs into the muscle once for 1 dose.   [DISCONTINUED] acetaminophen (TYLENOL) 325 MG tablet Take 2 tablets (650 mg total) by mouth every 6 (six) hours as needed for mild pain or headache.   No facility-administered encounter medications on file as of 06/28/2021.   Allergies (verified) Patient has no known allergies.   History: Past Medical History:  Diagnosis Date   Cataract 01/2018   BL - Dr. Gershon Crane   Diabetes mellitus Dx 1998   High cholesterol    Hip pain    Other and unspecified hyperlipidemia    Personal history of unspecified circulatory disease    S/P cardiac cath June 2008   abnormal cardiolite. LVH..question on prior studies and question septal hypertroph...not seen echo... EF 65%   Past Surgical History:  Procedure Laterality Date   CHOLECYSTECTOMY     Family History  Problem Relation Age of Onset   Hypertension Mother    Hypertension Father    Heart disease Father    Stroke Other    Diabetes Other    Arthritis Other    Hypertension Sister    Hypertension Brother    Colon cancer Neg Hx    Social History  Socioeconomic History   Marital status: Single    Spouse name: Not on file   Number of children: Not on file   Years of education: Not on file   Highest education level: Not on file  Occupational History   Not on file  Tobacco Use   Smoking status: Never   Smokeless tobacco: Never  Substance and Sexual Activity   Alcohol use: No   Drug use: No   Sexual activity: Not on file  Other Topics Concern   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    Social Determinants of Health   Financial Resource Strain: Not on file  Food Insecurity: Not on file  Transportation Needs: Not on file  Physical Activity: Not on file   Stress: Not on file  Social Connections: Not on file   Tobacco Counseling Counseling given: Not Answered   Clinical Intake:  Pre-visit preparation completed: No  Pain : No/denies pain Pain Score: 0-No pain     BMI - recorded: 34.43 Nutritional Status: BMI > 30  Obese Diabetes: Yes CBG done?: No CBG resulted in Enter/ Edit results?: No  How often do you need to have someone help you when you read instructions, pamphlets, or other written materials from your doctor or pharmacy?: 2 - Rarely  Diabetic? Yes  Interpreter Needed?: No  Information entered by :: [place]   Activities of Daily Living In your present state of health, do you have any difficulty performing the following activities: 06/28/2021  Hearing? N  Vision? N  Difficulty concentrating or making decisions? N  Walking or climbing stairs? N  Dressing or bathing? N  Doing errands, shopping? N  Preparing Food and eating ? N  Using the Toilet? N  In the past six months, have you accidently leaked urine? N  Do you have problems with loss of bowel control? N  Managing your Medications? N  Managing your Finances? N  Housekeeping or managing your Housekeeping? N  Some recent data might be hidden   Patient Care Team: Ladell Pier, MD as PCP - General (Internal Medicine)  Indicate any recent Medical Services you may have received from other than [facility] providers in the past year (date may be approximate).     Assessment:   This is a routine wellness examination for [place].  Hearing/Vision screen No results found.  Dietary issues and exercise activities discussed: Current Exercise Habits: The patient does not participate in regular exercise at present   Goals Addressed   None   Depression Screen PHQ 2/9 Scores 06/28/2021 06/01/2021 01/31/2021 06/23/2020 05/05/2020 03/03/2019 11/28/2018  PHQ - 2 Score 0 0 0 0 0 0 0  PHQ- 9 Score - - - - - - -    Fall Risk Fall Risk  06/28/2021 06/01/2021 01/31/2021 09/29/2020  06/23/2020  Falls in the past year? 0 0 0 0 0  Number falls in past yr: 0 0 0 0 -  Injury with Fall? 0 0 0 0 -  Risk for fall due to : - No Fall Risks - - -  Follow up Falls evaluation completed;Education provided;Falls prevention discussed - - - -    FALL RISK PREVENTION PERTAINING TO THE HOME:  Any stairs in or around the home? No  If so, are there any without handrails? No  Home free of loose throw rugs in walkways, pet beds, electrical cords, etc? Yes  Adequate lighting in your  home to reduce risk of falls? No   ASSISTIVE DEVICES UTILIZED TO PREVENT FALLS:  Life alert? No  Use of a cane, walker or w/c? No  Grab bars in the bathroom? No  Shower chair or bench in shower? No  Elevated toilet seat or a handicapped toilet? No   TIMED UP AND GO:  Was the test performed? Yes .  Length of time to ambulate 10 feet: 5 sec.   Gait steady and fast without use of assistive device  Cognitive Function: MMSE - Mini Mental State Exam 06/28/2021 06/23/2020  Orientation to time 5 5  Orientation to Place 5 5  Registration 3 3  Attention/ Calculation 5 4  Recall 2 1  Language- name 2 objects 2 2  Language- repeat 1 1  Language- follow 3 step command 3 3  Language- read & follow direction 1 1  Write a sentence 1 1  Copy design 1 1  Total score 29 27        Immunizations Immunization History  Administered Date(s) Administered   Influenza,inj,Quad PF,6+ Mos 10/20/2014, 09/13/2016, 10/22/2017, 08/29/2018, 11/04/2019, 09/29/2020   PFIZER(Purple Top)SARS-COV-2 Vaccination 03/10/2020, 04/05/2020, 11/14/2020   Pneumococcal Polysaccharide-23 11/08/2016   Tdap 12/20/2016   Zoster Recombinat (Shingrix) 06/23/2020    TDAP status: Up to date  Flu Vaccine status: Up to date  Pneumococcal vaccine status: Up to date  Covid-19 vaccine status: Information provided on how to obtain vaccines.   Qualifies for Shingles Vaccine? Yes   Zostavax completed No   Shingrix Completed?: Yes - [employer] will not cover in office admin. Will send as a rx for pt to complete at her pharmacy.  Screening Tests Health Maintenance  Topic Date Due   Zoster Vaccines- Shingrix (2 of 2) 08/18/2020   FOOT EXAM  02/03/2021   PAP SMEAR-Modifier  02/27/2021   COVID-19 Vaccine (4 - Booster for Pfizer series) 03/14/2021   INFLUENZA VACCINE  07/24/2021   HEMOGLOBIN A1C  12/01/2021   OPHTHALMOLOGY EXAM  01/17/2022   MAMMOGRAM  05/11/2023   COLONOSCOPY (Pts 45-39yr Insurance coverage will need to be confirmed)  03/23/2024   TETANUS/TDAP  12/20/2026   PNEUMOCOCCAL POLYSACCHARIDE VACCINE AGE 15-64 HIGH RISK  Completed   Hepatitis C Screening  Completed   HIV Screening  Completed   Pneumococcal Vaccine 071630Years old  Aged Out   HPV VFloydMaintenance Due  Topic Date Due   Zoster Vaccines- Shingrix (2 of 2) 08/18/2020   FOOT EXAM  02/03/2021   PAP SMEAR-Modifier  02/27/2021   COVID-19 Vaccine (4 - Booster for Pfizer series) 03/14/2021    Colorectal cancer screening: Type of screening: Colonoscopy. Completed 2015. Repeat every 10 years  Mammogram status: Completed 05/10/2021. Repeat every year  BMD: not eligible until 61YO. Low fall risk. No fractures/   Lung Cancer Screening: (Low Dose CT Chest recommended if Age 61-80years, 30 pack-year currently smoking OR have quit w/in 15years.) does not qualify.   Lung Cancer Screening Referral: not appropriate  Additional Screening:  Hepatitis C Screening: does qualify; Completed 2020.  Vision Screening: Recommended annual ophthalmology exams for early detection of glaucoma and other disorders of the eye. Is the patient up to date with their annual eye exam?  Yes  Who is the provider or what is the name of the office in which the patient attends annual eye exams? Dr. SGershon Crane Dental Screening: Recommended annual dental exams for proper oral  hygiene  Community Resource Referral / Chronic Care  Management: CRR required this visit?  No   CCM required this visit?  No      Plan:     I have personally reviewed and noted the following in the patient's chart:   Medical and social history Use of alcohol, tobacco or illicit drugs  Current medications and supplements including opioid prescriptions.  Functional ability and status Nutritional status Physical activity Advanced directives List of other physicians Hospitalizations, surgeries, and ER visits in previous 12 months Vitals Screenings to include cognitive, depression, and falls Referrals and appointments  In addition, I have reviewed and discussed with patient certain preventive protocols, quality metrics, and best practice recommendations. A written personalized care plan for preventive services as well as general preventive health recommendations were provided to patient.  [place], RPH-CPP   06/28/2021

## 2021-08-01 ENCOUNTER — Telehealth: Payer: Self-pay | Admitting: Internal Medicine

## 2021-08-01 NOTE — Telephone Encounter (Signed)
No medication listed in message. Tried calling [facility] and no answer lvm

## 2021-08-01 NOTE — Telephone Encounter (Signed)
Christy w/ [facility] calling to advise pt's insurance will only pay for 1 tab /day. So dr could submit a prior auth   OR Prescribe a script 10 mg day and another for 20mg  with instructions on ea for 1/day   [facility] sent fax 8/04 with no response.   10/04  Ref no.  [phone]

## 2021-08-09 NOTE — Telephone Encounter (Signed)
[name] is referring to atorvastatin 1.5 tablet 20 mg was written and insurance will not pay for 1.5 tablet and they will  pay for 10 mg once a day and 20 mg of atorvastatin once a day which will be 30 mg once a day

## 2021-08-10 MED ORDER — ATORVASTATIN CALCIUM 20 MG PO TABS: 20.0000 mg | ORAL_TABLET | Freq: Every day | ORAL | 0 refills | Status: DC

## 2021-08-10 MED ORDER — ATORVASTATIN CALCIUM 10 MG PO TABS: 10.0000 mg | ORAL_TABLET | Freq: Every day | ORAL | 0 refills | Status: DC

## 2021-08-10 NOTE — Telephone Encounter (Signed)
Rx sent 

## 2021-08-10 NOTE — Telephone Encounter (Signed)
Franky Macho could you send updated rx

## 2021-09-11 ENCOUNTER — Other Ambulatory Visit: Payer: Self-pay | Admitting: Internal Medicine

## 2021-09-11 DIAGNOSIS — N951 Menopausal and female climacteric states: Secondary | ICD-10-CM

## 2021-10-02 ENCOUNTER — Ambulatory Visit: Payer: Medicare Other | Attending: Internal Medicine | Admitting: Internal Medicine

## 2021-10-02 ENCOUNTER — Other Ambulatory Visit: Payer: Self-pay

## 2021-10-02 VITALS — BP 149/78 | HR 95 | Resp 16 | Wt 181.4 lb

## 2021-10-02 DIAGNOSIS — I10 Essential (primary) hypertension: Secondary | ICD-10-CM | POA: Diagnosis not present

## 2021-10-02 DIAGNOSIS — E785 Hyperlipidemia, unspecified: Secondary | ICD-10-CM

## 2021-10-02 DIAGNOSIS — E1169 Type 2 diabetes mellitus with other specified complication: Secondary | ICD-10-CM | POA: Diagnosis not present

## 2021-10-02 DIAGNOSIS — Z23 Encounter for immunization: Secondary | ICD-10-CM | POA: Diagnosis not present

## 2021-10-02 DIAGNOSIS — Z6834 Body mass index (BMI) 34.0-34.9, adult: Secondary | ICD-10-CM

## 2021-10-02 DIAGNOSIS — E669 Obesity, unspecified: Secondary | ICD-10-CM | POA: Diagnosis not present

## 2021-10-02 MED ORDER — ACCU-CHEK GUIDE VI STRP: ORAL_STRIP | 12 refills | Status: AC

## 2021-10-02 MED ORDER — ACCU-CHEK GUIDE W/DEVICE KIT: PACK | 0 refills | Status: AC

## 2021-10-02 MED ORDER — ACCU-CHEK SOFTCLIX LANCETS MISC: 12 refills | Status: AC

## 2021-10-02 MED ORDER — AMLODIPINE BESYLATE 10 MG PO TABS: 10.0000 mg | ORAL_TABLET | Freq: Every day | ORAL | 3 refills | Status: DC

## 2021-10-02 NOTE — Progress Notes (Signed)
Patient ID: [name] Barker, female    DOB: 07-16-1961  MRN: [id number]  CC: Diabetes and Hypertension   Subjective: [name] Barker is a 60 y.o. female who presents for chronic ds management Her concerns today include:  Patient with history of DM with microalbumin, HL, HTN, perimenopausal (On Effexor), anemia chronic ds, insomnia, CKD stage 3, BL complicated renal cyst, COVID pneumonia.    DIABETES TYPE 2 Last A1C:   Lab Results  Component Value Date   HGBA1C 6.2 06/01/2021  Med Adherence:  '[x]'  Yes - on Metformin only.  Amaryl was stopped on last visit due to hypoglycemic unawareness. Medication side effects:  '[]'  Yes    '[x]'  No Home Monitoring?  '[]'  Yes    '[x]'  No needs new meter Home glucose results range: Diet Adherence: '[x]'  Yes  -eating more veggies.  No fried foods.  Drinks diet sodas Exercise: '[x]'  Yes -tries to walk every day  '[]'  No Hypoglycemic episodes?: '[]'  Yes    '[]'  No Numbness of the feet? '[x]'  Yes - in hands and feet intermittently.  Occurs if she sits for too long    '[]'  No Retinopathy hx? '[]'  Yes    '[]'  No Last eye exam: due again 12/2021 Comments:   HYPERTENSION Currently taking: see medication list. Med Adherence: '[x]'  Yes - Norvasc and Cozaar and took already for today    '[]'  No Medication side effects: '[]'  Yes    '[x]'  No Adherence with salt restriction: '[x]'  Yes    '[]'  No Home Monitoring?: '[x]'  Yes    '[]'  No Monitoring Frequency: occasionally Home BP results range:  140-150s for SBP SOB? '[]'  Yes    '[x]'  No Chest Pain?: '[]'  Yes    '[x]'  No Leg swelling?: '[]'  Yes    '[x]'  No Headaches?: '[]'  Yes    '[x]'  No Dizziness? '[]'  Yes    '[x]'  No Comments:   HL:  taking total of 30 mf Lipitor   CKD 3: last saw kidney doctor about 6 mths ago.  Not on any NSAIDs.  She still makes good urine.  Due for creatinine and GFR checked today.   Patient Active Problem List   Diagnosis Date Noted   Class 1 obesity due to excess calories with serious comorbidity and body mass index (BMI) of 33.0 to 33.9  in adult 06/01/2021   CKD (chronic kidney disease) stage 3, GFR 30-59 ml/min ([place]) 11/30/2020   Raynaud's phenomenon without gangrene 09/29/2020   Anemia in stage 3a chronic kidney disease ([place]) 09/29/2020   Anemia, chronic disease 02/07/2020   COVID-19 virus infection 09/28/2019   Type II diabetes mellitus with renal manifestations ([place]) 09/28/2019   HTN (hypertension) 09/28/2019   HLD (hyperlipidemia) 09/28/2019   Insomnia due to medical condition 09/13/2016   Perimenopausal 09/13/2016   Hyperlipidemia associated with type 2 diabetes mellitus ([place]) 07/22/2014   Esophageal reflux 07/22/2014   Diabetes ([place]) 04/22/2014   Essential hypertension, benign 04/22/2014     Current Outpatient Medications on File Prior to Visit  Medication Sig Dispense Refill   ASPIRIN LOW DOSE 81 MG EC tablet TAKE 1 TABLET (81 MG TOTAL) BY MOUTH DAILY. 90 tablet 6   atorvastatin (LIPITOR) 10 MG tablet TAKE 1 TABLET (10 MG TOTAL) BY MOUTH DAILY. TAKE ALONG  WITH ONE 20 MG TABLET FOR A TOTAL DOSE OF 30 MG DAILY. 90 tablet 0   atorvastatin (LIPITOR) 20 MG tablet TAKE 1 TABLET (20 MG TOTAL) BY MOUTH DAILY. TAKE ALONG  WITH ONE 10 MG TABLET  FOR A TOTAL DOSE OF 30 MG DAILY. 90 tablet 0   losartan (COZAAR) 50 MG tablet TAKE 1 TABLET ONE TIME DAILY 90 tablet 3   metFORMIN (GLUCOPHAGE) 1000 MG tablet Take 1 tablet (1,000 mg total) by mouth 2 (two) times daily with a meal. 180 tablet 3   Multiple Vitamin (MULTIVITAMIN WITH MINERALS) TABS tablet Take 1 tablet by mouth daily. 100 tablet 0   Omega-3 Fatty Acids (FISH OIL) 1000 MG CAPS Take 1 capsule (1,000 mg total) by mouth daily. 100 capsule 1   tetrahydrozoline 0.05 % ophthalmic solution Place 2 drops into both eyes daily as needed (for dry eyes).     venlafaxine XR (EFFEXOR-XR) 75 MG 24 hr capsule TAKE 1 CAPSULE BY MOUTH  DAILY WITH BREAKFAST 90 capsule 0   No current facility-administered medications on file prior to visit.    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    Social Determinants of Health   Financial Resource Strain: Not on file  Food Insecurity: Not on file  Transportation Needs: Not on file  Physical Activity: Not on file  Stress: Not on file  Social Connections: Not on file  Intimate Partner Violence: Not on file    Family History  Problem Relation Age of Onset   Hypertension Mother    Hypertension Father    Heart disease Father    Stroke Other    Diabetes Other    Arthritis Other    Hypertension Sister    Hypertension Brother    Colon cancer Neg Hx     Past Surgical History:  Procedure Laterality Date   CHOLECYSTECTOMY      ROS: Review of Systems Negative except as stated above  PHYSICAL EXAM: BP (!) 149/78   Pulse 95   Resp 16   Wt 181 lb 6.4 oz (82.3 kg)   LMP 02/21/2016   SpO2 96%   BMI 34.28 kg/m   Wt Readings from Last 3 Encounters:  10/02/21 181 lb 6.4 oz (82.3 kg)  06/28/21 182 lb 3.2 oz (82.6 kg)  06/01/21 181 lb 6.4 oz (82.3 kg)    Physical Exam  General appearance - alert, well appearing, obese older African-American female and in no distress Mental status - normal mood, behavior, speech, dress, motor activity, and thought processes Neck - supple, no significant adenopathy Chest - clear to auscultation, no wheezes, rales or rhonchi, symmetric air entry Heart - normal rate, regular rhythm, normal S1, S2, no murmurs, rubs, clicks or gallops Extremities - peripheral pulses normal, no pedal  edema, no clubbing or cyanosis   CMP Latest Ref Rng & Units 09/29/2020 02/16/2020 11/04/2019  Glucose 65 - 99 mg/dL 89 105(H) 52(L)  BUN 6 - 24 mg/dL '17 14 14  ' Creatinine 0.57 - 1.00 mg/dL 1.35(H) 1.35(H) 1.20(H)  Sodium 134 - 144 mmol/L 142 139 140  Potassium  3.5 - 5.2 mmol/L 4.3 4.6 4.5  Chloride 96 - 106 mmol/L 104 102 104  CO2 20 - 29 mmol/L '23 21 21  ' Calcium 8.7 - 10.2 mg/dL 9.5 9.9 9.7  Total Protein 6.0 - 8.5 g/dL 7.4 - 7.2  Total Bilirubin 0.0 - 1.2 mg/dL 0.5 - 0.3  Alkaline Phos 44 - 121 IU/L 87 - 100  AST 0 - 40 IU/L 20 - 12  ALT 0 - 32 IU/L 23 - 13   Lipid Panel     Component Value Date/Time   CHOL 177 02/03/2021 1055   TRIG 135 02/03/2021 1055   HDL 43 02/03/2021 1055   CHOLHDL 4.1 02/03/2021 1055   CHOLHDL 2.9 09/13/2016 0949   VLDL 23 09/13/2016 0949   LDLCALC 110 (H) 02/03/2021 1055    CBC    Component Value Date/Time   WBC 6.5 02/04/2020 1434   WBC 12.2 (H) 10/02/2019 0335   RBC 3.86 02/04/2020 1434   RBC 3.65 (L) 10/02/2019 0335   HGB 10.8 (L) 02/04/2020 1434   HCT 33.4 (L) 02/04/2020 1434   PLT 314 02/04/2020 1434   MCV 87 02/04/2020 1434   MCH 28.0 02/04/2020 1434   MCH 28.2 10/02/2019 0335   MCHC 32.3 02/04/2020 1434   MCHC 32.8 10/02/2019 0335   RDW 14.4 02/04/2020 1434   LYMPHSABS 2.8 11/04/2019 1001   MONOABS 1.0 10/02/2019 0335   EOSABS 0.5 (H) 11/04/2019 1001   BASOSABS 0.1 11/04/2019 1001    ASSESSMENT AND PLAN: 1. Type 2 diabetes mellitus with obesity (HCC) Level of control unknown as patient has not been checking blood sugars.  Prescription sent to her pharmacy for diabetic testing supplies.  Continue metformin, healthy eating habits and regular exercise.  We will check A1c today. - CBC - Comprehensive metabolic panel - Hemoglobin A1c - Accu-Chek Softclix Lancets lancets; Use as instructed  Dispense: 100 each; Refill: 12 - glucose blood (ACCU-CHEK GUIDE) test strip; Use as instructed  Dispense: 100 each; Refill: 12 - Blood Glucose  Monitoring Suppl (ACCU-CHEK GUIDE) w/Device KIT; UAD  Dispense: 1 kit; Refill: 0  2. Essential hypertension Not at goal.  Increase amlodipine to 10 mg daily.  Continue Cozaar - amLODipine (NORVASC) 10 MG tablet; Take 1 tablet (10 mg total) by mouth daily.  Dispense: 90 tablet; Refill: 3  3. Hyperlipidemia associated with type 2 diabetes mellitus (HCC) Continue atorvastatin.  Check lipid profile today - Lipid panel  4. Need for influenza vaccination Given today by CMA Sallyanne Havers  We will plan to give the second shingles vaccine on her follow-up with me in 6 weeks for Pap smear..    Patient was given the opportunity to ask questions.  Patient verbalized understanding of the plan and was able to repeat key elements of the plan.   Orders Placed This Encounter  Procedures   CBC   Comprehensive metabolic panel   Lipid panel   Hemoglobin A1c     Requested Prescriptions   Signed Prescriptions Disp Refills   amLODipine (NORVASC) 10 MG tablet 90 tablet 3    Sig: Take 1 tablet (10 mg total) by mouth daily.   Accu-Chek Softclix Lancets lancets 100 each 12    Sig: Use as instructed   glucose blood (ACCU-CHEK GUIDE) test strip 100 each 12    Sig: Use as instructed   Blood Glucose Monitoring Suppl (ACCU-CHEK GUIDE) w/Device KIT 1 kit 0    Sig: UAD    Return in about 6 weeks (around 11/13/2021) for  pap.  Karle Plumber, MD, FACP

## 2021-10-03 LAB — COMPREHENSIVE METABOLIC PANEL
ALT: 20 IU/L (ref 0–32)
AST: 20 IU/L (ref 0–40)
Albumin/Globulin Ratio: 2.1 (ref 1.2–2.2)
Albumin: 5 g/dL — ABNORMAL HIGH (ref 3.8–4.9)
Alkaline Phosphatase: 99 IU/L (ref 44–121)
BUN/Creatinine Ratio: 16 (ref 12–28)
BUN: 20 mg/dL (ref 8–27)
Bilirubin Total: 0.5 mg/dL (ref 0.0–1.2)
CO2: 24 mmol/L (ref 20–29)
Calcium: 10.2 mg/dL (ref 8.7–10.3)
Chloride: 103 mmol/L (ref 96–106)
Creatinine, Ser: 1.29 mg/dL — ABNORMAL HIGH (ref 0.57–1.00)
Globulin, Total: 2.4 g/dL (ref 1.5–4.5)
Glucose: 113 mg/dL — ABNORMAL HIGH (ref 70–99)
Potassium: 4.8 mmol/L (ref 3.5–5.2)
Sodium: 142 mmol/L (ref 134–144)
Total Protein: 7.4 g/dL (ref 6.0–8.5)
eGFR: 48 mL/min/{1.73_m2} — ABNORMAL LOW (ref >59)

## 2021-10-03 LAB — CBC
Hematocrit: 34.5 % (ref 34.0–46.6)
Hemoglobin: 11.6 g/dL (ref 11.1–15.9)
MCH: 29 pg (ref 26.6–33.0)
MCHC: 33.6 g/dL (ref 31.5–35.7)
MCV: 86 fL (ref 79–97)
Platelets: 289 10*3/uL (ref 150–450)
RBC: 4 x10E6/uL (ref 3.77–5.28)
RDW: 13.7 % (ref 11.7–15.4)
WBC: 7.9 10*3/uL (ref 3.4–10.8)

## 2021-10-03 LAB — HEMOGLOBIN A1C
Est. average glucose Bld gHb Est-mCnc: 163 mg/dL
Hgb A1c MFr Bld: 7.3 % — ABNORMAL HIGH (ref 4.8–5.6)

## 2021-10-03 LAB — LIPID PANEL
Chol/HDL Ratio: 3.2 ratio (ref 0.0–4.4)
Cholesterol, Total: 168 mg/dL (ref 100–199)
HDL: 53 mg/dL (ref >39)
LDL Chol Calc (NIH): 84 mg/dL (ref 0–99)
Triglycerides: 182 mg/dL — ABNORMAL HIGH (ref 0–149)
VLDL Cholesterol Cal: 31 mg/dL (ref 5–40)

## 2021-10-03 NOTE — Progress Notes (Signed)
Blood cell counts normal. Kidney function not 100% but stable. Liver function tests normal. LDL cholesterol 84 with goal being less than 70.  It has improved from February of this year when it was 110.  Continue atorvastatin. A1c is 7.3 with goal being less than 7.  Continue metformin, healthy eating habits and regular exercise.

## 2021-10-05 ENCOUNTER — Telehealth: Payer: Self-pay

## 2021-10-05 NOTE — Telephone Encounter (Signed)
Contacted pt to go over lab results pt is aware and doesn't have any questions or concerns 

## 2021-11-12 ENCOUNTER — Other Ambulatory Visit: Payer: Self-pay | Admitting: Internal Medicine

## 2021-11-12 DIAGNOSIS — E1169 Type 2 diabetes mellitus with other specified complication: Secondary | ICD-10-CM

## 2021-11-12 NOTE — Telephone Encounter (Signed)
[facility] closed

## 2021-11-13 NOTE — Telephone Encounter (Signed)
Requested medication (s) are due for refill today:   No not on med list  Requested medication (s) are on the active medication list:   No  Future visit scheduled:   Yes tomorrow with Dr. Laural Benes 11/22   Last ordered: This was discontinued 06/01/2021 by Dr. Laural Benes.  Returned because a refill request was sent.     Requested Prescriptions  Pending Prescriptions Disp Refills   glimepiride (AMARYL) 2 MG tablet [Pharmacy Med Name: Glimepiride 2 MG Oral Tablet] 45 tablet 3    Sig: TAKE ONE-HALF TABLET BY  MOUTH DAILY WITH BREAKFAST     Endocrinology:  Diabetes - Sulfonylureas Passed - 11/12/2021  6:37 AM      Passed - HBA1C is between 0 and 7.9 and within 180 days    HbA1c, POC (prediabetic range)  Date Value Ref Range Status  09/29/2020 6.4 5.7 - 6.4 % Final   HbA1c, POC (controlled diabetic range)  Date Value Ref Range Status  06/01/2021 6.2 0.0 - 7.0 % Final   Hgb A1c MFr Bld  Date Value Ref Range Status  10/02/2021 7.3 (H) 4.8 - 5.6 % Final    Comment:             Prediabetes: 5.7 - 6.4          Diabetes: >6.4          Glycemic control for adults with diabetes: <7.0           Passed - Valid encounter within last 6 months    Recent Outpatient Visits           1 month ago Type 2 diabetes mellitus with obesity (HCC)   [facility] Jonah Blue B, MD   4 months ago Need for shingles vaccine   [facility] [place], Jeannett Senior L, RPH-CPP   5 months ago Type 2 diabetes mellitus with hypoglycemia without coma, without long-term current use of insulin [facility])   [place] [facility] Jonah Blue B, MD   9 months ago Type 2 diabetes mellitus with obesity [facility])   [place] [facility] Marcine Matar, MD   1 year ago Need for influenza vaccination   [facility] Lois Huxley, Cornelius Moras, RPH-CPP       Future Appointments             Tomorrow  Marcine Matar, MD [facility]

## 2021-11-14 ENCOUNTER — Ambulatory Visit: Payer: Medicare Other | Attending: Internal Medicine | Admitting: Internal Medicine

## 2021-11-14 ENCOUNTER — Other Ambulatory Visit: Payer: Self-pay

## 2021-11-14 ENCOUNTER — Other Ambulatory Visit (HOSPITAL_COMMUNITY)
Admission: RE | Admit: 2021-11-14 | Discharge: 2021-11-14 | Disposition: A | Discharge status: Home / Self Care | Payer: Medicare Other | Source: Ambulatory Visit | Attending: Internal Medicine | Admitting: Internal Medicine

## 2021-11-14 ENCOUNTER — Encounter: Payer: Self-pay | Admitting: Internal Medicine

## 2021-11-14 VITALS — BP 138/82 | HR 100 | Resp 16 | Wt 181.2 lb

## 2021-11-14 DIAGNOSIS — Z23 Encounter for immunization: Secondary | ICD-10-CM

## 2021-11-14 DIAGNOSIS — Z01419 Encounter for gynecological examination (general) (routine) without abnormal findings: Secondary | ICD-10-CM | POA: Diagnosis present

## 2021-11-14 DIAGNOSIS — Z124 Encounter for screening for malignant neoplasm of cervix: Secondary | ICD-10-CM | POA: Diagnosis not present

## 2021-11-14 DIAGNOSIS — Z1151 Encounter for screening for human papillomavirus (HPV): Secondary | ICD-10-CM | POA: Diagnosis not present

## 2021-11-14 NOTE — Progress Notes (Signed)
Patient ID: [name] Barker, female    DOB: [birth year]  MRN: [id number]  CC: Gynecologic Exam   Subjective: Pedro Oldenburg is a 60 y.o. female who presents for PAP Her concerns today include:  Patient with history of DM with microalbumin, HL, HTN, perimenopausal (On Effexor), anemia chronic ds, insomnia, CKD stage 3, BL complicated renal cyst, COVID pneumonia.    GYN History:  Pt is G0P0 Any hx of abn paps?: no Menses regular or irregular?: menopausal How long does menses last? NA Menstrual flow light or heavy?: NA Method of birth control?:  NA Any vaginal dischg at this time?: No Dysuria?: No Any hx of STI?: no Sexually active with how many partners: no Desires STI screen: no Last MMG: 04/2021 Family hx of uterine, cervical or breast cancer?:  no  Had 2nd Shingrix shot 11/03/2021 at [place] [address].  She has slip with her.  Due for PCV 15.  Agrees to receive today   Patient Active Problem List   Diagnosis Date Noted   Class 1 obesity due to excess calories with serious comorbidity and body mass index (BMI) of 33.0 to 33.9 in adult 06/01/2021   CKD (chronic kidney disease) stage 3, GFR 30-59 ml/min ([place]) 11/30/2020   Raynaud's phenomenon without gangrene 09/29/2020   Anemia in stage 3a chronic kidney disease ([place]) 09/29/2020   Anemia, chronic disease 02/07/2020   COVID-19 virus infection 09/28/2019   Type II diabetes mellitus with renal manifestations ([place]) 09/28/2019   HTN (hypertension) 09/28/2019   HLD (hyperlipidemia) 09/28/2019   Insomnia due to medical condition 09/13/2016   Perimenopausal 09/13/2016   Hyperlipidemia associated with type 2 diabetes mellitus ([place]) 07/22/2014   Esophageal reflux 07/22/2014   Diabetes ([place]) 04/22/2014   Essential hypertension, benign 04/22/2014     Current Outpatient Medications on File Prior to Visit  Medication Sig Dispense Refill   Accu-Chek Softclix Lancets lancets Use as instructed 100 each 12   amLODipine (NORVASC)  10 MG tablet Take 1 tablet (10 mg total) by mouth daily. 90 tablet 3   ASPIRIN LOW DOSE 81 MG EC tablet TAKE 1 TABLET (81 MG TOTAL) BY MOUTH DAILY. 90 tablet 6   atorvastatin (LIPITOR) 10 MG tablet TAKE 1 TABLET (10 MG TOTAL) BY MOUTH DAILY. TAKE ALONG  WITH ONE 20 MG TABLET FOR A TOTAL DOSE OF 30 MG DAILY. 90 tablet 0   atorvastatin (LIPITOR) 20 MG tablet TAKE 1 TABLET (20 MG TOTAL) BY MOUTH DAILY. TAKE ALONG  WITH ONE 10 MG TABLET FOR A TOTAL DOSE OF 30 MG DAILY. 90 tablet 0   Blood Glucose Monitoring Suppl (ACCU-CHEK GUIDE) w/Device KIT UAD 1 kit 0   glucose blood (ACCU-CHEK GUIDE) test strip Use as instructed 100 each 12   losartan (COZAAR) 50 MG tablet TAKE 1 TABLET ONE TIME DAILY 90 tablet 3   metFORMIN (GLUCOPHAGE) 1000 MG tablet Take 1 tablet (1,000 mg total) by mouth 2 (two) times daily with a meal. 180 tablet 3   Multiple Vitamin (MULTIVITAMIN WITH MINERALS) TABS tablet Take 1 tablet by mouth daily. 100 tablet 0   Omega-3 Fatty Acids (FISH OIL) 1000 MG CAPS Take 1 capsule (1,000 mg total) by mouth daily. 100 capsule 1   tetrahydrozoline 0.05 % ophthalmic solution Place 2 drops into both eyes daily as needed (for dry eyes).     venlafaxine XR (EFFEXOR-XR) 75 MG 24 hr capsule TAKE 1 CAPSULE BY MOUTH  DAILY WITH BREAKFAST 90 capsule 0   No current facility-administered  medications on file prior to visit.    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    Social Determinants of Health    Financial Resource Strain: Not on file  Food Insecurity: Not on file  Transportation Needs: Not on file  Physical Activity: Not on file  Stress: Not on file  Social Connections: Not on file  Intimate Partner Violence: Not on file    Family History  Problem Relation Age of Onset   Hypertension Mother    Hypertension Father    Heart disease Father    Stroke Other    Diabetes Other    Arthritis Other    Hypertension Sister    Hypertension Brother    Colon cancer Neg Hx     Past Surgical History:  Procedure Laterality Date   CHOLECYSTECTOMY      ROS: Review of Systems Negative except as stated above  PHYSICAL EXAM: BP 138/82   Pulse 100   Resp 16   Wt 181 lb 3.2 oz (82.2 kg)   LMP 02/21/2016   SpO2 98%   BMI 34.24 kg/m   Physical Exam  General appearance - alert, well appearing, and in no distress Mental status - normal mood, behavior, speech, dress, motor activity, and thought processes Pelvic -CMA [place] present: Normal external genitalia, vulva, vagina, cervix, uterus and adnexa   CMP Latest Ref Rng & Units 10/02/2021 09/29/2020 02/16/2020  Glucose 70 - 99 mg/dL 113(H) 89 105(H)  BUN 8 - 27 mg/dL '20 17 14  ' Creatinine 0.57 - 1.00 mg/dL 1.29(H) 1.35(H) 1.35(H)  Sodium 134 - 144 mmol/L 142 142 139  Potassium 3.5 - 5.2 mmol/L 4.8 4.3 4.6  Chloride 96 - 106 mmol/L 103 104 102  CO2 20 - 29 mmol/L '24 23 21  ' Calcium 8.7 - 10.3 mg/dL 10.2 9.5 9.9  Total Protein 6.0 - 8.5 g/dL 7.4 7.4 -  Total Bilirubin 0.0 - 1.2 mg/dL 0.5 0.5 -  Alkaline Phos 44 - 121 IU/L 99 87 -  AST 0 - 40 IU/L 20 20 -  ALT 0 - 32 IU/L 20 23 -   Lipid Panel     Component Value Date/Time   CHOL 168 10/02/2021 1211   TRIG 182 (H) 10/02/2021 1211   HDL 53 10/02/2021 1211   CHOLHDL 3.2 10/02/2021 1211   CHOLHDL 2.9 09/13/2016 0949   VLDL 23 09/13/2016 0949   LDLCALC 84 10/02/2021 1211    CBC    Component Value Date/Time   WBC 7.9 10/02/2021 1211   WBC 12.2 (H) 10/02/2019 0335    RBC 4.00 10/02/2021 1211   RBC 3.65 (L) 10/02/2019 0335   HGB 11.6 10/02/2021 1211   HCT 34.5 10/02/2021 1211   PLT 289 10/02/2021 1211   MCV 86 10/02/2021 1211   MCH 29.0 10/02/2021 1211   MCH 28.2 10/02/2019 0335   MCHC  33.6 10/02/2021 1211   MCHC 32.8 10/02/2019 0335   RDW 13.7 10/02/2021 1211   LYMPHSABS 2.8 11/04/2019 1001   MONOABS 1.0 10/02/2019 0335   EOSABS 0.5 (H) 11/04/2019 1001   BASOSABS 0.1 11/04/2019 1001    ASSESSMENT AND PLAN: 1. Pap smear for cervical cancer screening - Cytology - PAP  2. Need for vaccination against Streptococcus pneumoniae Patient given PCV 15 today.    Patient was given the opportunity to ask questions.  Patient verbalized understanding of the plan and was able to repeat key elements of the plan.   No orders of the defined types were placed in this encounter.    Requested Prescriptions    No prescriptions requested or ordered in this encounter    No follow-ups on file.  Karle Plumber, MD, FACP

## 2021-11-20 LAB — CYTOLOGY - PAP
Comment: NEGATIVE
Diagnosis: NEGATIVE
High risk HPV: NEGATIVE

## 2021-11-22 ENCOUNTER — Telehealth: Payer: Self-pay

## 2021-11-22 NOTE — Telephone Encounter (Signed)
Contacted pt to go over lab results pt wasn't home at the time of call   Sent a CRM and forward labs to NT to give pt labs when they call back

## 2021-12-06 ENCOUNTER — Encounter: Payer: Self-pay | Admitting: Internal Medicine

## 2021-12-06 ENCOUNTER — Other Ambulatory Visit: Payer: Self-pay | Admitting: Internal Medicine

## 2021-12-06 NOTE — Progress Notes (Signed)
Patient seen by her nephrologist Dr. Marjory Sneddon 11/22/2021. Patient with CKD stage IIIa.  Farxiga 10 mg added.

## 2021-12-29 ENCOUNTER — Other Ambulatory Visit: Payer: Self-pay | Admitting: Internal Medicine

## 2021-12-29 DIAGNOSIS — N951 Menopausal and female climacteric states: Secondary | ICD-10-CM

## 2021-12-29 NOTE — Telephone Encounter (Signed)
Requested Prescriptions  Pending Prescriptions Disp Refills   venlafaxine XR (EFFEXOR-XR) 75 MG 24 hr capsule [Pharmacy Med Name: Venlafaxine HCl ER 75 MG Oral Capsule Extended Release 24 Hour] 90 capsule 2    Sig: TAKE 1 CAPSULE BY MOUTH  DAILY WITH BREAKFAST     Psychiatry: Antidepressants - SNRI - desvenlafaxine & venlafaxine Failed - 12/29/2021 10:43 PM      Failed - Triglycerides in normal range and within 360 days    Triglycerides  Date Value Ref Range Status  10/02/2021 182 (H) 0 - 149 mg/dL Final         Passed - LDL in normal range and within 360 days    LDL Chol Calc (NIH)  Date Value Ref Range Status  10/02/2021 84 0 - 99 mg/dL Final         Passed - Total Cholesterol in normal range and within 360 days    Cholesterol, Total  Date Value Ref Range Status  10/02/2021 168 100 - 199 mg/dL Final         Passed - Last BP in normal range    BP Readings from Last 1 Encounters:  11/14/21 138/82         Passed - Valid encounter within last 6 months    Recent Outpatient Visits          1 month ago Pap smear for cervical cancer screening   [place] Karle Plumber B, MD   2 months ago Type 2 diabetes mellitus with obesity [facility])   [place], Deborah B, MD   6 months ago Need for shingles vaccine   [place], Stephen L, RPH-CPP   7 months ago Type 2 diabetes mellitus with hypoglycemia without coma, without long-term current use of insulin [facility])   [place], MD   11 months ago Type 2 diabetes mellitus with obesity [facility])   [place], MD      Future Appointments            In 2 months Wynetta Emery [name], MD [place]

## 2022-01-03 DIAGNOSIS — N1831 Chronic kidney disease, stage 3a: Secondary | ICD-10-CM | POA: Diagnosis not present

## 2022-01-23 DIAGNOSIS — H40003 Preglaucoma, unspecified, bilateral: Secondary | ICD-10-CM | POA: Diagnosis not present

## 2022-01-23 DIAGNOSIS — H25813 Combined forms of age-related cataract, bilateral: Secondary | ICD-10-CM | POA: Diagnosis not present

## 2022-01-23 DIAGNOSIS — E1136 Type 2 diabetes mellitus with diabetic cataract: Secondary | ICD-10-CM | POA: Diagnosis not present

## 2022-02-05 ENCOUNTER — Other Ambulatory Visit: Payer: Self-pay | Admitting: Internal Medicine

## 2022-02-05 DIAGNOSIS — I10 Essential (primary) hypertension: Secondary | ICD-10-CM

## 2022-02-05 DIAGNOSIS — E669 Obesity, unspecified: Secondary | ICD-10-CM

## 2022-02-05 DIAGNOSIS — E1169 Type 2 diabetes mellitus with other specified complication: Secondary | ICD-10-CM

## 2022-03-15 ENCOUNTER — Other Ambulatory Visit: Payer: Self-pay

## 2022-03-15 ENCOUNTER — Encounter: Payer: Self-pay | Admitting: Internal Medicine

## 2022-03-15 ENCOUNTER — Ambulatory Visit: Payer: Medicare Other | Attending: Internal Medicine | Admitting: Internal Medicine

## 2022-03-15 VITALS — BP 128/81 | HR 90 | Resp 16 | Wt 177.8 lb

## 2022-03-15 DIAGNOSIS — I10 Essential (primary) hypertension: Secondary | ICD-10-CM | POA: Diagnosis not present

## 2022-03-15 DIAGNOSIS — E1169 Type 2 diabetes mellitus with other specified complication: Secondary | ICD-10-CM | POA: Diagnosis not present

## 2022-03-15 DIAGNOSIS — E785 Hyperlipidemia, unspecified: Secondary | ICD-10-CM

## 2022-03-15 DIAGNOSIS — E669 Obesity, unspecified: Secondary | ICD-10-CM | POA: Diagnosis not present

## 2022-03-15 DIAGNOSIS — N951 Menopausal and female climacteric states: Secondary | ICD-10-CM | POA: Diagnosis not present

## 2022-03-15 LAB — POCT GLYCOSYLATED HEMOGLOBIN (HGB A1C): HbA1c, POC (controlled diabetic range): 6.9 % (ref 0.0–7.0)

## 2022-03-15 LAB — GLUCOSE, POCT (MANUAL RESULT ENTRY): POC Glucose: 119 mg/dl — AB (ref 70–99)

## 2022-03-15 MED ORDER — ATORVASTATIN CALCIUM 20 MG PO TABS: 20.0000 mg | ORAL_TABLET | Freq: Every day | ORAL | 3 refills | Status: DC

## 2022-03-15 NOTE — Progress Notes (Signed)
? ? ?Patient ID: [name] Barker, female    DOB: [birth year]  MRN: [id number] ? ?CC: Diabetes and Hypertension ? ? ?Subjective: ?[name] Barker is a 61 y.o. female who presents for chronic disease management ?Her concerns today include:  ?Patient with history of DM with microalbumin, HL, HTN, perimenopausal (On Effexor), anemia chronic ds, insomnia, CKD stage 3, BL complicated renal cyst, COVID pneumonia ? ?DM ?Results for orders placed or performed in visit on 03/15/22  ?POCT glucose (manual entry)  ?Result Value Ref Range  ? POC Glucose 119 (A) 70 - 99 mg/dl  ?POCT glycosylated hemoglobin (Hb A1C)  ?Result Value Ref Range  ? Hemoglobin A1C    ? HbA1c POC (<> result, manual entry)    ? HbA1c, POC (prediabetic range)    ? HbA1c, POC (controlled diabetic range) 6.9 0.0 - 7.0 %  ?Currently on Farxiga 10 mg daily and metformin 1 g twice a day ?Seen by [place] NP last mth and POC A1C at that time was 9 ?Reports she was drinking a lot of sodas and snacking on cakes and potatoe chips.  Does a lot of walking and running in place.  Tries to walk daily for 15-30 mins ?Checking BS a few times a wk at bedtime.  Gives range 160-177 ?Reports having had eye exam done 01/23/2022 by Dr. Maceo Pro.  Has BL cataract and is glaucoma suspect ?-some tingling in hands and feet at time ? ?HL: suppose to be on Lipitor 30 mg daily (20 mg + 10 mg).  She only has the 10 mg bottle with her.  Reports she has been taking 1.5 of the 10 mg tablet.  .  Last LDL was 84. Denies any muscle cramps/aches ? ?HTN:  taking Norvasc and Losartan.  Denies any chest pains or shortness of breath.  No lower extremity swelling.  No chronic headaches or dizziness. ? ?CKD 3:  Patient seen by her nephrologist Dr. Vanetta Mulders 11/22/2021. ?Patient with CKD stage IIIa.  Farxiga 10 mg added.  She is tolerating the [place]. ? ?On Effexor x 2 yrs. Reports dec hot flashes.  Also helps with stress and mood swings from being menopausal.  Reports having menses up  until her late 68s.  No menses in last 3 yrs ? ?Patient Active Problem List  ? Diagnosis Date Noted  ? Class 1 obesity due to excess calories with serious comorbidity and body mass index (BMI) of 33.0 to 33.9 in adult 06/01/2021  ? CKD (chronic kidney disease) stage 3, GFR 30-59 ml/min (HCC) 11/30/2020  ? Raynaud's phenomenon without gangrene 09/29/2020  ? Anemia in stage 3a chronic kidney disease ([place]) 09/29/2020  ? Anemia, chronic disease 02/07/2020  ? COVID-19 virus infection 09/28/2019  ? Type II diabetes mellitus with renal manifestations ([place]) 09/28/2019  ? HTN (hypertension) 09/28/2019  ? HLD (hyperlipidemia) 09/28/2019  ? Insomnia due to medical condition 09/13/2016  ? Perimenopausal 09/13/2016  ? Hyperlipidemia associated with type 2 diabetes mellitus ([place]) 07/22/2014  ? Esophageal reflux 07/22/2014  ? Diabetes ([place]) 04/22/2014  ? Essential hypertension, benign 04/22/2014  ?  ? ?Current Outpatient Medications on File Prior to Visit  ?Medication Sig Dispense Refill  ? Accu-Chek Softclix Lancets lancets Use as instructed 100 each 12  ? amLODipine (NORVASC) 10 MG tablet Take 1 tablet (10 mg total) by mouth daily. 90 tablet 3  ? ASPIRIN LOW DOSE 81 MG EC tablet TAKE 1 TABLET (81 MG TOTAL) BY MOUTH DAILY. 90 tablet 6  ? Blood Glucose Monitoring  Suppl (ACCU-CHEK GUIDE) w/Device KIT UAD 1 kit 0  ? FARXIGA 10 MG TABS tablet Take 10 mg by mouth daily.    ? glucose blood (ACCU-CHEK GUIDE) test strip Use as instructed 100 each 12  ? losartan (COZAAR) 50 MG tablet TAKE 1 TABLET BY MOUTH ONCE DAILY 90 tablet 1  ? metFORMIN (GLUCOPHAGE) 1000 MG tablet TAKE 1 TABLET BY MOUTH  TWICE DAILY WITH A MEAL 180 tablet 1  ? Multiple Vitamin (MULTIVITAMIN WITH MINERALS) TABS tablet Take 1 tablet by mouth daily. 100 tablet 0  ? Omega-3 Fatty Acids (FISH OIL) 1000 MG CAPS Take 1 capsule (1,000 mg total) by mouth daily. 100 capsule 1  ? tetrahydrozoline 0.05 % ophthalmic solution Place 2 drops into both eyes daily as needed (for dry  eyes).    ? venlafaxine XR (EFFEXOR-XR) 75 MG 24 hr capsule TAKE 1 CAPSULE BY MOUTH  DAILY WITH BREAKFAST 90 capsule 2  ? ?No current facility-administered medications on file prior to visit.  ? ? ?No Known Allergies ? ?Social History  ? ?Socioeconomic History  ? Marital status: Single  ?  Spouse name: Not on file  ? Number of children: Not on file  ? Years of education: Not on file  ? Highest education level: Not on file  ?Occupational History  ? Not on file  ?Tobacco Use  ? Smoking status: Never  ? Smokeless tobacco: Never  ?Substance and Sexual Activity  ? Alcohol use: No  ? Drug use: No  ? Sexual activity: Not on file  ?Other Topics Concern  ? Not on file  ?Social History Narrative  ? 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   ? ?Social Determinants of Health  ? ?Financial Resource Strain: Not on file  ?Food Insecurity: Not on file  ?Transportation Needs: Not on file  ?Physical Activity: Not on file  ?Stress: Not on file  ?Social Connections: Not on file  ?Intimate Partner Violence: Not on file  ? ? ?Family History  ?Problem Relation Age of Onset  ? Hypertension Mother   ? Hypertension Father   ? Heart disease Father   ? Stroke Other   ? Diabetes Other   ? Arthritis Other   ? Hypertension Sister   ? Hypertension Brother   ? Colon cancer Neg Hx   ? ? ?Past Surgical History:  ?Procedure Laterality Date  ? CHOLECYSTECTOMY    ? ? ?ROS: ?Review of Systems ?Negative except as stated above ? ?PHYSICAL EXAM: ?BP 128/81   Pulse 90   Resp 16   Wt 177 lb 12.8 oz (80.6 kg)   LMP 02/21/2016   SpO2 99%   BMI 33.60 kg/m?   ?Physical Exam ? ?General appearance - alert, well appearing, and in no distress ?Mental status - normal mood, behavior, speech, dress, motor activity, and thought processes ?Neck - supple, no significant adenopathy ?Chest - clear to auscultation, no wheezes, rales or rhonchi, symmetric air entry ?Heart - normal rate, regular rhythm, normal S1, S2, no murmurs, rubs, clicks or  gallops ?Extremities - peripheral pulses normal, no pedal edema, no clubbing or cyanosis ?Diabetic Foot Exam - Simple   ?Simple Foot Form ?Visual Inspection ?No deformities, no ulcerations, no other skin breakdown bilaterally: Yes ?Sensation Testing ?Intact to touch and monofilament testing bilaterally: Yes ?Pulse Check ?Posterior Tibialis and Dorsalis pulse intact bilaterally: Yes ?Comments ?  ? ? ? ? ?  Latest Ref Rng & Units 10/02/2021  ? 12:11 PM 09/29/2020  ?  11:19 AM 02/16/2020  ? 11:12 AM  ?CMP  ?Glucose 70 - 99 mg/dL 113   89   105    ?BUN 8 - 27 mg/dL '20   17   14    ' ?Creatinine 0.57 - 1.00 mg/dL 1.29   1.35   1.35    ?Sodium 134 - 144 mmol/L 142   142   139    ?Potassium 3.5 - 5.2 mmol/L 4.8   4.3   4.6    ?Chloride 96 - 106 mmol/L 103   104   102    ?CO2 20 - 29 mmol/L '24   23   21    ' ?Calcium 8.7 - 10.3 mg/dL 10.2   9.5   9.9    ?Total Protein 6.0 - 8.5 g/dL 7.4   7.4     ?Total Bilirubin 0.0 - 1.2 mg/dL 0.5   0.5     ?Alkaline Phos 44 - 121 IU/L 99   87     ?AST 0 - 40 IU/L 20   20     ?ALT 0 - 32 IU/L 20   23     ? ?Lipid Panel  ?   ?Component Value Date/Time  ? CHOL 168 10/02/2021 1211  ? TRIG 182 (H) 10/02/2021 1211  ? HDL 53 10/02/2021 1211  ? CHOLHDL 3.2 10/02/2021 1211  ? CHOLHDL 2.9 09/13/2016 0949  ? VLDL 23 09/13/2016 0949  ? [place] 84 10/02/2021 1211  ? ? ?CBC ?   ?Component Value Date/Time  ? WBC 7.9 10/02/2021 1211  ? WBC 12.2 (H) 10/02/2019 0335  ? RBC 4.00 10/02/2021 1211  ? RBC 3.65 (L) 10/02/2019 0335  ? HGB 11.6 10/02/2021 1211  ? HCT 34.5 10/02/2021 1211  ? PLT 289 10/02/2021 1211  ? MCV 86 10/02/2021 1211  ? MCH 29.0 10/02/2021 1211  ? MCH 28.2 10/02/2019 0335  ? MCHC 33.6 10/02/2021 1211  ? MCHC 32.8 10/02/2019 0335  ? RDW 13.7 10/02/2021 1211  ? LYMPHSABS 2.8 11/04/2019 1001  ? MONOABS 1.0 10/02/2019 0335  ? EOSABS 0.5 (H) 11/04/2019 1001  ? BASOSABS 0.1 11/04/2019 1001  ? ? ?ASSESSMENT AND PLAN: ?1. Type 2 diabetes mellitus with obesity ([place]) ?At goal.  Continue Farxiga and  metformin. ?Discussed on encourage healthy eating habits.  Advised to eliminate sugary drinks from the diet.  Discussed eating healthier snacks like fruits or nuts rather than cakes and potato chips. ?- POCT glucose (manual e

## 2022-03-15 NOTE — Patient Instructions (Signed)
We have changed the Atorvastatin your cholesterol medication to 20 mg daily.  I have sent an updated prescription to your pharmacy. ?

## 2022-03-16 LAB — BASIC METABOLIC PANEL
BUN/Creatinine Ratio: 18 (ref 12–28)
BUN: 24 mg/dL (ref 8–27)
CO2: 23 mmol/L (ref 20–29)
Calcium: 9.7 mg/dL (ref 8.7–10.3)
Chloride: 101 mmol/L (ref 96–106)
Creatinine, Ser: 1.37 mg/dL — ABNORMAL HIGH (ref 0.57–1.00)
Glucose: 103 mg/dL — ABNORMAL HIGH (ref 70–99)
Potassium: 4.3 mmol/L (ref 3.5–5.2)
Sodium: 140 mmol/L (ref 134–144)
eGFR: 44 mL/min/{1.73_m2} — ABNORMAL LOW (ref >59)

## 2022-05-09 ENCOUNTER — Other Ambulatory Visit: Payer: Self-pay | Admitting: Internal Medicine

## 2022-05-09 DIAGNOSIS — I10 Essential (primary) hypertension: Secondary | ICD-10-CM

## 2022-05-10 ENCOUNTER — Other Ambulatory Visit: Payer: Self-pay | Admitting: Internal Medicine

## 2022-05-10 DIAGNOSIS — E669 Obesity, unspecified: Secondary | ICD-10-CM

## 2022-05-11 NOTE — Telephone Encounter (Signed)
Requested Prescriptions  Pending Prescriptions Disp Refills  . metFORMIN (GLUCOPHAGE) 1000 MG tablet [Pharmacy Med Name: metFORMIN HCl 1000 MG Oral Tablet] 200 tablet 2    Sig: TAKE 1 TABLET BY MOUTH TWICE  DAILY WITH A MEAL     Endocrinology:  Diabetes - Biguanides Failed - 05/10/2022 10:20 PM      Failed - Cr in normal range and within 360 days    Creat  Date Value Ref Range Status  12/27/2015 0.94 0.50 - 1.05 mg/dL Final   Creatinine, Ser  Date Value Ref Range Status  03/15/2022 1.37 (H) 0.57 - 1.00 mg/dL Final   Creatinine, Urine  Date Value Ref Range Status  09/28/2019 87.86 mg/dL Final    Comment:    Performed at [facility], [place] [address] [place], [place] [address]         Failed - eGFR in normal range and within 360 days    GFR, Est African American  Date Value Ref Range Status  01/13/2015 >89 mL/min Final   GFR calc Af Amer  Date Value Ref Range Status  09/29/2020 50 (L) >59 mL/min/1.73 Final    Comment:    **[facility] currently reports eGFR in compliance with the current**   recommendations of the [employer]. [facility] will   update reporting as new guidelines are published from the NKF-ASN   Task force.    GFR, Est Non African American  Date Value Ref Range Status  01/13/2015 88 mL/min Final    Comment:      The estimated GFR is a calculation valid for adults (>=49 years old) that uses the CKD-EPI algorithm to adjust for age and sex. It is   not to be used for children, pregnant women, hospitalized patients,    patients on dialysis, or with rapidly changing kidney function. According to the NKDEP, eGFR >89 is normal, 60-89 shows mild impairment, 30-59 shows moderate impairment, 15-29 shows severe impairment and <15 is ESRD.      GFR calc non Af Amer  Date Value Ref Range Status  09/29/2020 43 (L) >59 mL/min/1.73 Final   eGFR  Date Value Ref Range Status  03/15/2022 44 (L) >59 mL/min/1.73 Final         Failed -  B12 Level in normal range and within 720 days    Vitamin B-12  Date Value Ref Range Status  01/13/2018 433 232 - 1,245 pg/mL Final         Failed - CBC within normal limits and completed in the last 12 months    WBC  Date Value Ref Range Status  10/02/2021 7.9 3.4 - 10.8 x10E3/uL Final  10/02/2019 12.2 (H) 4.0 - 10.5 K/uL Final   RBC  Date Value Ref Range Status  10/02/2021 4.00 3.77 - 5.28 x10E6/uL Final  10/02/2019 3.65 (L) 3.87 - 5.11 MIL/uL Final   Hemoglobin  Date Value Ref Range Status  10/02/2021 11.6 11.1 - 15.9 g/dL Final   Hematocrit  Date Value Ref Range Status  10/02/2021 34.5 34.0 - 46.6 % Final   MCHC  Date Value Ref Range Status  10/02/2021 33.6 31.5 - 35.7 g/dL Final  10/02/2019 32.8 30.0 - 36.0 g/dL Final   [facility]  Date Value Ref Range Status  10/02/2021 29.0 26.6 - 33.0 pg Final  10/02/2019 28.2 26.0 - 34.0 pg Final   MCV  Date Value Ref Range Status  10/02/2021 86 79 - 97 fL Final   No results found for: PLTCOUNTKUC,  LABPLAT, POCPLA RDW  Date Value Ref Range Status  10/02/2021 13.7 11.7 - 15.4 % Final         Passed - HBA1C is between 0 and 7.9 and within 180 days    HbA1c, POC (prediabetic range)  Date Value Ref Range Status  09/29/2020 6.4 5.7 - 6.4 % Final   HbA1c, POC (controlled diabetic range)  Date Value Ref Range Status  03/15/2022 6.9 0.0 - 7.0 % Final         Passed - Valid encounter within last 6 months    Recent Outpatient Visits          1 month ago Type 2 diabetes mellitus with obesity ([place])   [place] Karle Plumber B, MD   5 months ago Pap smear for cervical cancer screening   [place] Karle Plumber B, MD   7 months ago Type 2 diabetes mellitus with obesity [facility])   [place] Ladell Pier, MD   10 months ago Need for shingles vaccine   [place], Stephen L, RPH-CPP   11  months ago Type 2 diabetes mellitus with hypoglycemia without coma, without long-term current use of insulin [facility])   [place], Deborah B, MD      Future Appointments            In 2 months Wynetta Emery, [name], MD [place]

## 2022-05-22 DIAGNOSIS — I129 Hypertensive chronic kidney disease with stage 1 through stage 4 chronic kidney disease, or unspecified chronic kidney disease: Secondary | ICD-10-CM | POA: Diagnosis not present

## 2022-05-22 DIAGNOSIS — E1122 Type 2 diabetes mellitus with diabetic chronic kidney disease: Secondary | ICD-10-CM | POA: Diagnosis not present

## 2022-05-22 DIAGNOSIS — N281 Cyst of kidney, acquired: Secondary | ICD-10-CM | POA: Diagnosis not present

## 2022-05-22 DIAGNOSIS — N1831 Chronic kidney disease, stage 3a: Secondary | ICD-10-CM | POA: Diagnosis not present

## 2022-05-30 ENCOUNTER — Encounter: Payer: Self-pay | Admitting: Internal Medicine

## 2022-05-30 NOTE — Progress Notes (Signed)
Note received from nephrology Dr. Kathrene Bongo.  Patient seen 05/22/2022. Urinalysis showed protein creatinine ratio 438 Diagnosis CKD 3: Suspect that her mild CKD is due to a previous episode of AKI along with some baseline diabetes and hypertension issues.  Patient informed of the importance of diabetes and blood pressure control.  Continue Farxiga 10 mg daily. Bilateral renal cysts: Bilateral kidney cysts that were described as mildly complex.  No further radiographic screening suggested.  Possibly look to repeat ultrasound in 1 to 2 years.

## 2022-07-16 ENCOUNTER — Encounter: Payer: Self-pay | Admitting: Internal Medicine

## 2022-07-16 ENCOUNTER — Ambulatory Visit: Payer: Medicare Other | Attending: Internal Medicine | Admitting: Internal Medicine

## 2022-07-16 VITALS — BP 115/68 | HR 91 | Temp 98.1°F | Ht 61.0 in | Wt 176.6 lb

## 2022-07-16 DIAGNOSIS — N1831 Chronic kidney disease, stage 3a: Secondary | ICD-10-CM | POA: Diagnosis not present

## 2022-07-16 DIAGNOSIS — L219 Seborrheic dermatitis, unspecified: Secondary | ICD-10-CM

## 2022-07-16 DIAGNOSIS — I152 Hypertension secondary to endocrine disorders: Secondary | ICD-10-CM | POA: Diagnosis not present

## 2022-07-16 DIAGNOSIS — E785 Hyperlipidemia, unspecified: Secondary | ICD-10-CM | POA: Diagnosis not present

## 2022-07-16 DIAGNOSIS — E669 Obesity, unspecified: Secondary | ICD-10-CM | POA: Diagnosis not present

## 2022-07-16 DIAGNOSIS — E119 Type 2 diabetes mellitus without complications: Secondary | ICD-10-CM

## 2022-07-16 DIAGNOSIS — E1169 Type 2 diabetes mellitus with other specified complication: Secondary | ICD-10-CM | POA: Diagnosis not present

## 2022-07-16 DIAGNOSIS — E1159 Type 2 diabetes mellitus with other circulatory complications: Secondary | ICD-10-CM | POA: Diagnosis not present

## 2022-07-16 LAB — GLUCOSE, POCT (MANUAL RESULT ENTRY): POC Glucose: 111 mg/dl — AB (ref 70–99)

## 2022-07-16 LAB — POCT GLYCOSYLATED HEMOGLOBIN (HGB A1C): HbA1c, POC (controlled diabetic range): 7.1 % — AB (ref 0.0–7.0)

## 2022-07-16 NOTE — Progress Notes (Signed)
Patient ID: [name] Barker, female    DOB: [birth year]  MRN: [id number]  CC: Hypertension   Subjective: [name] Barker is a 61 y.o. female who presents for chronic ds management Her concerns today include:  Patient with history of DM with microalbumin, HL, HTN, perimenopausal (On Effexor), anemia chronic ds, insomnia, CKD stage 3, BL complicated renal cyst, COVID pneumonia  DM: Results for orders placed or performed in visit on 07/16/22  POCT glucose (manual entry)  Result Value Ref Range   POC Glucose 111 (A) 70 - 99 mg/dl  POCT glycosylated hemoglobin (Hb A1C)  Result Value Ref Range   Hemoglobin A1C     HbA1c POC (<> result, manual entry)     HbA1c, POC (prediabetic range)     HbA1c, POC (controlled diabetic range) 7.1 (A) 0.0 - 7.0 %  -checking BS once a day in the evenings at bedtime.  Range 155-175. Doing better with eating habits. Eats oatmeal or cereal for BF, salad or veggies for lunch and full meal for dinner Does a lot of walking as she does not have a vehicle Taking and tolerating farxiga 10 mg daily and Metformin 1 gram BID.  Urinates a lot since being on Farxiga Reports compliance with taking the atorvastatin.  She is tolerating the medication without significant cramps.  HTN:  checks BP occasionally.  Reports good readings.  Limits salt in foods. Compliant with Norvasc 10 mg and Cozaar 50 mg daily No CP, SOB, HA  CKD 3: Last saw her nephrologist Dr. Moshe Cipro 05/22/2022.  Patient advised to continue [place].  She has history of bilateral renal cysts that were described as mildly complex.  No further radiographic screening suggested.  Dr. Moshe Cipro states that we can possibly repeat the ultrasound in 1 to 2 years.  Noted to have hypopigmented flaking of the skin of the eyebrows and around the ears.  Patient states that she gets dandruff on the scalp and sometimes it moves to the eyebrows.  She uses selenium blue shampoo and washes her hair once every 2  weeks.  Patient Active Problem List   Diagnosis Date Noted   Class 1 obesity due to excess calories with serious comorbidity and body mass index (BMI) of 33.0 to 33.9 in adult 06/01/2021   CKD (chronic kidney disease) stage 3, GFR 30-59 ml/min ([place]) 11/30/2020   Raynaud's phenomenon without gangrene 09/29/2020   Anemia in stage 3a chronic kidney disease ([place]) 09/29/2020   Anemia, chronic disease 02/07/2020   COVID-19 virus infection 09/28/2019   Type II diabetes mellitus with renal manifestations ([place]) 09/28/2019   HTN (hypertension) 09/28/2019   HLD (hyperlipidemia) 09/28/2019   Insomnia due to medical condition 09/13/2016   Perimenopausal 09/13/2016   Hyperlipidemia associated with type 2 diabetes mellitus ([place]) 07/22/2014   Esophageal reflux 07/22/2014   Diabetes ([place]) 04/22/2014   Essential hypertension, benign 04/22/2014     Current Outpatient Medications on File Prior to Visit  Medication Sig Dispense Refill   Accu-Chek Softclix Lancets lancets Use as instructed 100 each 12   amLODipine (NORVASC) 10 MG tablet TAKE 1 TABLET BY MOUTH  DAILY 100 tablet 2   ASPIRIN LOW DOSE 81 MG EC tablet TAKE 1 TABLET (81 MG TOTAL) BY MOUTH DAILY. 90 tablet 6   atorvastatin (LIPITOR) 20 MG tablet Take 1 tablet (20 mg total) by mouth daily. 90 tablet 3   Blood Glucose Monitoring Suppl (ACCU-CHEK GUIDE) w/Device KIT UAD 1 kit 0   FARXIGA 10 MG TABS tablet Take  10 mg by mouth daily.     glucose blood (ACCU-CHEK GUIDE) test strip Use as instructed 100 each 12   losartan (COZAAR) 50 MG tablet TAKE 1 TABLET BY MOUTH ONCE  DAILY 100 tablet 2   metFORMIN (GLUCOPHAGE) 1000 MG tablet TAKE 1 TABLET BY MOUTH TWICE  DAILY WITH A MEAL 200 tablet 2   Multiple Vitamin (MULTIVITAMIN WITH MINERALS) TABS tablet Take 1 tablet by mouth daily. 100 tablet 0   Omega-3 Fatty Acids (FISH OIL) 1000 MG CAPS Take 1 capsule (1,000 mg total) by mouth daily. 100 capsule 1   tetrahydrozoline 0.05 % ophthalmic solution Place 2  drops into both eyes daily as needed (for dry eyes).     venlafaxine XR (EFFEXOR-XR) 75 MG 24 hr capsule TAKE 1 CAPSULE BY MOUTH  DAILY WITH BREAKFAST 90 capsule 2   No current facility-administered medications on file prior to visit.    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    Social Determinants of Health   Financial Resource Strain: Not on file  Food Insecurity: Not on file  Transportation Needs: Not on file  Physical Activity: Not on file  Stress: Not on file  Social Connections: Not on file  Intimate Partner Violence: Not on file    Family History  Problem Relation Age of Onset   Hypertension Mother    Hypertension Father    Heart disease Father    Stroke Other    Diabetes Other    Arthritis Other    Hypertension Sister    Hypertension Brother    Colon cancer Neg Hx     Past Surgical History:  Procedure Laterality Date   CHOLECYSTECTOMY      ROS: Review of Systems Negative except as stated above  PHYSICAL EXAM: BP 115/68   Pulse 91   Temp 98.1 F (36.7 C) (Oral)   Ht '5\' 1"'  (1.549 m)   Wt 176 lb 9.6 oz (80.1 kg)   LMP 12/25/2015   SpO2 99%   BMI 33.37 kg/m   Physical Exam  General appearance - alert, well appearing, and in no distress Mental status - normal mood, behavior, speech, dress, motor activity, and thought processes Neck - supple, no significant adenopathy Chest - clear to auscultation, no wheezes, rales or rhonchi, symmetric air  entry Heart - normal rate, regular rhythm, normal S1, S2, no murmurs, rubs, clicks or gallops Extremities - peripheral pulses normal, no pedal edema, no clubbing or cyanosis Skin -hypopigmented scaly rash in the eyebrows and anterior to both ears.      Latest Ref Rng & Units 03/15/2022   11:49 AM 10/02/2021   12:11 PM 09/29/2020   11:19 AM  CMP  Glucose 70 - 99 mg/dL 103  113  89   BUN 8 - 27 mg/dL '24  20  17   ' Creatinine 0.57 - 1.00  mg/dL 1.37  1.29  1.35   Sodium 134 - 144 mmol/L 140  142  142   Potassium 3.5 - 5.2 mmol/L 4.3  4.8  4.3   Chloride 96 - 106 mmol/L 101  103  104   CO2 20 - 29 mmol/L '23  24  23   ' Calcium 8.7 - 10.3 mg/dL 9.7  10.2  9.5   Total Protein 6.0 - 8.5 g/dL  7.4  7.4   Total Bilirubin 0.0 - 1.2 mg/dL  0.5  0.5   Alkaline Phos 44 - 121 IU/L  99  87   AST 0 - 40 IU/L  20  20   ALT 0 - 32 IU/L  20  23    Lipid Panel     Component Value Date/Time   CHOL 168 10/02/2021 1211   TRIG 182 (H) 10/02/2021 1211   HDL 53 10/02/2021 1211   CHOLHDL 3.2 10/02/2021 1211   CHOLHDL 2.9 09/13/2016 0949   VLDL 23 09/13/2016 0949   LDLCALC 84 10/02/2021 1211    CBC    Component Value Date/Time   WBC 7.9 10/02/2021 1211   WBC 12.2 (H) 10/02/2019 0335   RBC 4.00 10/02/2021 1211   RBC 3.65 (L) 10/02/2019 0335   HGB 11.6 10/02/2021 1211   HCT 34.5 10/02/2021 1211   PLT 289 10/02/2021 1211   MCV 86 10/02/2021 1211   MCH 29.0 10/02/2021 1211   MCH 28.2 10/02/2019 0335   MCHC 33.6 10/02/2021 1211   MCHC 32.8 10/02/2019 0335   RDW 13.7 10/02/2021 1211   LYMPHSABS 2.8 11/04/2019 1001   MONOABS 1.0 10/02/2019 0335   EOSABS 0.5 (H) 11/04/2019 1001   BASOSABS 0.1 11/04/2019 1001    ASSESSMENT AND PLAN:  1. Type 2 diabetes mellitus with obesity (HCC) A1c slightly above goal.  We agreed not to make any changes in her medications at this time.  Continue metformin and Farxiga. Encouraged her to continue healthy eating habits and regular exercise. - POCT glucose  (manual entry)  2. Hyperlipidemia associated with type 2 diabetes mellitus (HCC) Continue atorvastatin 20 mg daily.  She will be due for lipid profile check on next visit.  3. Hypertension associated with diabetes ([place]) At goal.  Continue Norvasc 10 mg and Cozaar 50 mg daily.  4. Seborrheic dermatitis Recommend using [place] Blue shampoo in the eyebrows and on the skin anterior to the ear daily.  She should lather the skin and leave it on for about 10 minutes each time and then rinse off.  Use the shampoo on the scalp at least once a week.  5. Stage 3a chronic kidney disease (HCC) Stable.  We will continue to monitor.  Okay to continue metformin for now.    Patient was given the opportunity to ask questions.  Patient verbalized understanding of the plan and was able to repeat key elements of the plan.   This documentation was completed using Radio producer.  Any transcriptional errors are unintentional.  Orders Placed This Encounter  Procedures   POCT glucose (manual entry)   POCT glycosylated hemoglobin (Hb A1C)     Requested Prescriptions    No prescriptions requested or ordered in this encounter    Return in about 4 months (around 11/16/2022) for Give appt with Lurena Joiner in 3 wks for [employer].  Karle Plumber, MD, FACP

## 2022-07-23 DIAGNOSIS — H40003 Preglaucoma, unspecified, bilateral: Secondary | ICD-10-CM | POA: Diagnosis not present

## 2022-09-06 ENCOUNTER — Other Ambulatory Visit: Payer: Self-pay | Admitting: Internal Medicine

## 2022-09-06 DIAGNOSIS — N951 Menopausal and female climacteric states: Secondary | ICD-10-CM

## 2022-09-22 ENCOUNTER — Other Ambulatory Visit: Payer: Self-pay | Admitting: Internal Medicine

## 2022-11-05 DIAGNOSIS — E1122 Type 2 diabetes mellitus with diabetic chronic kidney disease: Secondary | ICD-10-CM | POA: Diagnosis not present

## 2022-11-05 DIAGNOSIS — N1831 Chronic kidney disease, stage 3a: Secondary | ICD-10-CM | POA: Diagnosis not present

## 2022-11-05 DIAGNOSIS — N281 Cyst of kidney, acquired: Secondary | ICD-10-CM | POA: Diagnosis not present

## 2022-11-05 DIAGNOSIS — I129 Hypertensive chronic kidney disease with stage 1 through stage 4 chronic kidney disease, or unspecified chronic kidney disease: Secondary | ICD-10-CM | POA: Diagnosis not present

## 2022-11-11 ENCOUNTER — Other Ambulatory Visit: Payer: Self-pay | Admitting: Internal Medicine

## 2022-11-22 ENCOUNTER — Other Ambulatory Visit: Payer: Self-pay | Admitting: Internal Medicine

## 2022-11-22 ENCOUNTER — Ambulatory Visit: Payer: Medicare Other | Attending: Internal Medicine | Admitting: Internal Medicine

## 2022-11-22 ENCOUNTER — Encounter: Payer: Self-pay | Admitting: Internal Medicine

## 2022-11-22 ENCOUNTER — Other Ambulatory Visit: Payer: Self-pay

## 2022-11-22 VITALS — BP 130/82 | HR 83 | Temp 98.0°F | Ht 61.0 in | Wt 175.0 lb

## 2022-11-22 DIAGNOSIS — E669 Obesity, unspecified: Secondary | ICD-10-CM

## 2022-11-22 DIAGNOSIS — Z2911 Encounter for prophylactic immunotherapy for respiratory syncytial virus (RSV): Secondary | ICD-10-CM

## 2022-11-22 DIAGNOSIS — E1159 Type 2 diabetes mellitus with other circulatory complications: Secondary | ICD-10-CM

## 2022-11-22 DIAGNOSIS — E1129 Type 2 diabetes mellitus with other diabetic kidney complication: Secondary | ICD-10-CM | POA: Diagnosis not present

## 2022-11-22 DIAGNOSIS — I152 Hypertension secondary to endocrine disorders: Secondary | ICD-10-CM | POA: Diagnosis not present

## 2022-11-22 DIAGNOSIS — E1169 Type 2 diabetes mellitus with other specified complication: Secondary | ICD-10-CM | POA: Diagnosis not present

## 2022-11-22 DIAGNOSIS — R809 Proteinuria, unspecified: Secondary | ICD-10-CM | POA: Diagnosis not present

## 2022-11-22 DIAGNOSIS — Z23 Encounter for immunization: Secondary | ICD-10-CM | POA: Diagnosis not present

## 2022-11-22 DIAGNOSIS — E785 Hyperlipidemia, unspecified: Secondary | ICD-10-CM

## 2022-11-22 LAB — GLUCOSE, POCT (MANUAL RESULT ENTRY): POC Glucose: 107 mg/dl — AB (ref 70–99)

## 2022-11-22 LAB — POCT GLYCOSYLATED HEMOGLOBIN (HGB A1C): HbA1c, POC (controlled diabetic range): 6.6 % (ref 0.0–7.0)

## 2022-11-22 MED ORDER — ATORVASTATIN CALCIUM 10 MG PO TABS: ORAL_TABLET | ORAL | 3 refills | Status: DC

## 2022-11-22 MED ORDER — RSVPREF3 VAC RECOMB ADJUVANTED 120 MCG/0.5ML IM SUSR
0.5000 mL | Freq: Once | INTRAMUSCULAR | 0 refills | Status: AC
  Filled 2022-11-22: qty 0.5, 1d supply, fill #0

## 2022-11-22 MED ORDER — AREXVY 120 MCG/0.5ML IM SUSR
INTRAMUSCULAR | 0 refills | Status: DC
  Filled 2022-11-22: qty 0.5, 1d supply, fill #0

## 2022-11-22 MED ORDER — ATORVASTATIN CALCIUM 20 MG PO TABS: 20.0000 mg | ORAL_TABLET | Freq: Every day | ORAL | 3 refills | Status: DC

## 2022-11-22 NOTE — Progress Notes (Signed)
Patient ID: [name] Barker, female    DOB: December 13, 1961  MRN: [id number]  CC: chronic ds management Diabetes (DM f/u. Med refill./Yes to flu vax.)   Subjective: [name] Barker is a 61 y.o. female who presents for chronic ds management Her concerns today include:  Patient with history of DM with microalbumin, HL, HTN, perimenopausal (On Effexor), anemia chronic ds, insomnia, CKD stage 3, BL complicated renal cyst, COVID pneumonia  DM: Results for orders placed or performed in visit on 11/22/22  POCT glucose (manual entry)  Result Value Ref Range   POC Glucose 107 (A) 70 - 99 mg/dl  POCT glycosylated hemoglobin (Hb A1C)  Result Value Ref Range   Hemoglobin A1C     HbA1c POC (<> result, manual entry)     HbA1c, POC (prediabetic range)     HbA1c, POC (controlled diabetic range) 6.6 0.0 - 7.0 %  A1C has decreased since last visit Doing better with eating habits.  Does a lot of walking as she does not have a vehicle Taking and tolerating farxiga 10 mg daily and Metformin 1 gram BID.  Urinates a lot since being on Farxiga Reports compliance with taking the atorvastatin 20 mg only.  According to med list, should be 73m +10 mg daily for total of 30 mg/daily.  She is tolerating the medication without significant cramps.  HTN: Reports compliance with Norvasc 10 mg and Cozaar 50 mg daily.  Did not take meds as yet for today.  Does not check BP but does have device.  BP was 120/70 when she saw nephrology 11/05/2022 No chest pains, shortness of breath or headaches  CKD 3: Followed by Dr. GMoshe Ciprowith [facility].  Recently seen 11/05/2022.  Mild proteinuria of 303.  In regards to the bilateral kidney cysts that were described as mildly complex, no further radiograph screening was suggested. She suggested we will look to repeat ultrasound in a year or 2.  HM: due for flu and RSV   Patient Active Problem List   Diagnosis Date Noted   Class 1 obesity due to excess calories with  serious comorbidity and body mass index (BMI) of 33.0 to 33.9 in adult 06/01/2021   CKD (chronic kidney disease) stage 3, GFR 30-59 ml/min ([place] 11/30/2020   Raynaud's phenomenon without gangrene 09/29/2020   Anemia in stage 3a chronic kidney disease ([place] 09/29/2020   Anemia, chronic disease 02/07/2020   COVID-19 virus infection 09/28/2019   Type II diabetes mellitus with renal manifestations ([place] 09/28/2019   HTN (hypertension) 09/28/2019   HLD (hyperlipidemia) 09/28/2019   Insomnia due to medical condition 09/13/2016   Perimenopausal 09/13/2016   Hyperlipidemia associated with type 2 diabetes mellitus ([place] 07/22/2014   Esophageal reflux 07/22/2014   Diabetes ([facility] 04/22/2014   Essential hypertension, benign 04/22/2014     Current Outpatient Medications on File Prior to Visit  Medication Sig Dispense Refill   Accu-Chek Softclix Lancets lancets Use as instructed 100 each 12   amLODipine (NORVASC) 10 MG tablet TAKE 1 TABLET BY MOUTH  DAILY 100 tablet 2   ASPIRIN LOW DOSE 81 MG EC tablet TAKE 1 TABLET (81 MG TOTAL) BY MOUTH DAILY. 90 tablet 6   Blood Glucose Monitoring Suppl (ACCU-CHEK GUIDE) w/Device KIT UAD 1 kit 0   FARXIGA 10 MG TABS tablet Take 10 mg by mouth daily.     glucose blood (ACCU-CHEK GUIDE) test strip Use as instructed 100 each 12   losartan (COZAAR) 50 MG tablet TAKE 1 TABLET BY MOUTH  ONCE  DAILY 100 tablet 2   metFORMIN (GLUCOPHAGE) 1000 MG tablet TAKE 1 TABLET BY MOUTH TWICE  DAILY WITH A MEAL 200 tablet 2   Multiple Vitamin (MULTIVITAMIN WITH MINERALS) TABS tablet Take 1 tablet by mouth daily. 100 tablet 0   Omega-3 Fatty Acids (FISH OIL) 1000 MG CAPS Take 1 capsule (1,000 mg total) by mouth daily. 100 capsule 1   tetrahydrozoline 0.05 % ophthalmic solution Place 2 drops into both eyes daily as needed (for dry eyes).     venlafaxine XR (EFFEXOR-XR) 75 MG 24 hr capsule TAKE 1 CAPSULE BY MOUTH DAILY  WITH BREAKFAST 100 capsule 0   No current facility-administered  medications on file prior to visit.    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    Social Determinants of Health   Financial Resource Strain: Not on file  Food Insecurity: Not on file  Transportation Needs: Not on file  Physical Activity: Not on file  Stress: Not on file  Social Connections: Not on file  Intimate Partner Violence: Not on file    Family History  Problem Relation Age of Onset   Hypertension Mother    Hypertension Father    Heart disease Father    Stroke Other    Diabetes Other    Arthritis Other    Hypertension Sister    Hypertension Brother    Colon cancer Neg Hx     Past Surgical History:  Procedure Laterality Date   CHOLECYSTECTOMY      ROS: Review of Systems Negative except as stated above  PHYSICAL EXAM: BP 130/82   Pulse 83   Temp 98 F (36.7 C) (Oral)   Ht _0  (1.549 m)   Wt 175 lb (79.4 kg)   LMP 12/25/2015   SpO2 98%   BMI 33.07 kg/m   Wt Readings from Last 3 Encounters:  11/22/22 175 lb (79.4 kg)  07/16/22 176 lb 9.6 oz (80.1 kg)  03/15/22 177 lb 12.8 oz (80.6 kg)    Physical Exam  General appearance - alert, well appearing, older African-American female and in no distress Mental status - alert, oriented to person, place, and time Chest - clear to auscultation, no wheezes, rales or rhonchi, symmetric air entry Heart - normal rate, regular rhythm, normal S1, S2, no murmurs, rubs, clicks or gallops Extremities - peripheral  pulses normal, no pedal edema, no clubbing or cyanosis      Latest Ref Rng & Units 03/15/2022   11:49 AM 10/02/2021   12:11 PM 09/29/2020   11:19 AM  CMP  Glucose 70 - 99 mg/dL 103  113  89   BUN 8 - 27 mg/dL _1 Creatinine 0.57 - 1.00 mg/dL 1.37  1.29  1.35   Sodium 134 - 144 mmol/L 140  142  142   Potassium 3.5 -  5.2 mmol/L 4.3  4.8  4.3   Chloride 96 - 106 mmol/L 101  103  104   CO2 20 - 29 mmol/L _0 Calcium 8.7 - 10.3 mg/dL 9.7  10.2  9.5   Total Protein 6.0 - 8.5 g/dL  7.4  7.4   Total Bilirubin 0.0 - 1.2 mg/dL  0.5  0.5   Alkaline Phos 44 - 121 IU/L  99  87   AST 0 - 40 IU/L  20  20   ALT 0 - 32 IU/L  20  23    Lipid Panel     Component Value Date/Time   CHOL 168 10/02/2021 1211   TRIG 182 (H) 10/02/2021 1211   HDL 53 10/02/2021 1211   CHOLHDL 3.2 10/02/2021 1211   CHOLHDL 2.9 09/13/2016 0949   VLDL 23 09/13/2016 0949   LDLCALC 84 10/02/2021 1211    CBC    Component Value Date/Time   WBC 7.9 10/02/2021 1211   WBC 12.2 (H) 10/02/2019 0335   RBC 4.00 10/02/2021 1211   RBC 3.65 (L) 10/02/2019 0335   HGB 11.6 10/02/2021 1211   HCT 34.5 10/02/2021 1211   PLT 289 10/02/2021 1211   MCV 86 10/02/2021 1211   MCH 29.0 10/02/2021 1211   MCH 28.2 10/02/2019 0335   MCHC 33.6 10/02/2021 1211   MCHC 32.8 10/02/2019 0335   RDW 13.7 10/02/2021 1211   LYMPHSABS 2.8 11/04/2019 1001   MONOABS 1.0 10/02/2019 0335   EOSABS 0.5 (H) 11/04/2019 1001   BASOSABS 0.1 11/04/2019 1001    ASSESSMENT AND PLAN: 1. Type 2 diabetes mellitus with obesity ([place]) At goal.  Continue Farxiga.  Encouraged her to continue healthy eating habits and to be mindful of the amount that she eats over the upcoming holidays.  Continue regular exercise. - POCT glucose (manual entry) - POCT glycosylated hemoglobin (Hb A1C) - CBC - Comprehensive metabolic panel  2. Hypertension associated with diabetes ([place]) Close to goal.  She has not taken her medicines as yet for the morning but  will do so when she returns home.  3. Hyperlipidemia associated with type 2 diabetes mellitus ([place]) Advised patient that she should be on 30 mg of the atorvastatin daily.  Updated prescription sent to her pharmacy. - Lipid panel - atorvastatin (LIPITOR) 10 MG tablet; TAKE 1 TABLET BY MOUTH DAILY  TAKE ALONG WITH ONE 20 MG TABLET FOR A TOTAL DOSE OF 30 MG DAILY  Dispense: 90 tablet; Refill: 3 - atorvastatin (LIPITOR) 20 MG tablet; Take 1 tablet (20 mg total) by mouth daily.  Dispense: 90 tablet; Refill: 3  4. 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  She is on Cozaar.  5. Need for influenza vaccination Given today.  6. Need for RSV immunization - RSV vaccine recomb adjuvanted (AREXVY) 120 MCG/0.5ML injection; Inject 0.5 mLs into the muscle once for 1 dose.  Dispense: 0.5 mL; Refill: 0      Patient was given the opportunity to ask questions.  Patient verbalized understanding of the plan and was able to repeat key elements of the plan.   This documentation was completed using Radio producer.  Any transcriptional errors are unintentional.  Orders Placed This Encounter  Procedures   Flu Vaccine MDCK QUAD PF   CBC   Comprehensive metabolic panel   Lipid panel   POCT glucose (manual entry)   POCT glycosylated hemoglobin (Hb A1C)  Requested Prescriptions   Signed Prescriptions Disp Refills   RSV vaccine recomb adjuvanted (AREXVY) 120 MCG/0.5ML injection 0.5 mL 0    Sig: Inject 0.5 mLs into the muscle once for 1 dose.   atorvastatin (LIPITOR) 10 MG tablet 90 tablet 3    Sig: TAKE 1 TABLET BY MOUTH DAILY  TAKE ALONG WITH ONE 20 MG TABLET FOR A TOTAL DOSE OF 30 MG DAILY   atorvastatin (LIPITOR) 20 MG tablet 90 tablet 3    Sig: Take 1 tablet (20 mg total) by mouth daily.    Return in about 6 weeks (around 01/03/2023) for for Medicare Wellness Visit.  Karle Plumber, MD, FACP

## 2022-11-23 LAB — CBC
Hematocrit: 34.9 % (ref 34.0–46.6)
Hemoglobin: 11.5 g/dL (ref 11.1–15.9)
MCH: 28.5 pg (ref 26.6–33.0)
MCHC: 33 g/dL (ref 31.5–35.7)
MCV: 87 fL (ref 79–97)
Platelets: 292 10*3/uL (ref 150–450)
RBC: 4.03 x10E6/uL (ref 3.77–5.28)
RDW: 13.8 % (ref 11.7–15.4)
WBC: 6.6 10*3/uL (ref 3.4–10.8)

## 2022-11-23 LAB — COMPREHENSIVE METABOLIC PANEL
ALT: 20 IU/L (ref 0–32)
AST: 20 IU/L (ref 0–40)
Albumin/Globulin Ratio: 2 (ref 1.2–2.2)
Albumin: 4.7 g/dL (ref 3.9–4.9)
Alkaline Phosphatase: 81 IU/L (ref 44–121)
BUN/Creatinine Ratio: 14 (ref 12–28)
BUN: 20 mg/dL (ref 8–27)
Bilirubin Total: 0.7 mg/dL (ref 0.0–1.2)
CO2: 23 mmol/L (ref 20–29)
Calcium: 9.4 mg/dL (ref 8.7–10.3)
Chloride: 102 mmol/L (ref 96–106)
Creatinine, Ser: 1.39 mg/dL — ABNORMAL HIGH (ref 0.57–1.00)
Globulin, Total: 2.4 g/dL (ref 1.5–4.5)
Glucose: 100 mg/dL — ABNORMAL HIGH (ref 70–99)
Potassium: 4.4 mmol/L (ref 3.5–5.2)
Sodium: 141 mmol/L (ref 134–144)
Total Protein: 7.1 g/dL (ref 6.0–8.5)
eGFR: 43 mL/min/{1.73_m2} — ABNORMAL LOW (ref >59)

## 2022-11-23 LAB — LIPID PANEL
Chol/HDL Ratio: 3.2 ratio (ref 0.0–4.4)
Cholesterol, Total: 174 mg/dL (ref 100–199)
HDL: 54 mg/dL (ref >39)
LDL Chol Calc (NIH): 98 mg/dL (ref 0–99)
Triglycerides: 124 mg/dL (ref 0–149)
VLDL Cholesterol Cal: 22 mg/dL (ref 5–40)

## 2022-11-23 NOTE — Progress Notes (Signed)
Let patient know that her kidney function is not at 100% but stable.  Liver function test normal.  Blood cell counts normal.  Cholesterol level was 98 with goal being less than 70.  We increased the atorvastatin yesterday to 30 mg daily.

## 2022-12-10 ENCOUNTER — Other Ambulatory Visit: Payer: Self-pay | Admitting: Internal Medicine

## 2022-12-10 DIAGNOSIS — N951 Menopausal and female climacteric states: Secondary | ICD-10-CM

## 2023-01-22 DIAGNOSIS — E1136 Type 2 diabetes mellitus with diabetic cataract: Secondary | ICD-10-CM | POA: Diagnosis not present

## 2023-01-22 DIAGNOSIS — H40003 Preglaucoma, unspecified, bilateral: Secondary | ICD-10-CM | POA: Diagnosis not present

## 2023-01-22 DIAGNOSIS — H25813 Combined forms of age-related cataract, bilateral: Secondary | ICD-10-CM | POA: Diagnosis not present

## 2023-01-22 DIAGNOSIS — Z7984 Long term (current) use of oral hypoglycemic drugs: Secondary | ICD-10-CM | POA: Diagnosis not present

## 2023-01-22 LAB — HM DIABETES EYE EXAM

## 2023-02-08 ENCOUNTER — Ambulatory Visit: Payer: 59 | Attending: Internal Medicine

## 2023-02-08 DIAGNOSIS — Z0000 Encounter for general adult medical examination without abnormal findings: Secondary | ICD-10-CM | POA: Diagnosis not present

## 2023-02-08 NOTE — Progress Notes (Signed)
Subjective:   [name] Barker is a 62 y.o. female who presents for Medicare Annual (Subsequent) preventive examination.  Review of Systems    connected with  [name] Barker on 02/08/23 at  950 am by telephone and verified that I am speaking with the correct person using two identifiers. I discussed the limitations, risks, security and privacy concerns of performing an evaluation and management service by telephone and the availability of in person appointments. I also discussed with the patient that there may be a patient responsible charge related to this service. The patient expressed understanding and agreed to proceed.  Patient location:  at home  My Location: Community [facility] and wellness  Persons on the telephone call:   Myself (Damyah Gugel [facility] ) and [name] Barker        Objective:    There were no vitals filed for this visit. There is no height or weight on file to calculate BMI.     02/08/2023    9:57 AM 06/28/2021    3:19 PM 06/23/2020    8:46 AM 09/28/2019    3:00 AM 09/27/2019    4:59 PM 10/17/2017   10:42 AM 09/03/2017    2:21 PM  Advanced Directives  Does Patient Have a Medical Advance Directive? Yes No No No No No No  Would patient like information on creating a medical advance directive?  No - Patient declined Yes (Inpatient - patient defers creating a medical advance directive at this time - Information given) No - Patient declined Yes (ED - Information included in AVS)  No - Patient declined    Current Medications (verified) Outpatient Encounter Medications as of 02/08/2023  Medication Sig   Accu-Chek Softclix Lancets lancets Use as instructed   amLODipine (NORVASC) 10 MG tablet TAKE 1 TABLET BY MOUTH  DAILY   ASPIRIN LOW DOSE 81 MG EC tablet TAKE 1 TABLET (81 MG TOTAL) BY MOUTH DAILY.   atorvastatin (LIPITOR) 10 MG tablet TAKE 1 TABLET BY MOUTH DAILY  TAKE ALONG WITH ONE 20 MG TABLET FOR A TOTAL DOSE OF 30 MG DAILY   atorvastatin (LIPITOR) 20 MG tablet Take 1 tablet (20 mg total)  by mouth daily.   Blood Glucose Monitoring Suppl (ACCU-CHEK GUIDE) w/Device KIT UAD   FARXIGA 10 MG TABS tablet Take 10 mg by mouth daily.   glucose blood (ACCU-CHEK GUIDE) test strip Use as instructed   losartan (COZAAR) 50 MG tablet TAKE 1 TABLET BY MOUTH ONCE  DAILY   metFORMIN (GLUCOPHAGE) 1000 MG tablet TAKE 1 TABLET BY MOUTH TWICE  DAILY WITH A MEAL   Multiple Vitamin (MULTIVITAMIN WITH MINERALS) TABS tablet Take 1 tablet by mouth daily.   Omega-3 Fatty Acids (FISH OIL) 1000 MG CAPS Take 1 capsule (1,000 mg total) by mouth daily.   tetrahydrozoline 0.05 % ophthalmic solution Place 2 drops into both eyes daily as needed (for dry eyes).   venlafaxine XR (EFFEXOR-XR) 75 MG 24 hr capsule TAKE 1 CAPSULE BY MOUTH DAILY  WITH BREAKFAST (Patient not taking: Reported on 02/08/2023)   No facility-administered encounter medications on file as of 02/08/2023.    Allergies (verified) Patient has no known allergies.   History: Past Medical History:  Diagnosis Date   Cataract 01/2018   BL - Dr. Gershon Crane   Diabetes mellitus Dx 1998   High cholesterol    Hip pain    Other and unspecified hyperlipidemia    Personal history of unspecified circulatory disease    S/P cardiac cath June 2008   abnormal  cardiolite. LVH..question on prior studies and question septal hypertroph...not seen echo... EF 65%   Past Surgical History:  Procedure Laterality Date   CHOLECYSTECTOMY     Family History  Problem Relation Age of Onset   Hypertension Mother    Hypertension Father    Heart disease Father    Stroke Other    Diabetes Other    Arthritis Other    Hypertension Sister    Hypertension Brother    Colon cancer Neg Hx    Social History   Socioeconomic History   Marital status: Single    Spouse name: Not on file   Number of children: Not on file   Years of education: Not on file   Highest education level: Not on file  Occupational History   Not on file  Tobacco Use   Smoking status: Never    Smokeless tobacco: Never  Substance and Sexual Activity   Alcohol use: No   Drug use: No   Sexual activity: Not on file  Other Topics Concern   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    Social Determinants of Health   Financial Resource Strain: Low Risk  (02/08/2023)   Overall Financial Resource Strain (CARDIA)    Difficulty of Paying Living Expenses: Not very hard  Food Insecurity: No Food Insecurity (02/08/2023)   Hunger Vital Sign    Worried About Running Out of Food in the Last Year: Never true    Ran Out of Food in the Last Year: Never true  Transportation Needs: No Transportation Needs (02/08/2023)   PRAPARE - Hydrologist (Medical): No    Lack of Transportation (Non-Medical): No  Physical Activity: Sufficiently Active (02/08/2023)   Exercise Vital Sign    Days of Exercise per Week: 7 days    Minutes of Exercise per Session: 30 min  Stress: No Stress Concern Present (02/08/2023)   [place]    Feeling of Stress : Not at all  Social Connections: Moderately Isolated (02/08/2023)   Social Connection and Isolation Panel [NHANES]    Frequency of Communication with Friends and Family: Once a week    Frequency of Social Gatherings with Friends and Family: Once a week    Attends Religious Services: More than 4 times per year    Active Member of [employer] or Organizations: Yes    Attends Music therapist: More than 4 times per year    Marital Status: Never married    Tobacco Counseling Counseling given: Not Answered   Clinical Intake:     Pain : No/denies pain     Diabetes: Yes CBG done?: No     Diabetic?yes         Activities of Daily Living    02/08/2023    9:57 AM  In your present state of health, do you have any difficulty performing the following activities:  Hearing? 0  Vision? 1   Difficulty concentrating or making decisions? 0  Walking or climbing stairs? 0  Dressing or bathing? 0  Doing errands, shopping? 0  Preparing Food and eating ? N  Using the Toilet? N  In the past six months, have you accidently leaked urine? Y  Do you have problems with loss of bowel control? N  Managing your Medications? N  Managing your Finances? N  Housekeeping or managing your Housekeeping? N  Patient Care Team: Ladell Pier, MD as PCP - General (Internal Medicine)  Indicate any recent Medical Services you may have received from other than [facility] providers in the past year (date may be approximate).     Assessment:   This is a routine wellness examination for [place].  Hearing/Vision screen No results found.  Dietary issues and exercise activities discussed:     Goals Addressed   None   Depression Screen    11/22/2022    9:07 AM 11/22/2022    9:00 AM 07/16/2022   11:49 AM 03/15/2022   10:53 AM 11/14/2021   10:40 AM 10/02/2021   11:16 AM 06/28/2021    3:20 PM  PHQ 2/9 Scores  PHQ - 2 Score 0 0 0 0 0 0 0  PHQ- 9 Score 1  0        Fall Risk    02/08/2023    9:57 AM 11/22/2022    9:00 AM 07/16/2022   11:24 AM 03/15/2022   10:52 AM 11/14/2021   10:40 AM  Fall Risk   Falls in the past year? 1 0 0 0 0  Number falls in past yr: 0 0 0 0 0  Injury with Fall? 0 0 0 0 0  Risk for fall due to : Other (Comment) No Fall Risks No Fall Risks No Fall Risks No Fall Risks  Follow up   Falls evaluation completed      FALL RISK PREVENTION PERTAINING TO THE HOME:  Any stairs in or around the home? No  If so, are there any without handrails? No  Home free of loose throw rugs in walkways, pet beds, electrical cords, etc? No  Adequate lighting in your home to reduce risk of falls? Yes   ASSISTIVE DEVICES UTILIZED TO PREVENT FALLS:  Life alert? Yes  Use of a cane, walker or w/c? No  Grab bars in the bathroom? No  Shower chair or bench in shower? Yes  Elevated  toilet seat or a handicapped toilet? No   TIMED UP AND GO:  Was the test performed? No .  Length of time to ambulate 10 feet:  sec.   Gait slow and steady without use of assistive device  Cognitive Function:    02/08/2023    9:59 AM 06/28/2021    3:23 PM 06/23/2020    8:48 AM  MMSE - Mini Mental State Exam  Orientation to time 5 5 5  $ Orientation to Place 5 5 5  $ Registration 3 3 3  $ Attention/ Calculation 5 5 4  $ Recall 3 2 1  $ Language- name 2 objects 2 2 2  $ Language- repeat 1 1 1  $ Language- follow 3 step command 3 3 3  $ Language- read & follow direction 1 1 1  $ Write a sentence 1 1 1  $ Copy design 1 1 1  $ Total score 30 29 27        $ 02/08/2023   10:01 AM  6CIT Screen  What Year? 0 points  What month? 0 points  What time? 0 points  Count back from 20 0 points  Months in reverse 0 points  Repeat phrase 0 points  Total Score 0 points    Immunizations Immunization History  Administered Date(s) Administered   Influenza Inj Mdck Quad Pf 11/22/2022   Influenza,inj,Quad PF,6+ Mos 10/20/2014, 09/13/2016, 10/22/2017, 08/29/2018, 11/04/2019, 09/29/2020, 10/02/2021   PFIZER(Purple Top)SARS-COV-2 Vaccination 03/10/2020, 04/05/2020, 11/14/2020   Pneumococcal Conjugate (Pcv15) 11/14/2021   Pneumococcal Polysaccharide-23 11/08/2016   Respiratory Syncytial Virus  Vaccine,Recomb Aduvanted(Arexvy) 11/22/2022   Tdap 12/20/2016   Zoster Recombinat (Shingrix) 06/23/2020, 11/03/2021    TDAP status: Up to date  Flu Vaccine status: Up to date  Pneumococcal vaccine status: Up to date  Covid-19 vaccine status: Completed vaccines  Qualifies for Shingles Vaccine? Yes   Zostavax completed Yes   Shingrix Completed?: Yes  Screening Tests Health Maintenance  Topic Date Due   Diabetic kidney evaluation - Urine ACR  06/01/2022   COVID-19 Vaccine (4 - 2023-24 season) 08/24/2022   OPHTHALMOLOGY EXAM  01/23/2023   FOOT EXAM  03/16/2023   MAMMOGRAM  05/11/2023   HEMOGLOBIN A1C  05/23/2023    Diabetic kidney evaluation - eGFR measurement  11/23/2023   Medicare Annual Wellness (AWV)  02/09/2024   COLONOSCOPY (Pts 45-67yr Insurance coverage will need to be confirmed)  03/23/2024   PAP SMEAR-Modifier  11/14/2026   DTaP/Tdap/Td (2 - Td or Tdap) 12/20/2026   INFLUENZA VACCINE  Completed   Hepatitis C Screening  Completed   HIV Screening  Completed   Zoster Vaccines- Shingrix  Completed   HPV VACCINES  Aged Out    Health Maintenance  Health Maintenance Due  Topic Date Due   Diabetic kidney evaluation - Urine ACR  06/01/2022   COVID-19 Vaccine (4 - 2023-24 season) 08/24/2022   OPHTHALMOLOGY EXAM  01/23/2023    Colorectal cancer screening: Type of screening: Colonoscopy. Completed 03/23/2014. Repeat every 10 years  Mammogram status: Completed 05/10/21. Repeat every year  Bone Density scan : Patient not of age   Lung Cancer Screening: (Low Dose CT Chest recommended if Age 63-80years, 30 pack-year currently smoking OR have quit w/in 15years.) does not qualify.   Lung Cancer Screening Referral: n/a  Additional Screening:  Hepatitis C Screening: does qualify; Completed 09/28/2019  Vision Screening: Recommended annual ophthalmology exams for early detection of glaucoma and other disorders of the eye. Is the patient up to date with their annual eye exam?  No  Who is the provider or what is the name of the office in which the patient attends annual eye exams? Dr.Shapiro If pt is not established with a provider, would they like to be referred to a provider to establish care?  N/a .   Dental Screening: Recommended annual dental exams for proper oral hygiene  Community Resource Referral / Chronic Care Management: CRR required this visit?  No   CCM required this visit?  No      Plan:     I have personally reviewed and noted the following in the patient's chart:   Medical and social history Use of alcohol, tobacco or illicit drugs  Current medications and supplements  including opioid prescriptions. Patient is not currently taking opioid prescriptions. Functional ability and status Nutritional status Physical activity Advanced directives List of other physicians Hospitalizations, surgeries, and ER visits in previous 12 months Vitals Screenings to include cognitive, depression, and falls Referrals and appointments  In addition, I have reviewed and discussed with patient certain preventive protocols, quality metrics, and best practice recommendations. A written personalized care plan for preventive services as well as general preventive health recommendations were provided to patient.     CLillie Columbia CMA   02/08/2023   Nurse Notes:

## 2023-02-10 ENCOUNTER — Other Ambulatory Visit: Payer: Self-pay | Admitting: Internal Medicine

## 2023-02-10 DIAGNOSIS — Z0000 Encounter for general adult medical examination without abnormal findings: Secondary | ICD-10-CM

## 2023-02-15 ENCOUNTER — Other Ambulatory Visit: Payer: Self-pay | Admitting: Internal Medicine

## 2023-02-15 DIAGNOSIS — E669 Obesity, unspecified: Secondary | ICD-10-CM

## 2023-02-18 NOTE — Telephone Encounter (Signed)
Labs all in date except B12.   Has office visit on 03/19/2023.  A 90 day supply given of 100 tablets.  Requested Prescriptions  Pending Prescriptions Disp Refills   metFORMIN (GLUCOPHAGE) 1000 MG tablet [Pharmacy Med Name: metFORMIN HCl 1000 MG Oral Tablet] 100 tablet 0    Sig: TAKE 1 TABLET BY MOUTH TWICE  DAILY WITH A MEAL     Endocrinology:  Diabetes - Biguanides Failed - 02/15/2023 10:33 PM      Failed - Cr in normal range and within 360 days    Creat  Date Value Ref Range Status  12/27/2015 0.94 0.50 - 1.05 mg/dL Final   Creatinine, Ser  Date Value Ref Range Status  11/22/2022 1.39 (H) 0.57 - 1.00 mg/dL Final   Creatinine, Urine  Date Value Ref Range Status  09/28/2019 87.86 mg/dL Final    Comment:    Performed at [facility], [place] [address], [place], [place] [address]         Failed - eGFR in normal range and within 360 days    GFR, Est African American  Date Value Ref Range Status  01/13/2015 >89 mL/min Final   GFR calc Af Amer  Date Value Ref Range Status  09/29/2020 50 (L) >59 mL/min/1.73 Final    Comment:    **[facility] currently reports eGFR in compliance with the current**   recommendations of the [employer]. [facility] will   update reporting as new guidelines are published from the NKF-ASN   Task force.    GFR, Est Non African American  Date Value Ref Range Status  01/13/2015 88 mL/min Final    Comment:      The estimated GFR is a calculation valid for adults (>=53 years old) that uses the CKD-EPI algorithm to adjust for age and sex. It is   not to be used for children, pregnant women, hospitalized patients,    patients on dialysis, or with rapidly changing kidney function. According to the NKDEP, eGFR >89 is normal, 60-89 shows mild impairment, 30-59 shows moderate impairment, 15-29 shows severe impairment and <15 is ESRD.      GFR calc non Af Amer  Date Value Ref Range Status  09/29/2020 43 (L) >59 mL/min/1.73 Final    eGFR  Date Value Ref Range Status  11/22/2022 43 (L) >59 mL/min/1.73 Final         Failed - B12 Level in normal range and within 720 days    Vitamin B-12  Date Value Ref Range Status  01/13/2018 433 232 - 1,245 pg/mL Final         Failed - CBC within normal limits and completed in the last 12 months    WBC  Date Value Ref Range Status  11/22/2022 6.6 3.4 - 10.8 x10E3/uL Final  10/02/2019 12.2 (H) 4.0 - 10.5 K/uL Final   RBC  Date Value Ref Range Status  11/22/2022 4.03 3.77 - 5.28 x10E6/uL Final  10/02/2019 3.65 (L) 3.87 - 5.11 MIL/uL Final   Hemoglobin  Date Value Ref Range Status  11/22/2022 11.5 11.1 - 15.9 g/dL Final   Hematocrit  Date Value Ref Range Status  11/22/2022 34.9 34.0 - 46.6 % Final   MCHC  Date Value Ref Range Status  11/22/2022 33.0 31.5 - 35.7 g/dL Final  10/02/2019 32.8 30.0 - 36.0 g/dL Final   [facility]  Date Value Ref Range Status  11/22/2022 28.5 26.6 - 33.0 pg Final  10/02/2019 28.2 26.0 - 34.0 pg Final  MCV  Date Value Ref Range Status  11/22/2022 87 79 - 97 fL Final   No results found for: "PLTCOUNTKUC", "LABPLAT", "POCPLA" RDW  Date Value Ref Range Status  11/22/2022 13.8 11.7 - 15.4 % Final         Passed - HBA1C is between 0 and 7.9 and within 180 days    HbA1c, POC (prediabetic range)  Date Value Ref Range Status  09/29/2020 6.4 5.7 - 6.4 % Final   HbA1c, POC (controlled diabetic range)  Date Value Ref Range Status  11/22/2022 6.6 0.0 - 7.0 % Final         Passed - Valid encounter within last 6 months    Recent Outpatient Visits           2 months ago Type 2 diabetes mellitus with obesity ([place])   [place] Karle Plumber B, MD   7 months ago Type 2 diabetes mellitus with obesity [facility])   [place] Karle Plumber B, MD   11 months ago Type 2 diabetes mellitus with obesity [facility])   [place] Ladell Pier, MD   1 year ago Pap smear for cervical cancer screening   [place], MD   1 year ago Type 2 diabetes mellitus with obesity [facility])   [place], MD       Future Appointments             In 4 weeks Ladell Pier, MD [place]

## 2023-03-04 ENCOUNTER — Other Ambulatory Visit: Payer: Self-pay | Admitting: Internal Medicine

## 2023-03-04 DIAGNOSIS — I10 Essential (primary) hypertension: Secondary | ICD-10-CM

## 2023-03-04 DIAGNOSIS — N951 Menopausal and female climacteric states: Secondary | ICD-10-CM

## 2023-03-05 NOTE — Telephone Encounter (Signed)
Requested medication (s) are due for refill today:   Yes for all 3  Requested medication (s) are on the active medication list:   Yes for all 3  Future visit scheduled:   Yes with Dr. Wynetta Emery   Last ordered: Amlodipine 05/10/2022 #100, 2 refills;   Losartan 05/10/2022 #100, 2 refills;   Effexor 12/11/2022 #100, 0 refills  Returned because a 1 yr. Supply is being requested by mail order pharmacy.   Has an upcoming appt.  03/19/2023.  Provider to review for  1 yr. Fill.  Requested Prescriptions  Pending Prescriptions Disp Refills   amLODipine (NORVASC) 10 MG tablet [Pharmacy Med Name: amLODIPine Besylate 10 MG Oral Tablet] 100 tablet 2    Sig: TAKE 1 TABLET BY MOUTH  DAILY     Cardiovascular: Calcium Channel Blockers 2 Passed - 03/04/2023 10:44 PM      Passed - Last BP in normal range    BP Readings from Last 1 Encounters:  11/22/22 130/82         Passed - Last Heart Rate in normal range    Pulse Readings from Last 1 Encounters:  11/22/22 83         Passed - Valid encounter within last 6 months    Recent Outpatient Visits           3 months ago Type 2 diabetes mellitus with obesity ([place])   [place] Karle Plumber B, MD   7 months ago Type 2 diabetes mellitus with obesity [facility])   [place] Karle Plumber B, MD   11 months ago Type 2 diabetes mellitus with obesity [facility])   [place] Ladell Pier, MD   1 year ago Pap smear for cervical cancer screening   [place], Deborah B, MD   1 year ago Type 2 diabetes mellitus with obesity [facility])   [place], MD       Future Appointments             In 2 weeks Ladell Pier, MD [place]             losartan ([place]) 50 MG tablet [Pharmacy Med Name: Losartan Potassium 50 MG  Oral Tablet] 100 tablet 2    Sig: TAKE 1 TABLET BY MOUTH ONCE  DAILY     Cardiovascular:  Angiotensin Receptor Blockers Failed - 03/04/2023 10:44 PM      Failed - Cr in normal range and within 180 days    Creat  Date Value Ref Range Status  12/27/2015 0.94 0.50 - 1.05 mg/dL Final   Creatinine, Ser  Date Value Ref Range Status  11/22/2022 1.39 (H) 0.57 - 1.00 mg/dL Final   Creatinine, Urine  Date Value Ref Range Status  09/28/2019 87.86 mg/dL Final    Comment:    Performed at [facility], [place] [address], [place], [place] [address]         Passed - K in normal range and within 180 days    Potassium  Date Value Ref Range Status  11/22/2022 4.4 3.5 - 5.2 mmol/L Final         Passed - Patient is not pregnant      Passed - Last BP in normal range    BP Readings from Last 1 Encounters:  11/22/22 130/82         Passed - Valid encounter within last 6 months    Recent Outpatient Visits           3 months ago Type 2 diabetes mellitus with obesity ([place])   [place] Karle Plumber B, MD   7 months ago Type 2 diabetes mellitus with obesity [facility])   [place] Karle Plumber B, MD   11 months ago Type 2 diabetes mellitus with obesity [facility])   [place] Ladell Pier, MD   1 year ago Pap smear for cervical cancer screening   [place] Karle Plumber B, MD   1 year ago Type 2 diabetes mellitus with obesity [facility])   [place], MD       Future Appointments             In 2 weeks Ladell Pier, MD [place]             venlafaxine XR (EFFEXOR-XR) 75 MG 24 hr capsule [Pharmacy Med Name: Venlafaxine HCl ER 75 MG Oral Capsule Extended Release 24 Hour] 100 capsule 2    Sig: TAKE 1 CAPSULE BY MOUTH DAILY  WITH  BREAKFAST     Psychiatry: Antidepressants - SNRI - desvenlafaxine & venlafaxine Failed - 03/04/2023 10:44 PM      Failed - Cr in normal range and within 360 days    Creat  Date Value Ref Range Status  12/27/2015 0.94 0.50 - 1.05 mg/dL Final   Creatinine, Ser  Date Value Ref Range Status  11/22/2022 1.39 (H) 0.57 - 1.00 mg/dL Final   Creatinine, Urine  Date Value Ref Range Status  09/28/2019 87.86 mg/dL Final    Comment:    Performed at [facility], [place] [address] [place], [place] [address]         Failed - Lipid Panel in normal range within the last 12 months    Cholesterol, Total  Date Value Ref Range Status  11/22/2022 174 100 - 199 mg/dL Final   LDL Chol Calc (NIH)  Date Value Ref Range Status  11/22/2022 98 0 - 99 mg/dL Final   HDL  Date Value Ref Range Status  11/22/2022 54 >39 mg/dL Final   Triglycerides  Date Value Ref Range Status  11/22/2022 124 0 - 149 mg/dL Final         Passed - Last BP in normal range    BP Readings from Last 1 Encounters:  11/22/22 130/82         Passed - Valid encounter within last 6 months    Recent Outpatient Visits           3 months ago Type 2 diabetes mellitus with obesity ([place])   [place] Karle Plumber B, MD   7 months ago Type 2 diabetes mellitus with obesity [facility])   [place] Karle Plumber B, MD   11 months ago Type 2 diabetes mellitus with obesity [facility])   [place] Ladell Pier, MD   1 year ago Pap smear for cervical cancer screening   [place] Ladell Pier, MD   1 year ago Type 2 diabetes  mellitus with obesity [facility])   [place], MD       Future Appointments             In 2 weeks Wynetta Emery [name], MD [place]

## 2023-03-19 ENCOUNTER — Encounter: Payer: Self-pay | Admitting: Internal Medicine

## 2023-03-19 ENCOUNTER — Ambulatory Visit: Payer: 59 | Attending: Internal Medicine | Admitting: Internal Medicine

## 2023-03-19 VITALS — BP 131/77 | HR 86 | Temp 98.4°F | Ht 61.0 in | Wt 178.0 lb

## 2023-03-19 DIAGNOSIS — N951 Menopausal and female climacteric states: Secondary | ICD-10-CM

## 2023-03-19 DIAGNOSIS — N1832 Chronic kidney disease, stage 3b: Secondary | ICD-10-CM

## 2023-03-19 DIAGNOSIS — E785 Hyperlipidemia, unspecified: Secondary | ICD-10-CM

## 2023-03-19 DIAGNOSIS — E669 Obesity, unspecified: Secondary | ICD-10-CM

## 2023-03-19 DIAGNOSIS — E1159 Type 2 diabetes mellitus with other circulatory complications: Secondary | ICD-10-CM

## 2023-03-19 DIAGNOSIS — E1169 Type 2 diabetes mellitus with other specified complication: Secondary | ICD-10-CM

## 2023-03-19 DIAGNOSIS — I739 Peripheral vascular disease, unspecified: Secondary | ICD-10-CM | POA: Diagnosis not present

## 2023-03-19 DIAGNOSIS — E1142 Type 2 diabetes mellitus with diabetic polyneuropathy: Secondary | ICD-10-CM

## 2023-03-19 DIAGNOSIS — I152 Hypertension secondary to endocrine disorders: Secondary | ICD-10-CM

## 2023-03-19 LAB — POCT GLYCOSYLATED HEMOGLOBIN (HGB A1C): HbA1c, POC (controlled diabetic range): 6.8 % (ref 0.0–7.0)

## 2023-03-19 LAB — GLUCOSE, POCT (MANUAL RESULT ENTRY): POC Glucose: 123 mg/dl — AB (ref 70–99)

## 2023-03-19 MED ORDER — METFORMIN HCL 1000 MG PO TABS: 1000.0000 mg | ORAL_TABLET | Freq: Two times a day (BID) | ORAL | 2 refills | Status: DC

## 2023-03-19 MED ORDER — VENLAFAXINE HCL ER 75 MG PO CP24: 75.0000 mg | ORAL_CAPSULE | Freq: Every day | ORAL | 2 refills | Status: DC

## 2023-03-19 MED ORDER — AMLODIPINE BESYLATE 10 MG PO TABS: 10.0000 mg | ORAL_TABLET | Freq: Every day | ORAL | 2 refills | Status: DC

## 2023-03-19 MED ORDER — LOSARTAN POTASSIUM 50 MG PO TABS: 50.0000 mg | ORAL_TABLET | Freq: Every day | ORAL | 2 refills | Status: DC

## 2023-03-19 NOTE — Progress Notes (Signed)
Patient ID: [name] Barker, female    DOB: Aug 18, 1961  MRN: [id number]  CC: Diabetes (DM f/u. /Intermittent pain on L leg X6 mo/Already recived flu vax. )   Subjective: [name] Barker is a 62 y.o. female who presents for chronic disease management Her concerns today include:  Patient with history of DM with microalbumin, HL, HTN, perimenopausal (On Effexor), anemia chronic ds, insomnia, CKD stage 3, BL complicated renal cyst, COVID pneumonia  C/o intermittent pain right below buttock BL x 6 mths.  Feels it when she does a lot of walking. Occurs after walking about 10-15 mins.  Usually stands still for about 5 mins when it occurs then continues walking. Describes it as a soreness, not getting worse.  No back pain.  No radiation of pain down legs.    DM: Results for orders placed or performed in visit on 03/19/23  POCT glucose (manual entry)  Result Value Ref Range   POC Glucose 123 (A) 70 - 99 mg/dl  POCT glycosylated hemoglobin (Hb A1C)  Result Value Ref Range   Hemoglobin A1C     HbA1c POC (<> result, manual entry)     HbA1c, POC (prediabetic range)     HbA1c, POC (controlled diabetic range) 6.8 0.0 - 7.0 %  Currently on Farxiga 10 mg daily and metformin 1 g twice a day.  Compliant with taking medications. Reports intermittent numbness and tingling in hands and feet lasting 15-20 mins at time x 6 mths.   Up 3 lbs since last visit.  Does ok with eating habits.  Goes to gym every other day. Does a little wgh training and walks on TM for 15 mins Checks BS once a day after dinner.  Gives range 150-170s. HL: Taking and tolerating Lipitor 30 mg daily.  HTN: Compliant with taking Norvasc 10 mg daily and Cozaar 50 mg daily. Took meds already for today. She limits salt in the foods.  CKD 3: Followed by Dr. Moshe Cipro.  Most recent GFR 43 with creatinine of 1.39.  Not on any NSAIDs.   Patient Active Problem List   Diagnosis Date Noted   Class 1 obesity due to excess calories with  serious comorbidity and body mass index (BMI) of 33.0 to 33.9 in adult 06/01/2021   CKD (chronic kidney disease) stage 3, GFR 30-59 ml/min ([place]) 11/30/2020   Raynaud's phenomenon without gangrene 09/29/2020   Anemia in stage 3a chronic kidney disease ([place]) 09/29/2020   Anemia, chronic disease 02/07/2020   COVID-19 virus infection 09/28/2019   Type II diabetes mellitus with renal manifestations ([place]) 09/28/2019   HTN (hypertension) 09/28/2019   HLD (hyperlipidemia) 09/28/2019   Insomnia due to medical condition 09/13/2016   Perimenopausal 09/13/2016   Hyperlipidemia associated with type 2 diabetes mellitus ([place]) 07/22/2014   Esophageal reflux 07/22/2014   Diabetes ([place]) 04/22/2014   Essential hypertension, benign 04/22/2014     Current Outpatient Medications on File Prior to Visit  Medication Sig Dispense Refill   Accu-Chek Softclix Lancets lancets Use as instructed 100 each 12   ASPIRIN LOW DOSE 81 MG EC tablet TAKE 1 TABLET (81 MG TOTAL) BY MOUTH DAILY. 90 tablet 6   atorvastatin (LIPITOR) 10 MG tablet TAKE 1 TABLET BY MOUTH DAILY  TAKE ALONG WITH ONE 20 MG TABLET FOR A TOTAL DOSE OF 30 MG DAILY 90 tablet 3   atorvastatin (LIPITOR) 20 MG tablet Take 1 tablet (20 mg total) by mouth daily. 90 tablet 3   Blood Glucose Monitoring Suppl (ACCU-CHEK GUIDE)  w/Device KIT UAD 1 kit 0   FARXIGA 10 MG TABS tablet Take 10 mg by mouth daily.     glucose blood (ACCU-CHEK GUIDE) test strip Use as instructed 100 each 12   Multiple Vitamin (MULTIVITAMIN WITH MINERALS) TABS tablet Take 1 tablet by mouth daily. 100 tablet 0   Omega-3 Fatty Acids (FISH OIL) 1000 MG CAPS Take 1 capsule (1,000 mg total) by mouth daily. 100 capsule 1   tetrahydrozoline 0.05 % ophthalmic solution Place 2 drops into both eyes daily as needed (for dry eyes).     No current facility-administered medications on file prior to visit.    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    Social Determinants of Health   Financial Resource Strain: Low Risk  (02/08/2023)   Overall Financial Resource Strain (CARDIA)    Difficulty of Paying Living Expenses: Not very hard  Food Insecurity: No Food Insecurity (02/08/2023)   Hunger Vital Sign    Worried About Running Out of Food in the Last Year: Never true    Ran Out of Food in the Last Year: Never true  Transportation Needs: No Transportation Needs (02/08/2023)   PRAPARE - Hydrologist (Medical): No    Lack of Transportation (Non-Medical): No  Physical Activity: Sufficiently Active (02/08/2023)   Exercise Vital Sign    Days of Exercise per Week: 7 days    Minutes of Exercise per Session: 30 min  Stress: No Stress Concern Present (02/08/2023)   [place]    Feeling of Stress : Not at all  Social Connections: Moderately Isolated (02/08/2023)   Social Connection and Isolation Panel [NHANES]    Frequency of Communication with Friends and Family: Once a week    Frequency of Social Gatherings with Friends and Family: Once a week    Attends Religious Services: More than 4 times per year    Active Member of [employer] or Organizations: Yes    Attends Music therapist: More than 4 times per year    Marital Status: Never married  Human resources officer Violence: Not on file    Family History  Problem Relation Age of Onset   Hypertension Mother    Hypertension Father    Heart  disease Father    Stroke Other    Diabetes Other    Arthritis Other    Hypertension Sister    Hypertension Brother    Colon cancer Neg Hx     Past Surgical History:  Procedure Laterality Date   CHOLECYSTECTOMY      ROS: Review of Systems Negative except as stated above  PHYSICAL EXAM: BP 131/77 (BP Location: Left Arm, Patient Position: Sitting, Cuff Size: Large)   Pulse 86   Temp 98.4 F (36.9 C) (Oral)   Ht 5\' 1"  (1.549 m)  Wt 178 lb (80.7 kg)   LMP 12/25/2015   SpO2 98%   BMI 33.63 kg/m   Wt Readings from Last 3 Encounters:  03/19/23 178 lb (80.7 kg)  11/22/22 175 lb (79.4 kg)  07/16/22 176 lb 9.6 oz (80.1 kg)    Physical Exam  General appearance - alert, well appearing, and in no distress Mental status - normal mood, behavior, speech, dress, motor activity, and thought processes Neck - supple, no significant adenopathy Chest - clear to auscultation, no wheezes, rales or rhonchi, symmetric air entry Heart - normal rate, regular rhythm, normal S1, S2, no murmurs, rubs, clicks or gallops Extremities - peripheral pulses normal including dorsalis pedis, posterior tibialis, popliteal and femoral pulses, no pedal edema, no clubbing or cyanosis Diabetic Foot Exam - Simple   Simple Foot Form Diabetic Foot exam was performed with the following findings: Yes 03/19/2023  9:56 AM  Visual Inspection No deformities, no ulcerations, no other skin breakdown bilaterally: Yes Sensation Testing Intact to touch and monofilament testing bilaterally: Yes Pulse Check Posterior Tibialis and Dorsalis pulse intact bilaterally: Yes Comments         Latest Ref Rng & Units 11/22/2022    9:54 AM 03/15/2022   11:49 AM 10/02/2021   12:11 PM  CMP  Glucose 70 - 99 mg/dL 100  103  113   BUN 8 - 27 mg/dL 20  24  20    Creatinine 0.57 - 1.00 mg/dL 1.39  1.37  1.29   Sodium 134 - 144 mmol/L 141  140  142   Potassium 3.5 - 5.2 mmol/L 4.4  4.3  4.8   Chloride 96 - 106 mmol/L 102  101  103    CO2 20 - 29 mmol/L 23  23  24    Calcium 8.7 - 10.3 mg/dL 9.4  9.7  10.2   Total Protein 6.0 - 8.5 g/dL 7.1   7.4   Total Bilirubin 0.0 - 1.2 mg/dL 0.7   0.5   Alkaline Phos 44 - 121 IU/L 81   99   AST 0 - 40 IU/L 20   20   ALT 0 - 32 IU/L 20   20    Lipid Panel     Component Value Date/Time   CHOL 174 11/22/2022 0954   TRIG 124 11/22/2022 0954   HDL 54 11/22/2022 0954   CHOLHDL 3.2 11/22/2022 0954   CHOLHDL 2.9 09/13/2016 0949   VLDL 23 09/13/2016 0949   LDLCALC 98 11/22/2022 0954    CBC    Component Value Date/Time   WBC 6.6 11/22/2022 0954   WBC 12.2 (H) 10/02/2019 0335   RBC 4.03 11/22/2022 0954   RBC 3.65 (L) 10/02/2019 0335   HGB 11.5 11/22/2022 0954   HCT 34.9 11/22/2022 0954   PLT 292 11/22/2022 0954   MCV 87 11/22/2022 0954   MCH 28.5 11/22/2022 0954   MCH 28.2 10/02/2019 0335   MCHC 33.0 11/22/2022 0954   MCHC 32.8 10/02/2019 0335   RDW 13.8 11/22/2022 0954   LYMPHSABS 2.8 11/04/2019 1001   MONOABS 1.0 10/02/2019 0335   EOSABS 0.5 (H) 11/04/2019 1001   BASOSABS 0.1 11/04/2019 1001    ASSESSMENT AND PLAN:  1. Type 2 diabetes mellitus with obesity (HCC) At goal.  Encouraged her to continue healthy eating habits and regular exercise.  Continue metformin and Farxiga. - POCT glucose (manual entry) - POCT glycosylated hemoglobin (Hb A1C) - Microalbumin / creatinine urine ratio - metFORMIN (GLUCOPHAGE) 1000 MG tablet; Take 1  tablet (1,000 mg total) by mouth 2 (two) times daily with a meal.  Dispense: 180 tablet; Refill: 2  2. Hypertension associated with diabetes ([place]) Close to goal.  Continue Norvasc and Cozaar. - amLODipine (NORVASC) 10 MG tablet; Take 1 tablet (10 mg total) by mouth daily.  Dispense: 90 tablet; Refill: 2 - losartan (COZAAR) 50 MG tablet; Take 1 tablet (50 mg total) by mouth daily.  Dispense: 90 tablet; Refill: 2  3. Claudication of gluteal region [facility]) Symptoms suggestive of claudication.  She denies any back pain that would suggest  pseudoclaudication from spinal stenosis.  We discussed sending her to vein and vascular but patient wants to hold off at this time since symptoms are not worsening.    4. Hyperlipidemia associated with type 2 diabetes mellitus (HCC) Continue atorvastatin 30 mg daily.  5. Stage 3b chronic kidney disease ([place]) Stable.  We will continue to monitor - Basic Metabolic Panel  6. Diabetic peripheral neuropathy (HCC) She has intermittent peripheral neuropathy symptoms most likely related to diabetes.  She declines trying gabapentin.  7. Perimenopausal Requested refill on Effexor which he takes for perimenopausal symptoms. - venlafaxine XR (EFFEXOR-XR) 75 MG 24 hr capsule; Take 1 capsule (75 mg total) by mouth daily with breakfast.  Dispense: 90 capsule; Refill: 2    Patient was given the opportunity to ask questions.  Patient verbalized understanding of the plan and was able to repeat key elements of the plan.   This documentation was completed using Radio producer.  Any transcriptional errors are unintentional.  Orders Placed This Encounter  Procedures   Microalbumin / creatinine urine ratio   Basic Metabolic Panel   POCT glucose (manual entry)   POCT glycosylated hemoglobin (Hb A1C)     Requested Prescriptions   Signed Prescriptions Disp Refills   amLODipine (NORVASC) 10 MG tablet 90 tablet 2    Sig: Take 1 tablet (10 mg total) by mouth daily.   losartan (COZAAR) 50 MG tablet 90 tablet 2    Sig: Take 1 tablet (50 mg total) by mouth daily.   metFORMIN (GLUCOPHAGE) 1000 MG tablet 180 tablet 2    Sig: Take 1 tablet (1,000 mg total) by mouth 2 (two) times daily with a meal.   venlafaxine XR (EFFEXOR-XR) 75 MG 24 hr capsule 90 capsule 2    Sig: Take 1 capsule (75 mg total) by mouth daily with breakfast.    Return in about 4 months (around 07/19/2023).  Karle Plumber, MD, FACP

## 2023-03-20 ENCOUNTER — Telehealth: Payer: Self-pay | Admitting: Internal Medicine

## 2023-03-20 LAB — BASIC METABOLIC PANEL
BUN/Creatinine Ratio: 14 (ref 12–28)
BUN: 19 mg/dL (ref 8–27)
CO2: 21 mmol/L (ref 20–29)
Calcium: 9.3 mg/dL (ref 8.7–10.3)
Chloride: 103 mmol/L (ref 96–106)
Creatinine, Ser: 1.4 mg/dL — ABNORMAL HIGH (ref 0.57–1.00)
Glucose: 117 mg/dL — ABNORMAL HIGH (ref 70–99)
Potassium: 4.2 mmol/L (ref 3.5–5.2)
Sodium: 141 mmol/L (ref 134–144)
eGFR: 43 mL/min/{1.73_m2} — ABNORMAL LOW (ref >59)

## 2023-03-20 LAB — MICROALBUMIN / CREATININE URINE RATIO
Creatinine, Urine: 56.7 mg/dL
Microalb/Creat Ratio: 461 mg/g creat — ABNORMAL HIGH (ref 0–29)
Microalbumin, Urine: 261.2 ug/mL

## 2023-03-20 NOTE — Telephone Encounter (Signed)
Patient called in for lab results. Please call back 

## 2023-03-20 NOTE — Telephone Encounter (Signed)
Pt called, LVMTCB to discuss lab results.

## 2023-03-21 NOTE — Telephone Encounter (Signed)
Called, spoke with patient in regard to lab results. Patient verbal understanding.

## 2023-03-21 NOTE — Telephone Encounter (Signed)
Noted  

## 2023-05-10 DIAGNOSIS — N390 Urinary tract infection, site not specified: Secondary | ICD-10-CM | POA: Diagnosis not present

## 2023-05-10 DIAGNOSIS — N281 Cyst of kidney, acquired: Secondary | ICD-10-CM | POA: Diagnosis not present

## 2023-05-10 DIAGNOSIS — N183 Chronic kidney disease, stage 3 unspecified: Secondary | ICD-10-CM | POA: Diagnosis not present

## 2023-05-10 DIAGNOSIS — I129 Hypertensive chronic kidney disease with stage 1 through stage 4 chronic kidney disease, or unspecified chronic kidney disease: Secondary | ICD-10-CM | POA: Diagnosis not present

## 2023-05-10 DIAGNOSIS — E1122 Type 2 diabetes mellitus with diabetic chronic kidney disease: Secondary | ICD-10-CM | POA: Diagnosis not present

## 2023-05-13 LAB — LAB REPORT - SCANNED
Creatinine, POC: 66.5 mg/dL
EGFR: 39

## 2023-06-18 ENCOUNTER — Other Ambulatory Visit: Payer: Self-pay | Admitting: Internal Medicine

## 2023-06-18 DIAGNOSIS — Z1231 Encounter for screening mammogram for malignant neoplasm of breast: Secondary | ICD-10-CM

## 2023-07-17 ENCOUNTER — Ambulatory Visit: Admission: RE | Admit: 2023-07-17 | Payer: 59 | Source: Ambulatory Visit

## 2023-07-17 DIAGNOSIS — Z1231 Encounter for screening mammogram for malignant neoplasm of breast: Secondary | ICD-10-CM | POA: Diagnosis not present

## 2023-07-22 ENCOUNTER — Ambulatory Visit: Payer: 59 | Attending: Internal Medicine | Admitting: Internal Medicine

## 2023-07-22 ENCOUNTER — Encounter: Payer: Self-pay | Admitting: Internal Medicine

## 2023-07-22 VITALS — BP 123/74 | HR 91 | Temp 98.1°F | Ht 61.0 in | Wt 182.0 lb

## 2023-07-22 DIAGNOSIS — H6121 Impacted cerumen, right ear: Secondary | ICD-10-CM

## 2023-07-22 DIAGNOSIS — E669 Obesity, unspecified: Secondary | ICD-10-CM

## 2023-07-22 DIAGNOSIS — I152 Hypertension secondary to endocrine disorders: Secondary | ICD-10-CM

## 2023-07-22 DIAGNOSIS — E1169 Type 2 diabetes mellitus with other specified complication: Secondary | ICD-10-CM | POA: Diagnosis not present

## 2023-07-22 DIAGNOSIS — E1159 Type 2 diabetes mellitus with other circulatory complications: Secondary | ICD-10-CM | POA: Diagnosis not present

## 2023-07-22 DIAGNOSIS — N1832 Chronic kidney disease, stage 3b: Secondary | ICD-10-CM | POA: Diagnosis not present

## 2023-07-22 DIAGNOSIS — E785 Hyperlipidemia, unspecified: Secondary | ICD-10-CM | POA: Diagnosis not present

## 2023-07-22 DIAGNOSIS — L299 Pruritus, unspecified: Secondary | ICD-10-CM

## 2023-07-22 LAB — GLUCOSE, POCT (MANUAL RESULT ENTRY): POC Glucose: 130 mg/dl — AB (ref 70–99)

## 2023-07-22 LAB — POCT GLYCOSYLATED HEMOGLOBIN (HGB A1C): HbA1c, POC (controlled diabetic range): 7.3 % — AB (ref 0.0–7.0)

## 2023-07-22 MED ORDER — METFORMIN HCL 500 MG PO TABS: 500.0000 mg | ORAL_TABLET | Freq: Two times a day (BID) | ORAL | 1 refills | Status: DC

## 2023-07-22 MED ORDER — GLIMEPIRIDE 2 MG PO TABS: 2.0000 mg | ORAL_TABLET | Freq: Every day | ORAL | 1 refills | Status: DC

## 2023-07-22 NOTE — Progress Notes (Signed)
Patient ID: [name] Barker, female    DOB: 09/06/1961  MRN: [id number]  CC: Diabetes (DM & HTN f/u. /Itchiness on on R & L ear X1 mo/)   Subjective: [name] Barker is a 62 y.o. female who presents for chronic disease management Her concerns today include:  Patient with history of DM (with macroalbumin, peripheral neuropathy) HL, HTN, perimenopausal (On Effexor), anemia chronic ds, insomnia, CKD stage 3, BL complicated renal cyst, COVID pneumonia   DM: Results for orders placed or performed in visit on 07/22/23  POCT glucose (manual entry)  Result Value Ref Range   POC Glucose 130 (A) 70 - 99 mg/dl  POCT glycosylated hemoglobin (Hb A1C)  Result Value Ref Range   Hemoglobin A1C     HbA1c POC (<> result, manual entry)     HbA1c, POC (prediabetic range)     HbA1c, POC (controlled diabetic range) 7.3 (A) 0.0 - 7.0 %  A1c increased from last visit when it was 6.8. Currently on Farxiga 10 mg daily and metformin 1 g twice a day. Compliant with taking medications.  Checks BS once a day after lunch; gives range 140-160s. Doing okay with eating habits.  Eats oatmeal or Honey Nut Cereal  Walks at [employer] 2x/wk for 30 mins. Referred for eye exam last visit.  Appt scheduled 09/03/2023 with [facility].    HTN: Compliant with taking Norvasc 10 mg daily and Cozaar 50 mg daily. Took meds already for today. She limits salt in the foods.  No CP/SOB/LE edema  HL: Taking and tolerating atorvastatin 30 mg daily.  CKD 3b: GFR range has been 43-48 over the past year.  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 Last seen 04/2023 and GFR had declined to 39.   She is on Cozaar and [place].   C/o intermittent itching both ears RT>L x 1 mth.   No drainage.  No decrease hearing.   No itchy throat or eyes.   Patient Active Problem List   Diagnosis Date Noted   Claudication of gluteal region [facility]) 03/19/2023   Class 1 obesity due to excess calories  with serious comorbidity and body mass index (BMI) of 33.0 to 33.9 in adult 06/01/2021   CKD (chronic kidney disease) stage 3, GFR 30-59 ml/min (HCC) 11/30/2020   Raynaud's phenomenon without gangrene 09/29/2020   Anemia in stage 3a chronic kidney disease (HCC) 09/29/2020   Anemia, chronic disease 02/07/2020   COVID-19 virus infection 09/28/2019   Type II diabetes mellitus with renal manifestations (HCC) 09/28/2019   HTN (hypertension) 09/28/2019   HLD (hyperlipidemia) 09/28/2019   Insomnia due to medical condition 09/13/2016   Perimenopausal 09/13/2016   Hyperlipidemia associated with type 2 diabetes mellitus (HCC) 07/22/2014   Esophageal reflux 07/22/2014   Diabetes (HCC) 04/22/2014   Essential hypertension, benign 04/22/2014     Current Outpatient Medications on File Prior to Visit  Medication Sig Dispense Refill   Accu-Chek Softclix Lancets lancets Use as instructed 100 each 12   amLODipine (NORVASC) 10 MG tablet Take 1 tablet (10 mg total) by mouth daily. 90 tablet 2   ASPIRIN LOW DOSE 81 MG EC tablet TAKE 1 TABLET (81 MG TOTAL) BY MOUTH DAILY. 90 tablet 6   atorvastatin (LIPITOR) 10 MG tablet TAKE 1 TABLET BY MOUTH DAILY  TAKE ALONG WITH ONE 20 MG TABLET FOR A TOTAL DOSE OF 30 MG DAILY 90 tablet 3   atorvastatin (LIPITOR) 20 MG tablet Take 1 tablet (20  mg total) by mouth daily. 90 tablet 3   Blood Glucose Monitoring Suppl (ACCU-CHEK GUIDE) w/Device KIT UAD 1 kit 0   FARXIGA 10 MG TABS tablet Take 10 mg by mouth daily.     glucose blood (ACCU-CHEK GUIDE) test strip Use as instructed 100 each 12   losartan (COZAAR) 50 MG tablet Take 1 tablet (50 mg total) by mouth daily. 90 tablet 2   Multiple Vitamin (MULTIVITAMIN WITH MINERALS) TABS tablet Take 1 tablet by mouth daily. 100 tablet 0   Omega-3 Fatty Acids (FISH OIL) 1000 MG CAPS Take 1 capsule (1,000 mg total) by mouth daily. 100 capsule 1   tetrahydrozoline 0.05 % ophthalmic solution Place 2 drops into both eyes daily as needed  (for dry eyes).     venlafaxine XR (EFFEXOR-XR) 75 MG 24 hr capsule Take 1 capsule (75 mg total) by mouth daily with breakfast. 90 capsule 2   No current facility-administered medications on file prior to visit.    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    Social Determinants of Health   Financial Resource Strain: Low Risk  (02/08/2023)   Overall Financial Resource Strain (CARDIA)    Difficulty of Paying Living Expenses: Not very hard  Food Insecurity: No Food Insecurity (02/08/2023)   Hunger Vital Sign    Worried About Running Out of Food in the Last Year: Never true    Ran Out of Food in the Last Year: Never true  Transportation Needs: No Transportation Needs (02/08/2023)   PRAPARE - Administrator, Civil Service (Medical): No    Lack of Transportation (Non-Medical): No  Physical Activity: Sufficiently Active (02/08/2023)   Exercise Vital Sign    Days of Exercise per Week: 7 days    Minutes of Exercise per Session: 30 min  Stress: No Stress Concern Present (02/08/2023)   [employer] of Occupational Health - Occupational Stress Questionnaire    Feeling of Stress : Not at all  Social Connections: Moderately Isolated (02/08/2023)   Social Connection and Isolation Panel [NHANES]    Frequency of Communication with Friends and Family: Once a week    Frequency of Social Gatherings with Friends and Family: Once a week    Attends Religious Services: More than 4 times per  year    Active Member of [employer] or Organizations: Yes    Attends Engineer, structural: More than 4 times per year    Marital Status: Never married  Catering manager Violence: Not on file    Family History  Problem Relation Age of Onset   Hypertension Mother    Hypertension Father    Heart disease Father    Stroke Other    Diabetes Other    Arthritis Other    Hypertension Sister    Hypertension Brother    Colon cancer Neg Hx  Past Surgical History:  Procedure Laterality Date   CHOLECYSTECTOMY      ROS: Review of Systems Negative except as stated above  PHYSICAL EXAM: BP 123/74 (BP Location: Left Arm, Patient Position: Sitting, Cuff Size: Normal)   Pulse 91   Temp 98.1 F (36.7 C) (Oral)   Ht 5\' 1"  (1.549 m)   Wt 182 lb (82.6 kg)   LMP 12/25/2015   SpO2 99%   BMI 34.39 kg/m   Wt Readings from Last 3 Encounters:  07/22/23 182 lb (82.6 kg)  03/19/23 178 lb (80.7 kg)  11/22/22 175 lb (79.4 kg)    Physical Exam  General appearance - alert, well appearing, older African-American female and in no distress Mental status - normal mood, behavior, speech, dress, motor activity, and thought processes Neck - supple, no significant adenopathy Ears: Moderate amount of soft wax in right ear canal obscuring view of the eardrum.  Left ear canal and tympanic membrane within normal limits. Chest - clear to auscultation, no wheezes, rales or rhonchi, symmetric air entry Heart - normal rate, regular rhythm, normal S1, S2, no murmurs, rubs, clicks or gallops Extremities - peripheral pulses normal, no pedal edema, no clubbing or cyanosis      Latest Ref Rng & Units 03/19/2023   10:09 AM 11/22/2022    9:54 AM 03/15/2022   11:49 AM  CMP  Glucose 70 - 99 mg/dL 324  401  027   BUN 8 - 27 mg/dL 19  20  24    Creatinine 0.57 - 1.00 mg/dL 2.53  6.64  4.03   Sodium 134 - 144 mmol/L 141  141  140   Potassium 3.5 - 5.2 mmol/L 4.2  4.4  4.3   Chloride 96 - 106 mmol/L 103  102   101   CO2 20 - 29 mmol/L 21  23  23    Calcium 8.7 - 10.3 mg/dL 9.3  9.4  9.7   Total Protein 6.0 - 8.5 g/dL  7.1    Total Bilirubin 0.0 - 1.2 mg/dL  0.7    Alkaline Phos 44 - 121 IU/L  81    AST 0 - 40 IU/L  20    ALT 0 - 32 IU/L  20     Lipid Panel     Component Value Date/Time   CHOL 174 11/22/2022 0954   TRIG 124 11/22/2022 0954   HDL 54 11/22/2022 0954   CHOLHDL 3.2 11/22/2022 0954   CHOLHDL 2.9 09/13/2016 0949   VLDL 23 09/13/2016 0949   LDLCALC 98 11/22/2022 0954    CBC    Component Value Date/Time   WBC 6.6 11/22/2022 0954   WBC 12.2 (H) 10/02/2019 0335   RBC 4.03 11/22/2022 0954   RBC 3.65 (L) 10/02/2019 0335   HGB 11.5 11/22/2022 0954   HCT 34.9 11/22/2022 0954   PLT 292 11/22/2022 0954   MCV 87 11/22/2022 0954   MCH 28.5 11/22/2022 0954   MCH 28.2 10/02/2019 0335   MCHC 33.0 11/22/2022 0954   MCHC 32.8 10/02/2019 0335   RDW 13.8 11/22/2022 0954   LYMPHSABS 2.8 11/04/2019 1001   MONOABS 1.0 10/02/2019 0335   EOSABS 0.5 (H) 11/04/2019 1001   BASOSABS 0.1 11/04/2019 1001    ASSESSMENT AND PLAN: 1. Type 2 diabetes mellitus with obesity (HCC) Not at goal.  Our goal is to keep A1c less than 7. With slow decline in kidney function and GFR now in the 30s, I think we need to cut back on  the metformin from 1 g twice a day to 500 mg twice a day. Discussed putting her on GLP 1 agonist like Trulicity or Ozempic.  Patient declines stating that she does not like needles even if it is once a week injection.  Looks like her insurance does not cover Rybelsus.  We decided to add a low-dose of Amaryl instead.  So she will be on Farxiga 10 mg daily, metformin 500 mg twice a day and Amaryl 2 mg daily. -Check blood sugars daily before breakfast with goal being 90-130. Went over signs and symptoms of hypoglycemia and how to treat.  Let me know if any significant/frequent hypoglycemic episodes once she starts Amaryl. - POCT glucose (manual entry) - POCT glycosylated hemoglobin (Hb  A1C) - glimepiride (AMARYL) 2 MG tablet; Take 1 tablet (2 mg total) by mouth daily before breakfast.  Dispense: 90 tablet; Refill: 1 - metFORMIN (GLUCOPHAGE) 500 MG tablet; Take 1 tablet (500 mg total) by mouth 2 (two) times daily with a meal.  Dispense: 180 tablet; Refill: 1  2. Hypertension associated with diabetes (HCC) At goal.  3. Hyperlipidemia associated with type 2 diabetes mellitus (HCC) Continue atorvastatin 30 mg daily.  4. Stage 3b chronic kidney disease (HCC) Slow but steady decline in GFR.  Continue to monitor.  Decrease dose of metformin.  5. Impacted cerumen of right ear Right ear was flushed today by my CMA.  She still had a small amount of wax remaining but much reduced than previous  6. Itching of ear Advised patient to use a little bit of hydrocortisone cream OTC on the tip of the Q-tip and purulent at the opening of both ear canals as needed for itching.     Patient was given the opportunity to ask questions.  Patient verbalized understanding of the plan and was able to repeat key elements of the plan.   This documentation was completed using Paediatric nurse.  Any transcriptional errors are unintentional.  Orders Placed This Encounter  Procedures   POCT glucose (manual entry)   POCT glycosylated hemoglobin (Hb A1C)     Requested Prescriptions   Signed Prescriptions Disp Refills   glimepiride (AMARYL) 2 MG tablet 90 tablet 1    Sig: Take 1 tablet (2 mg total) by mouth daily before breakfast.   metFORMIN (GLUCOPHAGE) 500 MG tablet 180 tablet 1    Sig: Take 1 tablet (500 mg total) by mouth 2 (two) times daily with a meal.    Return in about 4 months (around 11/22/2023).  Jonah Blue, MD, FACP

## 2023-07-22 NOTE — Patient Instructions (Signed)
Because of your kidney function, we need to decrease the metformin to 500 mg twice a day.  We have added another medication called glimepiride 2 mg daily.  Try to check your blood sugars every day before breakfast with goal being 90-130.  Please let me know if you start experiencing any low blood sugar episodes on the glimepiride.

## 2023-08-13 ENCOUNTER — Other Ambulatory Visit: Payer: Self-pay | Admitting: Internal Medicine

## 2023-08-13 DIAGNOSIS — E1169 Type 2 diabetes mellitus with other specified complication: Secondary | ICD-10-CM

## 2023-09-03 DIAGNOSIS — H2513 Age-related nuclear cataract, bilateral: Secondary | ICD-10-CM | POA: Diagnosis not present

## 2023-09-03 DIAGNOSIS — E119 Type 2 diabetes mellitus without complications: Secondary | ICD-10-CM | POA: Diagnosis not present

## 2023-09-03 DIAGNOSIS — H04123 Dry eye syndrome of bilateral lacrimal glands: Secondary | ICD-10-CM | POA: Diagnosis not present

## 2023-09-03 DIAGNOSIS — H40013 Open angle with borderline findings, low risk, bilateral: Secondary | ICD-10-CM | POA: Diagnosis not present

## 2023-09-03 LAB — HM DIABETES EYE EXAM

## 2023-09-06 ENCOUNTER — Other Ambulatory Visit: Payer: Self-pay | Admitting: Internal Medicine

## 2023-09-06 DIAGNOSIS — E1159 Type 2 diabetes mellitus with other circulatory complications: Secondary | ICD-10-CM

## 2023-09-09 NOTE — Telephone Encounter (Signed)
Request is too soon, last refill 03/17/23 for 90 and 2 refills.  Requested Prescriptions  Pending Prescriptions Disp Refills   amLODipine (NORVASC) 10 MG tablet [Pharmacy Med Name: amLODIPine Besylate 10 MG Oral Tablet] 100 tablet 2    Sig: TAKE 1 TABLET BY MOUTH DAILY     Cardiovascular: Calcium Channel Blockers 2 Passed - 09/06/2023 10:44 PM      Passed - Last BP in normal range    BP Readings from Last 1 Encounters:  07/22/23 123/74         Passed - Last Heart Rate in normal range    Pulse Readings from Last 1 Encounters:  07/22/23 91         Passed - Valid encounter within last 6 months    Recent Outpatient Visits           1 month ago Type 2 diabetes mellitus with obesity (HCC)   [facility] Jonah Blue B, MD   5 months ago Type 2 diabetes mellitus with obesity [facility])   [place] Jonah Blue B, MD   9 months ago Type 2 diabetes mellitus with [facility])   [place] [facility] Jonah Blue B, MD   1 year ago Type 2 diabetes mellitus with obesity [facility])   [place] [facility] Jonah Blue B, MD   1 year ago Type 2 diabetes mellitus with obesity [facility])   [place] [facility] Marcine Matar, MD       Future Appointments             In 2 months Marcine Matar, MD [facility]             losartan (COZAAR) 50 MG tablet [Pharmacy Med Name: Losartan Potassium 50 MG Oral Tablet] 100 tablet 2    Sig: TAKE 1 TABLET BY MOUTH DAILY     Cardiovascular:  Angiotensin Receptor Blockers Passed - 09/06/2023 10:44 PM      Passed - Cr in normal range and within 180 days    Creat  Date Value Ref Range Status  12/27/2015 0.94 0.50 - 1.05 mg/dL Final   Creatinine, Ser  Date Value Ref Range Status  03/19/2023 1.40 (H) 0.57 - 1.00 mg/dL Final   Creatinine, POC  Date Value  Ref Range Status  05/13/2023 66.5 mg/dL Final    Comment:    abstracted by HIM   Creatinine, Urine  Date Value Ref Range Status  09/28/2019 87.86 mg/dL Final    Comment:    Performed at [facility], [address] [place], [place] [address]         Passed - K in normal range and within 180 days    Potassium  Date Value Ref Range Status  03/19/2023 4.2 3.5 - 5.2 mmol/L Final         Passed - Patient is not pregnant      Passed - Last BP in normal range    BP Readings from Last 1 Encounters:  07/22/23 123/74         Passed - Valid encounter within last 6 months    Recent Outpatient Visits           1 month ago Type 2 diabetes mellitus with obesity [facility])   [place] [facility] Marcine Matar, MD  5 months ago Type 2 diabetes mellitus with obesity [facility])   [facility] Jonah Blue B, MD   9 months ago Type 2 diabetes mellitus with obesity [facility])   [place] [facility] Jonah Blue B, MD   1 year ago Type 2 diabetes mellitus with obesity [facility])   [place] [facility] Marcine Matar, MD   1 year ago Type 2 diabetes mellitus with obesity [facility])   [facility] Marcine Matar, MD       Future Appointments             In 2 months Laural Benes, Binnie Rail, MD [facility]

## 2023-09-26 ENCOUNTER — Other Ambulatory Visit: Payer: Self-pay | Admitting: Internal Medicine

## 2023-09-26 DIAGNOSIS — E1169 Type 2 diabetes mellitus with other specified complication: Secondary | ICD-10-CM

## 2023-09-26 NOTE — Telephone Encounter (Signed)
Request is too soon for refill, last refill 07/22/23 for 90 and 1 refill.  Requested Prescriptions  Pending Prescriptions Disp Refills   metFORMIN (GLUCOPHAGE) 500 MG tablet [Pharmacy Med Name: metFORMIN HCl 500 MG Oral Tablet] 200 tablet 2    Sig: TAKE 1 TABLET BY MOUTH TWICE  DAILY WITH MEALS     Endocrinology:  Diabetes - Biguanides Failed - 09/26/2023  4:31 AM      Failed - B12 Level in normal range and within 720 days    Vitamin B-12  Date Value Ref Range Status  01/13/2018 433 232 - 1,245 pg/mL Final         Failed - CBC within normal limits and completed in the last 12 months    WBC  Date Value Ref Range Status  11/22/2022 6.6 3.4 - 10.8 x10E3/uL Final  10/02/2019 12.2 (H) 4.0 - 10.5 K/uL Final   RBC  Date Value Ref Range Status  11/22/2022 4.03 3.77 - 5.28 x10E6/uL Final  10/02/2019 3.65 (L) 3.87 - 5.11 MIL/uL Final   Hemoglobin  Date Value Ref Range Status  11/22/2022 11.5 11.1 - 15.9 g/dL Final   Hematocrit  Date Value Ref Range Status  11/22/2022 34.9 34.0 - 46.6 % Final   MCHC  Date Value Ref Range Status  11/22/2022 33.0 31.5 - 35.7 g/dL Final  40/98/1191 47.8 30.0 - 36.0 g/dL Final   [facility]  Date Value Ref Range Status  11/22/2022 28.5 26.6 - 33.0 pg Final  10/02/2019 28.2 26.0 - 34.0 pg Final   MCV  Date Value Ref Range Status  11/22/2022 87 79 - 97 fL Final   No results found for: "PLTCOUNTKUC", "LABPLAT", "POCPLA" RDW  Date Value Ref Range Status  11/22/2022 13.8 11.7 - 15.4 % Final         Passed - Cr in normal range and within 360 days    Creat  Date Value Ref Range Status  12/27/2015 0.94 0.50 - 1.05 mg/dL Final   Creatinine, Ser  Date Value Ref Range Status  03/19/2023 1.40 (H) 0.57 - 1.00 mg/dL Final   Creatinine, POC  Date Value Ref Range Status  05/13/2023 66.5 mg/dL Final    Comment:    abstracted by HIM   Creatinine, Urine  Date Value Ref Range Status  09/28/2019 87.86 mg/dL Final    Comment:    Performed at [facility], [address], [place], [place] [address]         Passed - HBA1C is between 0 and 7.9 and within 180 days    HbA1c, POC (prediabetic range)  Date Value Ref Range Status  09/29/2020 6.4 5.7 - 6.4 % Final   HbA1c, POC (controlled diabetic range)  Date Value Ref Range Status  07/22/2023 7.3 (A) 0.0 - 7.0 % Final         Passed - eGFR in normal range and within 360 days    GFR, Est African American  Date Value Ref Range Status  01/13/2015 >89 mL/min Final   GFR calc Af Amer  Date Value Ref Range Status  09/29/2020 50 (L) >59 mL/min/1.73 Final    Comment:    **[facility] currently reports eGFR in compliance with the current**   recommendations of the [employer]. [facility] will   update reporting as new guidelines are published from the NKF-ASN   Task force.    GFR, Est Non African American  Date Value Ref Range Status  01/13/2015 88 mL/min Final  Comment:      The estimated GFR is a calculation valid for adults (>=22 years old) that uses the CKD-EPI algorithm to adjust for age and sex. It is   not to be used for children, pregnant women, hospitalized patients,    patients on dialysis, or with rapidly changing kidney function. According to the NKDEP, eGFR >89 is normal, 60-89 shows mild impairment, 30-59 shows moderate impairment, 15-29 shows severe impairment and <15 is ESRD.      GFR calc non Af Amer  Date Value Ref Range Status  09/29/2020 43 (L) >59 mL/min/1.73 Final   eGFR  Date Value Ref Range Status  03/19/2023 43 (L) >59 mL/min/1.73 Final   EGFR  Date Value Ref Range Status  05/13/2023 39.0  Final    Comment:    abstracted by HIM         Passed - Valid encounter within last 6 months    Recent Outpatient Visits           2 months ago Type 2 diabetes mellitus with obesity [facility])   [place] [facility] Jonah Blue B, MD   6 months ago Type 2 diabetes mellitus with obesity [facility])   [facility] Jon[facility] Blue B, MD   10 months ago Type 2 diabetes mellitus with obesity [facility])   [place] [facility] Jonah Blue B, MD   1 year ago Type 2 diabetes mellitus with obesity [facility])   [place] [facility] Jonah Blue B, MD   1 year ago Type 2 diabetes mellitus with obesity [facility])   [place] [facility] Marcine Matar, MD       Future Appointments             In 2 months Laural Benes, Binnie Rail, MD [facility]

## 2023-09-29 ENCOUNTER — Other Ambulatory Visit: Payer: Self-pay | Admitting: Internal Medicine

## 2023-09-29 DIAGNOSIS — E1169 Type 2 diabetes mellitus with other specified complication: Secondary | ICD-10-CM

## 2023-09-30 NOTE — Telephone Encounter (Signed)
Requested Prescriptions  Refused Prescriptions Disp Refills   glimepiride (AMARYL) 2 MG tablet [Pharmacy Med Name: Glimepiride 2 MG Oral Tablet] 100 tablet 2    Sig: TAKE 1 TABLET BY MOUTH DAILY  BEFORE BREAKFAST     Endocrinology:  Diabetes - Sulfonylureas Passed - 09/29/2023  5:19 AM      Passed - HBA1C is between 0 and 7.9 and within 180 days    HbA1c, POC (prediabetic range)  Date Value Ref Range Status  09/29/2020 6.4 5.7 - 6.4 % Final   HbA1c, POC (controlled diabetic range)  Date Value Ref Range Status  07/22/2023 7.3 (A) 0.0 - 7.0 % Final         Passed - Cr in normal range and within 360 days    Creat  Date Value Ref Range Status  12/27/2015 0.94 0.50 - 1.05 mg/dL Final   Creatinine, Ser  Date Value Ref Range Status  03/19/2023 1.40 (H) 0.57 - 1.00 mg/dL Final   Creatinine, POC  Date Value Ref Range Status  05/13/2023 66.5 mg/dL Final    Comment:    abstracted by HIM   Creatinine, Urine  Date Value Ref Range Status  09/28/2019 87.86 mg/dL Final    Comment:    Performed at [facility], [address], [place], [place] [address]         Passed - Valid encounter within last 6 months    Recent Outpatient Visits           2 months ago Type 2 diabetes mellitus with obesity [facility])   [place] [facility] Jonah Blue B, MD   6 months ago Type 2 diabetes mellitus with obesity [facility])   [place] [facility] Jon[facility] Blue B, MD   10 months ago Type 2 diabetes mellitus with obesity [facility])   [place] [facility] Marcine Matar, MD   1 year ago Type 2 diabetes mellitus with obesity [facility])   [place] [facility] Marcine Matar, MD   1 year ago Type 2 diabetes mellitus with obesity [facility])   [place] [facility] Marcine Matar, MD       Future Appointments             In 1 month Laural Benes, Binnie Rail, MD [facility]

## 2023-11-07 ENCOUNTER — Other Ambulatory Visit: Payer: Self-pay | Admitting: Internal Medicine

## 2023-11-07 DIAGNOSIS — N951 Menopausal and female climacteric states: Secondary | ICD-10-CM

## 2023-11-11 ENCOUNTER — Telehealth: Payer: Self-pay | Admitting: Internal Medicine

## 2023-11-11 ENCOUNTER — Other Ambulatory Visit: Payer: Self-pay | Admitting: Internal Medicine

## 2023-11-11 DIAGNOSIS — N183 Chronic kidney disease, stage 3 unspecified: Secondary | ICD-10-CM | POA: Diagnosis not present

## 2023-11-11 DIAGNOSIS — E1159 Type 2 diabetes mellitus with other circulatory complications: Secondary | ICD-10-CM

## 2023-11-11 DIAGNOSIS — E1122 Type 2 diabetes mellitus with diabetic chronic kidney disease: Secondary | ICD-10-CM | POA: Diagnosis not present

## 2023-11-11 DIAGNOSIS — N281 Cyst of kidney, acquired: Secondary | ICD-10-CM | POA: Diagnosis not present

## 2023-11-11 DIAGNOSIS — I129 Hypertensive chronic kidney disease with stage 1 through stage 4 chronic kidney disease, or unspecified chronic kidney disease: Secondary | ICD-10-CM | POA: Diagnosis not present

## 2023-11-11 DIAGNOSIS — D631 Anemia in chronic kidney disease: Secondary | ICD-10-CM | POA: Diagnosis not present

## 2023-11-11 NOTE — Telephone Encounter (Signed)
Pt is calling to follow up on [place] paperwork. Paperwork was drop off on Thursday

## 2023-11-12 ENCOUNTER — Other Ambulatory Visit: Payer: Self-pay | Admitting: Internal Medicine

## 2023-11-12 DIAGNOSIS — I152 Hypertension secondary to endocrine disorders: Secondary | ICD-10-CM

## 2023-11-12 LAB — LAB REPORT - SCANNED: EGFR: 38

## 2023-11-13 ENCOUNTER — Other Ambulatory Visit: Payer: Self-pay | Admitting: Internal Medicine

## 2023-11-13 DIAGNOSIS — E1159 Type 2 diabetes mellitus with other circulatory complications: Secondary | ICD-10-CM

## 2023-11-13 MED ORDER — LOSARTAN POTASSIUM 50 MG PO TABS
50.0000 mg | ORAL_TABLET | Freq: Every day | ORAL | 1 refills | Status: DC
Start: 2023-11-13 — End: 2024-02-10

## 2023-11-13 NOTE — Telephone Encounter (Signed)
Noted! Thank you

## 2023-11-20 NOTE — Telephone Encounter (Signed)
[place] form completed and Emailed to [email or web address]

## 2023-11-26 ENCOUNTER — Ambulatory Visit: Payer: 59 | Attending: Internal Medicine | Admitting: Internal Medicine

## 2023-11-26 ENCOUNTER — Other Ambulatory Visit: Payer: Self-pay

## 2023-11-26 ENCOUNTER — Encounter: Payer: Self-pay | Admitting: Internal Medicine

## 2023-11-26 VITALS — BP 126/74 | HR 85 | Wt 187.6 lb

## 2023-11-26 DIAGNOSIS — E785 Hyperlipidemia, unspecified: Secondary | ICD-10-CM | POA: Diagnosis not present

## 2023-11-26 DIAGNOSIS — E119 Type 2 diabetes mellitus without complications: Secondary | ICD-10-CM

## 2023-11-26 DIAGNOSIS — N1832 Chronic kidney disease, stage 3b: Secondary | ICD-10-CM | POA: Diagnosis not present

## 2023-11-26 DIAGNOSIS — E1169 Type 2 diabetes mellitus with other specified complication: Secondary | ICD-10-CM | POA: Diagnosis not present

## 2023-11-26 DIAGNOSIS — I152 Hypertension secondary to endocrine disorders: Secondary | ICD-10-CM

## 2023-11-26 DIAGNOSIS — Z23 Encounter for immunization: Secondary | ICD-10-CM

## 2023-11-26 DIAGNOSIS — Z7984 Long term (current) use of oral hypoglycemic drugs: Secondary | ICD-10-CM | POA: Diagnosis not present

## 2023-11-26 DIAGNOSIS — E66812 Obesity, class 2: Secondary | ICD-10-CM

## 2023-11-26 DIAGNOSIS — E1159 Type 2 diabetes mellitus with other circulatory complications: Secondary | ICD-10-CM

## 2023-11-26 DIAGNOSIS — E669 Obesity, unspecified: Secondary | ICD-10-CM

## 2023-11-26 DIAGNOSIS — Z6835 Body mass index (BMI) 35.0-35.9, adult: Secondary | ICD-10-CM

## 2023-11-26 LAB — GLUCOSE, POCT (MANUAL RESULT ENTRY): POC Glucose: 114 mg/dL — AB (ref 70–99)

## 2023-11-26 LAB — POCT GLYCOSYLATED HEMOGLOBIN (HGB A1C): HbA1c, POC (controlled diabetic range): 7 % (ref 0.0–7.0)

## 2023-11-26 MED ORDER — COVID-19 MRNA VAC-TRIS(PFIZER) 30 MCG/0.3ML IM SUSY
0.3000 mL | PREFILLED_SYRINGE | Freq: Once | INTRAMUSCULAR | 0 refills | Status: AC
  Filled 2023-11-26: qty 0.3, 1d supply, fill #0

## 2023-11-26 NOTE — Progress Notes (Signed)
Patient ID: [name] Barker, female    DOB: 1961/12/13  MRN: [id number]  CC: chronic ds management  Subjective: [name] Barker is a 62 y.o. female who presents for chronic ds management. Her concerns today include:  Patient with history of DM (with macroalbumin, peripheral neuropathy) HL, HTN, perimenopausal (On Effexor), anemia chronic ds, insomnia, CKD stage 3, BL complicated renal cyst, COVID pneumonia   Discussed the use of AI scribe software for clinical note transcription with the patient, who gave verbal consent to proceed.  History of Present Illness   The patient, with a history of diabetes, hypertension, and hyperlipidemia, presents for a routine follow-up. She reports no major health changes since the last visit in July. She has recently obtained new glasses from her ophthalmologist.  DM:  Results for orders placed or performed in visit on 11/26/23  POCT glucose (manual entry)  Result Value Ref Range   POC Glucose 114 (A) 70 - 99 mg/dl  POCT glycosylated hemoglobin (Hb A1C)  Result Value Ref Range   Hemoglobin A1C     HbA1c POC (<> result, manual entry)     HbA1c, POC (prediabetic range)     HbA1c, POC (controlled diabetic range) 7.0 0.0 - 7.0 %  Her A1c has decreased from 7.3 to 7.0 over the past four months. However, she has gained five pounds since the last visit, which she attributes to overeating during the Thanksgiving holiday. She admits to a weakness for sweets, particularly pies. Despite this, she reports going to the gym once a week and walking regularly, including to the store and around the house. She also engages in household chores for additional physical activity. -Compliant with taking metformin 500 mg twice a day, Farxiga 10 mg daily and Amaryl 2 mg daily.  Metformin was decreased on last visit due to declining GFR  HTN:  Compliant with taking Norvasc 10 mg daily and Cozaar 50 mg daily. She does not monitor her blood pressure at home but reports avoiding  salt in her diet, using Mrs. Dash as a substitute. She denies any chest pain or shortness of breath.  HL:  As for her hyperlipidemia, she is due for a cholesterol check. She reports taking her prescribed atorvastatin 30 mg without any issues.  CKD 3b:  She also has a history of kidney disease and was seen by a nephrologist in November. However, she is unsure of the results of her recent kidney function tests.  In terms of health maintenance, she received her flu vaccine during this visit and expresses interest in receiving the COVID-19 booster.      Patient Active Problem List   Diagnosis Date Noted   Claudication of gluteal region [facility]) 03/19/2023   Class 1 obesity due to excess calories with serious comorbidity and body mass index (BMI) of 33.0 to 33.9 in adult 06/01/2021   CKD (chronic kidney disease) stage 3, GFR 30-59 ml/min (HCC) 11/30/2020   Raynaud's phenomenon without gangrene 09/29/2020   Anemia in stage 3a chronic kidney disease (HCC) 09/29/2020   Anemia, chronic disease 02/07/2020   COVID-19 virus infection 09/28/2019   Type II diabetes mellitus with renal manifestations (HCC) 09/28/2019   HTN (hypertension) 09/28/2019   HLD (hyperlipidemia) 09/28/2019   Insomnia due to medical condition 09/13/2016   Perimenopausal 09/13/2016   Hyperlipidemia associated with type 2 diabetes mellitus (HCC) 07/22/2014   Esophageal reflux 07/22/2014   Diabetes (HCC) 04/22/2014   Essential hypertension, benign 04/22/2014     Current Outpatient Medications on File  Prior to Visit  Medication Sig Dispense Refill   Accu-Chek Softclix Lancets lancets Use as instructed 100 each 12   amLODipine (NORVASC) 10 MG tablet TAKE 1 TABLET BY MOUTH DAILY 90 tablet 0   ASPIRIN LOW DOSE 81 MG EC tablet TAKE 1 TABLET (81 MG TOTAL) BY MOUTH DAILY. 90 tablet 6   atorvastatin (LIPITOR) 10 MG tablet TAKE 1 TABLET BY MOUTH DAILY  TAKE ALONG WITH ONE 20 MG TABLET FOR A TOTAL DOSE OF 30 MG DAILY 100 tablet 1    atorvastatin (LIPITOR) 20 MG tablet TAKE 1 TABLET BY MOUTH DAILY 100 tablet 1   Blood Glucose Monitoring Suppl (ACCU-CHEK GUIDE) w/Device KIT UAD 1 kit 0   FARXIGA 10 MG TABS tablet Take 10 mg by mouth daily.     glimepiride (AMARYL) 2 MG tablet Take 1 tablet (2 mg total) by mouth daily before breakfast. 90 tablet 1   glucose blood (ACCU-CHEK GUIDE) test strip Use as instructed 100 each 12   losartan (COZAAR) 50 MG tablet Take 1 tablet (50 mg total) by mouth daily. 90 tablet 1   metFORMIN (GLUCOPHAGE) 500 MG tablet Take 1 tablet (500 mg total) by mouth 2 (two) times daily with a meal. 180 tablet 1   Multiple Vitamin (MULTIVITAMIN WITH MINERALS) TABS tablet Take 1 tablet by mouth daily. 100 tablet 0   Omega-3 Fatty Acids (FISH OIL) 1000 MG CAPS Take 1 capsule (1,000 mg total) by mouth daily. 100 capsule 1   tetrahydrozoline 0.05 % ophthalmic solution Place 2 drops into both eyes daily as needed (for dry eyes).     venlafaxine XR (EFFEXOR-XR) 75 MG 24 hr capsule TAKE 1 CAPSULE BY MOUTH DAILY  WITH BREAKFAST 30 capsule 0   No current facility-administered medications on file prior to visit.    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    Social Determinants of Health   Financial Resource Strain: Low Risk  (02/08/2023)   Overall Financial Resource Strain (CARDIA)    Difficulty of Paying Living Expenses: Not very hard  Food Insecurity: No Food Insecurity (02/08/2023)   Hunger Vital Sign     Worried About Running Out of Food in the Last Year: Never true    Ran Out of Food in the Last Year: Never true  Transportation Needs: No Transportation Needs (02/08/2023)   PRAPARE - Administrator, Civil Service (Medical): No    Lack of Transportation (Non-Medical): No  Physical Activity: Sufficiently Active (02/08/2023)   Exercise Vital Sign    Days of Exercise per Week: 7 days    Minutes of Exercise per Session: 30 min  Stress: No Stress Concern Present (02/08/2023)   [employer] of Occupational Health - Occupational Stress Questionnaire    Feeling of Stress : Not at all  Social Connections: Moderately Isolated (02/08/2023)   Social Connection and  Isolation Panel [NHANES]    Frequency of Communication with Friends and Family: Once a week    Frequency of Social Gatherings with Friends and Family: Once a week    Attends Religious Services: More than 4 times per year    Active Member of Clubs or Organizations: Yes    Attends Engineer, structural: More than 4 times per year    Marital Status: Never married  Catering manager Violence: Not on file    Family History  Problem Relation Age of Onset   Hypertension Mother    Hypertension Father    Heart disease Father    Stroke Other    Diabetes Other    Arthritis Other    Hypertension Sister    Hypertension Brother    Colon cancer Neg Hx     Past Surgical History:  Procedure Laterality Date   CHOLECYSTECTOMY      ROS: Review of Systems Negative except as stated above  PHYSICAL EXAM: BP 129/82 (BP Location: Right Arm, Patient Position: Sitting, Cuff Size: Normal)   Pulse 85   Wt 187 lb 9.6 oz (85.1 kg)   LMP 12/25/2015   SpO2 99%   BMI 35.45 kg/m   Wt Readings from Last 3 Encounters:  11/26/23 187 lb 9.6 oz (85.1 kg)  07/22/23 182 lb (82.6 kg)  03/19/23 178 lb (80.7 kg)    Physical Exam  General appearance - alert, well appearing, older AAF and in no distress Mental status - normal mood,  behavior, speech, dress, motor activity, and thought processes Neck - supple, no significant adenopathy Chest - clear to auscultation, no wheezes, rales or rhonchi, symmetric air entry Heart - normal rate, regular rhythm, normal S1, S2, no murmurs, rubs, clicks or gallops Extremities - peripheral pulses normal, no pedal edema, no clubbing or cyanosis      Latest Ref Rng & Units 03/19/2023   10:09 AM 11/22/2022    9:54 AM 03/15/2022   11:49 AM  CMP  Glucose 70 - 99 mg/dL 914  782  956   BUN 8 - 27 mg/dL 19  20  24    Creatinine 0.57 - 1.00 mg/dL 2.13  0.86  5.78   Sodium 134 - 144 mmol/L 141  141  140   Potassium 3.5 - 5.2 mmol/L 4.2  4.4  4.3   Chloride 96 - 106 mmol/L 103  102  101   CO2 20 - 29 mmol/L 21  23  23    Calcium 8.7 - 10.3 mg/dL 9.3  9.4  9.7   Total Protein 6.0 - 8.5 g/dL  7.1    Total Bilirubin 0.0 - 1.2 mg/dL  0.7    Alkaline Phos 44 - 121 IU/L  81    AST 0 - 40 IU/L  20    ALT 0 - 32 IU/L  20     Lipid Panel     Component Value Date/Time   CHOL 174 11/22/2022 0954   TRIG 124 11/22/2022 0954   HDL 54 11/22/2022 0954   CHOLHDL 3.2 11/22/2022 0954   CHOLHDL 2.9 09/13/2016 0949   VLDL 23 09/13/2016 0949   LDLCALC 98 11/22/2022 0954    CBC    Component Value Date/Time   WBC 6.6 11/22/2022 0954   WBC 12.2 (H) 10/02/2019 0335   RBC 4.03 11/22/2022 0954   RBC 3.65 (L) 10/02/2019 0335   HGB 11.5 11/22/2022 0954   HCT 34.9 11/22/2022 0954   PLT 292 11/22/2022 0954   MCV  87 11/22/2022 0954   MCH 28.5 11/22/2022 0954   MCH 28.2 10/02/2019 0335   MCHC 33.0 11/22/2022 0954   MCHC 32.8 10/02/2019 0335   RDW 13.8 11/22/2022 0954   LYMPHSABS 2.8 11/04/2019 1001   MONOABS 1.0 10/02/2019 0335   EOSABS 0.5 (H) 11/04/2019 1001   BASOSABS 0.1 11/04/2019 1001    ASSESSMENT AND PLAN: 1. Type 2 diabetes mellitus with obesity (HCC) Close to goal. Continue metformin 500 mg twice a day, Amaryl 2 mg daily and Farxiga 10 mg daily.  She declined trying GLP1 agent on last  visit. Discussed and encourage healthy eating habits including healthier snacks.  Challenged her to increase her days at the gym to 2 times a week. - POCT glucose (manual entry) - POCT glycosylated hemoglobin (Hb A1C) - CBC - Comprehensive metabolic panel  2. Diabetes mellitus treated with oral medication (HCC) See #1 above  3. Hypertension associated with diabetes (HCC) At goal.  Continue Norvasc 10 mg daily and Cozaar 50 mg daily.  4. Hyperlipidemia associated with type 2 diabetes mellitus (HCC) Continue atorvastatin 30 mg daily. - Lipid panel  5. Stage 3b chronic kidney disease (HCC) Recheck chemistry today.  Await note from her nephrologist.  6. Need for immunization against influenza - Flu vaccine trivalent PF, 6mos and older(Flulaval,Afluria,Fluarix,Fluzone) Patient to get COVID booster from any outside pharmacy.  Addendum 11/27/2023: Dose of Lipitor corrected in note above.  Patient is on 30 mg daily not 20 mg daily. Patient was given the opportunity to ask questions.  Patient verbalized understanding of the plan and was able to repeat key elements of the plan.   This documentation was completed using Paediatric nurse.  Any transcriptional errors are unintentional.  Orders Placed This Encounter  Procedures   Flu vaccine trivalent PF, 6mos and older(Flulaval,Afluria,Fluarix,Fluzone)   POCT glucose (manual entry)   POCT glycosylated hemoglobin (Hb A1C)     Requested Prescriptions    No prescriptions requested or ordered in this encounter    No follow-ups on file.  Jonah Blue, MD, FACP

## 2023-11-27 ENCOUNTER — Other Ambulatory Visit: Payer: Self-pay | Admitting: Internal Medicine

## 2023-11-27 DIAGNOSIS — E669 Obesity, unspecified: Secondary | ICD-10-CM

## 2023-11-27 DIAGNOSIS — E785 Hyperlipidemia, unspecified: Secondary | ICD-10-CM

## 2023-11-27 LAB — COMPREHENSIVE METABOLIC PANEL
ALT: 28 [IU]/L (ref 0–32)
AST: 24 [IU]/L (ref 0–40)
Albumin: 4.3 g/dL (ref 3.9–4.9)
Alkaline Phosphatase: 89 [IU]/L (ref 44–121)
BUN/Creatinine Ratio: 13 (ref 12–28)
BUN: 21 mg/dL (ref 8–27)
Bilirubin Total: 0.7 mg/dL (ref 0.0–1.2)
CO2: 21 mmol/L (ref 20–29)
Calcium: 9.7 mg/dL (ref 8.7–10.3)
Chloride: 105 mmol/L (ref 96–106)
Creatinine, Ser: 1.61 mg/dL — ABNORMAL HIGH (ref 0.57–1.00)
Globulin, Total: 2.4 g/dL (ref 1.5–4.5)
Glucose: 86 mg/dL (ref 70–99)
Potassium: 4.5 mmol/L (ref 3.5–5.2)
Sodium: 143 mmol/L (ref 134–144)
Total Protein: 6.7 g/dL (ref 6.0–8.5)
eGFR: 36 mL/min/{1.73_m2} — ABNORMAL LOW (ref >59)

## 2023-11-27 LAB — CBC
Hematocrit: 36.1 % (ref 34.0–46.6)
Hemoglobin: 12 g/dL (ref 11.1–15.9)
MCH: 29.1 pg (ref 26.6–33.0)
MCHC: 33.2 g/dL (ref 31.5–35.7)
MCV: 88 fL (ref 79–97)
Platelets: 305 10*3/uL (ref 150–450)
RBC: 4.12 x10E6/uL (ref 3.77–5.28)
RDW: 13.2 % (ref 11.7–15.4)
WBC: 6.5 10*3/uL (ref 3.4–10.8)

## 2023-11-27 LAB — LIPID PANEL
Chol/HDL Ratio: 4 {ratio} (ref 0.0–4.4)
Cholesterol, Total: 183 mg/dL (ref 100–199)
HDL: 46 mg/dL (ref >39)
LDL Chol Calc (NIH): 112 mg/dL — ABNORMAL HIGH (ref 0–99)
Triglycerides: 138 mg/dL (ref 0–149)
VLDL Cholesterol Cal: 25 mg/dL (ref 5–40)

## 2023-11-27 MED ORDER — GLIMEPIRIDE 2 MG PO TABS: 3.0000 mg | ORAL_TABLET | Freq: Every day | ORAL | 1 refills | Status: DC

## 2023-11-27 MED ORDER — METFORMIN HCL 500 MG PO TABS: 500.0000 mg | ORAL_TABLET | Freq: Every day | ORAL | 1 refills | Status: DC

## 2023-11-27 MED ORDER — ATORVASTATIN CALCIUM 40 MG PO TABS: 40.0000 mg | ORAL_TABLET | Freq: Every day | ORAL | 1 refills | Status: DC

## 2023-11-27 NOTE — Progress Notes (Signed)
Let patient know that kidney function is not at 100% but stable.  Make sure that she is drinking several glasses of water daily. I recommend decreasing metformin to 500 mg once a day.  Increase the glimepiride to 2 mg 1.5 tablets daily instead. Blood cell count and liver function test normal.

## 2023-11-29 ENCOUNTER — Other Ambulatory Visit: Payer: Self-pay | Admitting: Internal Medicine

## 2023-11-29 DIAGNOSIS — N951 Menopausal and female climacteric states: Secondary | ICD-10-CM

## 2023-12-02 NOTE — Telephone Encounter (Signed)
Requested Prescriptions  Pending Prescriptions Disp Refills   venlafaxine XR (EFFEXOR-XR) 75 MG 24 hr capsule [Pharmacy Med Name: Venlafaxine HCl ER 75 MG Oral Capsule Extended Release 24 Hour] 90 capsule 1    Sig: TAKE 1 CAPSULE BY MOUTH DAILY  WITH BREAKFAST     Psychiatry: Antidepressants - SNRI - desvenlafaxine & venlafaxine Failed - 11/29/2023 10:08 PM      Failed - Cr in normal range and within 360 days    Creat  Date Value Ref Range Status  12/27/2015 0.94 0.50 - 1.05 mg/dL Final   Creatinine, Ser  Date Value Ref Range Status  11/26/2023 1.61 (H) 0.57 - 1.00 mg/dL Final   Creatinine, POC  Date Value Ref Range Status  05/13/2023 66.5 mg/dL Final    Comment:    abstracted by HIM   Creatinine, Urine  Date Value Ref Range Status  09/28/2019 87.86 mg/dL Final    Comment:    Performed at [facility], [address] [place], [place] [address]         Failed - Lipid Panel in normal range within the last 12 months    Cholesterol, Total  Date Value Ref Range Status  11/26/2023 183 100 - 199 mg/dL Final   LDL Chol Calc (NIH)  Date Value Ref Range Status  11/26/2023 112 (H) 0 - 99 mg/dL Final   HDL  Date Value Ref Range Status  11/26/2023 46 >39 mg/dL Final   Triglycerides  Date Value Ref Range Status  11/26/2023 138 0 - 149 mg/dL Final         Passed - Last BP in normal range    BP Readings from Last 1 Encounters:  11/26/23 126/74         Passed - Valid encounter within last 6 months    Recent Outpatient Visits           6 days ago Type 2 diabetes mellitus with obesity (HCC)   [place] [facility] - A Dept Of [place]. [facility] Jonah Blue B, MD   4 months ago Type 2 diabetes mellitus with obesity [facility])   [place] [facility] - A Dept Of [place]. [facility] Jonah Blue B, MD   8 months ago Type 2 diabetes mellitus with obesity [facility])   [place] [facility] - A Dept Of [place]  [facility]. [facility] Marcine Matar, MD   1 year ago Type 2 diabetes mellitus with obesity [facility])   [place] [facility] - A Dept Of [place]. [facility] Marcine Matar, MD   1 year ago Type 2 diabetes mellitus with obesity [facility])   [place] [facility] - A Dept Of [place]. [facility] Marcine Matar, MD       Future Appointments             In 3 months Laural Benes Binnie Rail, MD [facility] - A Dept Of Eligha Bridegroom. [facility]

## 2024-01-18 ENCOUNTER — Other Ambulatory Visit: Payer: Self-pay | Admitting: Internal Medicine

## 2024-01-18 DIAGNOSIS — I152 Hypertension secondary to endocrine disorders: Secondary | ICD-10-CM

## 2024-01-20 NOTE — Telephone Encounter (Signed)
Requested by interface surescripts. Future visit in 2 months.  Requested Prescriptions  Pending Prescriptions Disp Refills   amLODipine (NORVASC) 10 MG tablet [Pharmacy Med Name: amLODIPine Besylate 10 MG Oral Tablet] 90 tablet 0    Sig: TAKE 1 TABLET BY MOUTH DAILY     Cardiovascular: Calcium Channel Blockers 2 Passed - 01/20/2024  2:54 PM      Passed - Last BP in normal range    BP Readings from Last 1 Encounters:  11/26/23 126/74         Passed - Last Heart Rate in normal range    Pulse Readings from Last 1 Encounters:  11/26/23 85         Passed - Valid encounter within last 6 months    Recent Outpatient Visits           1 month ago Type 2 diabetes mellitus with obesity (HCC)   [place] [facility] - [facility] Dept Of [place]. [facility] Jonah Blue B, MD   6 months ago Type 2 diabetes mellitus with obesity [facility])   [place] [facility] - A Dept Of [place]. [facility] Jonah Blue B, MD   10 months ago Type 2 diabetes mellitus with obesity [facility])   [place] [facility] - A Dept Of [place]. [facility] Marcine Matar, MD   1 year ago Type 2 diabetes mellitus with obesity [facility])   [place] [facility] - A Dept Of [place]. [facility] Marcine Matar, MD   1 year ago Type 2 diabetes mellitus with obesity [facility])   [place] [facility] - A Dept Of [place]. [facility] Marcine Matar, MD       Future Appointments             In 2 months Laural Benes Binnie Rail, MD [facility] [place] - A Dept Of [place] Bridegroom. [facility]

## 2024-01-31 ENCOUNTER — Other Ambulatory Visit: Payer: Self-pay | Admitting: Internal Medicine

## 2024-01-31 DIAGNOSIS — E1169 Type 2 diabetes mellitus with other specified complication: Secondary | ICD-10-CM

## 2024-02-03 NOTE — Telephone Encounter (Signed)
 1 year supply not appropriate at this time. Requested Prescriptions  Pending Prescriptions Disp Refills   atorvastatin  (LIPITOR) 40 MG tablet [Pharmacy Med Name: Atorvastatin  Calcium  40 MG Oral Tablet] 100 tablet 2    Sig: TAKE 1 TABLET BY MOUTH DAILY     Cardiovascular:  Antilipid - Statins Failed - 02/03/2024  9:47 AM      Failed - Lipid Panel in normal range within the last 12 months    Cholesterol, Total  Date Value Ref Range Status  11/26/2023 183 100 - 199 mg/dL Final   LDL Chol Calc (NIH)  Date Value Ref Range Status  11/26/2023 112 (H) 0 - 99 mg/dL Final   HDL  Date Value Ref Range Status  11/26/2023 46 >39 mg/dL Final   Triglycerides  Date Value Ref Range Status  11/26/2023 138 0 - 149 mg/dL Final         Passed - Patient is not pregnant      Passed - Valid encounter within last 12 months    Recent Outpatient Visits           2 months ago Type 2 diabetes mellitus with obesity (HCC)   [place] [facility] - [facility] Dept Of [place]. [facility] Concetta Dee B, MD   6 months ago Type 2 diabetes mellitus with obesity [facility])   [place] [facility] - A Dept Of [place]. [facility] Con[facility] Dee B, MD   10 months ago Type 2 diabetes mellitus with obesity [facility])   [place] [facility] - A Dept Of [place]. [facility] Lawrance Presume, MD   1 year ago Type 2 diabetes mellitus with obesity [facility])   [place] [facility] - A Dept Of [place]. [facility] Lawrance Presume, MD   1 year ago Type 2 diabetes mellitus with obesity [facility])   [place] [facility] - A Dept Of . [facility] Lawrance Presume, MD       Future Appointments             In 1 month Lincoln Renshaw Rexine Cater, MD [facility] [place] - A Dept Of [place]ag[place]. [facility]

## 2024-02-09 ENCOUNTER — Other Ambulatory Visit: Payer: Self-pay | Admitting: Internal Medicine

## 2024-02-09 DIAGNOSIS — E1159 Type 2 diabetes mellitus with other circulatory complications: Secondary | ICD-10-CM

## 2024-02-15 ENCOUNTER — Other Ambulatory Visit: Payer: Self-pay | Admitting: Internal Medicine

## 2024-02-15 DIAGNOSIS — E1169 Type 2 diabetes mellitus with other specified complication: Secondary | ICD-10-CM

## 2024-02-24 ENCOUNTER — Other Ambulatory Visit: Payer: Self-pay | Admitting: Internal Medicine

## 2024-02-24 DIAGNOSIS — N281 Cyst of kidney, acquired: Secondary | ICD-10-CM | POA: Diagnosis not present

## 2024-02-24 DIAGNOSIS — I129 Hypertensive chronic kidney disease with stage 1 through stage 4 chronic kidney disease, or unspecified chronic kidney disease: Secondary | ICD-10-CM | POA: Diagnosis not present

## 2024-02-24 DIAGNOSIS — E1122 Type 2 diabetes mellitus with diabetic chronic kidney disease: Secondary | ICD-10-CM | POA: Diagnosis not present

## 2024-02-24 DIAGNOSIS — N183 Chronic kidney disease, stage 3 unspecified: Secondary | ICD-10-CM | POA: Diagnosis not present

## 2024-02-24 DIAGNOSIS — R809 Proteinuria, unspecified: Secondary | ICD-10-CM | POA: Diagnosis not present

## 2024-02-24 DIAGNOSIS — E1169 Type 2 diabetes mellitus with other specified complication: Secondary | ICD-10-CM

## 2024-02-26 LAB — LAB REPORT - SCANNED
A1c: 7
Creatinine, POC: 43.9 mg/dL
EGFR: 43

## 2024-02-28 ENCOUNTER — Other Ambulatory Visit: Payer: Self-pay | Admitting: Internal Medicine

## 2024-02-28 DIAGNOSIS — E1169 Type 2 diabetes mellitus with other specified complication: Secondary | ICD-10-CM

## 2024-03-04 DIAGNOSIS — H40013 Open angle with borderline findings, low risk, bilateral: Secondary | ICD-10-CM | POA: Diagnosis not present

## 2024-03-21 ENCOUNTER — Other Ambulatory Visit: Payer: Self-pay | Admitting: Internal Medicine

## 2024-03-21 DIAGNOSIS — E1159 Type 2 diabetes mellitus with other circulatory complications: Secondary | ICD-10-CM

## 2024-03-27 ENCOUNTER — Ambulatory Visit: Payer: 59 | Admitting: Internal Medicine

## 2024-04-06 ENCOUNTER — Ambulatory Visit: Payer: 59 | Attending: Internal Medicine | Admitting: Internal Medicine

## 2024-04-06 VITALS — BP 125/62 | HR 87 | Temp 98.0°F | Ht 61.0 in | Wt 181.0 lb

## 2024-04-06 DIAGNOSIS — N1832 Chronic kidney disease, stage 3b: Secondary | ICD-10-CM | POA: Diagnosis not present

## 2024-04-06 DIAGNOSIS — E119 Type 2 diabetes mellitus without complications: Secondary | ICD-10-CM | POA: Diagnosis not present

## 2024-04-06 DIAGNOSIS — E1169 Type 2 diabetes mellitus with other specified complication: Secondary | ICD-10-CM

## 2024-04-06 DIAGNOSIS — N951 Menopausal and female climacteric states: Secondary | ICD-10-CM | POA: Diagnosis not present

## 2024-04-06 DIAGNOSIS — I152 Hypertension secondary to endocrine disorders: Secondary | ICD-10-CM

## 2024-04-06 DIAGNOSIS — Z7984 Long term (current) use of oral hypoglycemic drugs: Secondary | ICD-10-CM | POA: Diagnosis not present

## 2024-04-06 DIAGNOSIS — I129 Hypertensive chronic kidney disease with stage 1 through stage 4 chronic kidney disease, or unspecified chronic kidney disease: Secondary | ICD-10-CM

## 2024-04-06 DIAGNOSIS — R2232 Localized swelling, mass and lump, left upper limb: Secondary | ICD-10-CM | POA: Diagnosis not present

## 2024-04-06 DIAGNOSIS — E1159 Type 2 diabetes mellitus with other circulatory complications: Secondary | ICD-10-CM

## 2024-04-06 DIAGNOSIS — E785 Hyperlipidemia, unspecified: Secondary | ICD-10-CM

## 2024-04-06 DIAGNOSIS — E669 Obesity, unspecified: Secondary | ICD-10-CM

## 2024-04-06 DIAGNOSIS — Z6834 Body mass index (BMI) 34.0-34.9, adult: Secondary | ICD-10-CM

## 2024-04-06 MED ORDER — VENLAFAXINE HCL ER 75 MG PO CP24: 75.0000 mg | ORAL_CAPSULE | Freq: Every day | ORAL | 1 refills | Status: DC

## 2024-04-06 MED ORDER — METFORMIN HCL 500 MG PO TABS: 500.0000 mg | ORAL_TABLET | Freq: Every day | ORAL | 1 refills | Status: DC

## 2024-04-06 MED ORDER — AMLODIPINE BESYLATE 10 MG PO TABS: 10.0000 mg | ORAL_TABLET | Freq: Every day | ORAL | 2 refills | Status: DC

## 2024-04-06 NOTE — Progress Notes (Unsigned)
 Patient ID: [name] Barker, female    DOB: [birth year]  MRN: [id number]  CC: Hypertension (HTN f/u./Mass on L forearm & growing X2 mo /Increasing numbness & tingling on feet)   Subjective: [name] Barker is a 63 y.o. female who presents for chronic ds management. Her concerns today include:  Patient with history of DM (with macroalbumin, peripheral neuropathy) HL, HTN, perimenopausal (On Effexor), anemia chronic ds, insomnia, CKD stage 3, BL complicated renal cyst, COVID pneumonia   Discussed the use of AI scribe software for clinical note transcription with the patient, who gave verbal consent to proceed.  History of Present Illness   The patient, with a history of diabetes, hypertension, and hyperlipidemia, presents for a four month follow-up visit. Mass LT forearm:  She reports a new mass on her left forearm below the elbow anteriorly that has been present for two months. The mass is not painful, but she describes a 'drawing' sensation that radiates to her thumb. She also reports that her hand feels as if it is 'drawing' at times. She is unsure if the mass is related to her weightlifting at the gym or a previous injury to her hand.  DM: In regards to her diabetes, her most recent A1c was 7 done 02/26/24 by her nephrologist, which is the same as it was in December. She is taking glimepiride 3 mg daily, Farxiga 10 mg daily and metformin 500 mg daily consistently.She also reports that she has been going to the gym twice a week and using various machines, including the treadmill and a glider. Despite her efforts, she has not been able to lose weight.  Overall doing well with her eating habits except she still likes to drink sodas albeit diet soda.  HTN/HL: She is also taking amlodipine 10 mg and Cozaar 50 mg daily  for her blood pressure and atorvastatin for her cholesterol. She reports limiting her salt intake and is trying to improve her eating habits, although she does drink diet soda once a  day.      CKD 3: Recent labs from nephrology done on the fifth of last month revealed GFR of 43.  Her range has been 43-36 over the past 1-1/2 years.  Hx of macroalbumin; will give urine today to be checked. On Farxiga 10 mg daily.  Patient Active Problem List   Diagnosis Date Noted   Claudication of gluteal region [facility]) 03/19/2023   Class 1 obesity due to excess calories with serious comorbidity and body mass index (BMI) of 33.0 to 33.9 in adult 06/01/2021   Stage 3b chronic kidney disease (HCC) 11/30/2020   Raynaud's phenomenon without gangrene 09/29/2020   Anemia in stage 3a chronic kidney disease (HCC) 09/29/2020   Anemia, chronic disease 02/07/2020   COVID-19 virus infection 09/28/2019   Type II diabetes mellitus with renal manifestations (HCC) 09/28/2019   HTN (hypertension) 09/28/2019   HLD (hyperlipidemia) 09/28/2019   Insomnia due to medical condition 09/13/2016   Perimenopausal 09/13/2016   Hyperlipidemia associated with type 2 diabetes mellitus (HCC) 07/22/2014   Esophageal reflux 07/22/2014   Diabetes (HCC) 04/22/2014   Essential hypertension, benign 04/22/2014     Current Outpatient Medications on File Prior to Visit  Medication Sig Dispense Refill   Accu-Chek Softclix Lancets lancets Use as instructed 100 each 12   ASPIRIN LOW DOSE 81 MG EC tablet TAKE 1 TABLET (81 MG TOTAL) BY MOUTH DAILY. 90 tablet 6   atorvastatin (LIPITOR) 40 MG tablet TAKE 1 TABLET BY MOUTH DAILY 100  tablet 2   Blood Glucose Monitoring Suppl (ACCU-CHEK GUIDE) w/Device KIT UAD 1 kit 0   FARXIGA 10 MG TABS tablet Take 10 mg by mouth daily.     glimepiride (AMARYL) 2 MG tablet TAKE 1 AND 1/2 TABLETS BY MOUTH  DAILY BEFORE BREAKFAST 150 tablet 2   glucose blood (ACCU-CHEK GUIDE) test strip Use as instructed 100 each 12   losartan (COZAAR) 50 MG tablet TAKE 1 TABLET BY MOUTH DAILY 100 tablet 2   Multiple Vitamin (MULTIVITAMIN WITH MINERALS) TABS tablet Take 1 tablet by mouth daily. 100 tablet 0    Omega-3 Fatty Acids (FISH OIL) 1000 MG CAPS Take 1 capsule (1,000 mg total) by mouth daily. 100 capsule 1   tetrahydrozoline 0.05 % ophthalmic solution Place 2 drops into both eyes daily as needed (for dry eyes).     No current facility-administered medications on file prior to visit.    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    Social Drivers of Corporate investment banker Strain: Low Risk  (04/06/2024)   Overall Financial Resource Strain (CARDIA)    Difficulty of Paying Living Expenses: Not very hard  Food Insecurity: No Food Insecurity (04/06/2024)   Hunger Vital Sign    Worried About Running Out of Food in the Last Year: Never true    Ran Out of Food in the Last Year: Never true  Transportation Needs: No Transportation Needs (04/06/2024)   PRAPARE - Administrator, Civil Service (Medical): No    Lack of Transportation (Non-Medical): No  Physical Activity: Inactive (04/06/2024)   Exercise Vital Sign    Days of Exercise per Week: 0 days    Minutes of Exercise per Session: 0 min  Stress: No Stress Concern Present (04/06/2024)   [employer] of Occupational Health - Occupational Stress Questionnaire    Feeling of Stress : Not at all  Social Connections: Moderately Integrated (04/06/2024)   Social Connection and Isolation Panel [NHANES]    Frequency of Communication with Friends and Family: Twice a week    Frequency of Social Gatherings with Friends and Family: More  than three times a week    Attends Religious Services: More than 4 times per year    Active Member of [employer] or Organizations: Yes    Attends Banker Meetings: More than 4 times per year    Marital Status: Never married  Intimate Partner Violence: Not At Risk (04/06/2024)   Humiliation, Afraid, Rape, and Kick questionnaire    Fear of Current or Ex-Partner: No    Emotionally Abused: No    Physically Abused: No    Sexually Abused: No    Family History  Problem Relation Age of Onset   Hypertension Mother    Hypertension Father    Heart disease Father    Stroke Other    Diabetes  Other    Arthritis Other    Hypertension Sister    Hypertension Brother    Colon cancer Neg Hx     Past Surgical History:  Procedure Laterality Date   CHOLECYSTECTOMY      ROS: Review of Systems Negative except as stated above  PHYSICAL EXAM: BP 125/62 (BP Location: Left Arm, Patient Position: Sitting, Cuff Size: Normal)   Pulse 87   Temp 98 F (36.7 C) (Oral)   Ht 5\' 1"  (1.549 m)   Wt 181 lb (82.1 kg)   LMP 12/25/2015   SpO2 98%   BMI 34.20 kg/m   Physical Exam  General appearance - alert, well appearing, older AAF and in no distress Mental status - normal mood, behavior, speech, dress, motor activity, and thought processes Neck - supple, no significant adenopathy Chest - clear to auscultation, no wheezes, rales or rhonchi, symmetric air entry Heart - normal rate, regular rhythm, normal S1, S2, no murmurs, rubs, clicks or gallops Extremities - peripheral pulses normal, no pedal edema, no clubbing or cyanosis MSK: She has a 5 x 5 soft movable raised mass on the left forearm anteriorly and laterally just below the cubital fossa.  It is nontender to touch.  Feels as though the mass is in the muscle.      Latest Ref Rng & Units 11/26/2023   11:14 AM 03/19/2023   10:09 AM 11/22/2022    9:54 AM  CMP  Glucose 70 - 99 mg/dL 86  161  096   BUN 8 - 27 mg/dL 21  19  20    Creatinine  0.57 - 1.00 mg/dL 0.45  4.09  8.11   Sodium 134 - 144 mmol/L 143  141  141   Potassium 3.5 - 5.2 mmol/L 4.5  4.2  4.4   Chloride 96 - 106 mmol/L 105  103  102   CO2 20 - 29 mmol/L 21  21  23    Calcium 8.7 - 10.3 mg/dL 9.7  9.3  9.4   Total Protein 6.0 - 8.5 g/dL 6.7   7.1   Total Bilirubin 0.0 - 1.2 mg/dL 0.7   0.7   Alkaline Phos 44 - 121 IU/L 89   81   AST 0 - 40 IU/L 24   20   ALT 0 - 32 IU/L 28   20    Lipid Panel     Component Value Date/Time   CHOL 183 11/26/2023 1114   TRIG 138 11/26/2023 1114   HDL 46 11/26/2023 1114   CHOLHDL 4.0 11/26/2023 1114   CHOLHDL 2.9 09/13/2016 0949   VLDL 23 09/13/2016 0949   LDLCALC 112 (H) 11/26/2023 1114    CBC    Component Value Date/Time   WBC 6.5 11/26/2023 1114   WBC 12.2 (H) 10/02/2019 0335   RBC 4.12 11/26/2023 1114   RBC 3.65 (L) 10/02/2019 0335   HGB 12.0 11/26/2023 1114   HCT 36.1 11/26/2023 1114   PLT 305 11/26/2023 1114   MCV 88 11/26/2023 1114   MCH 29.1 11/26/2023 1114   MCH 28.2 10/02/2019 0335   MCHC 33.2 11/26/2023 1114   MCHC 32.8 10/02/2019 0335   RDW 13.2 11/26/2023 1114   LYMPHSABS 2.8 11/04/2019 1001   MONOABS 1.0 10/02/2019 0335   EOSABS 0.5 (H) 11/04/2019 1001   BASOSABS 0.1 11/04/2019 1001    ASSESSMENT AND PLAN: 1. Type 2 diabetes mellitus with obesity (HCC) (Primary) Close to goal.  She will continue Amaryl 3 mg daily, metformin 500  mg daily and Farxiga 10 mg daily.  Advised to eliminate sodas from the diet completely.  Encourage other healthy eating habits.  Continue regular exercise. - metFORMIN (GLUCOPHAGE) 500 MG tablet; Take 1 tablet (500 mg total) by mouth daily with breakfast.  Dispense: 90 tablet; Refill: 1 - Microalbumin / creatinine urine ratio  2. Diabetes mellitus treated with oral medication (HCC) See #1 above.   3. Hypertension associated with diabetes (HCC) At goal.  Continue amlodipine and Cozaar - amLODipine (NORVASC) 10 MG tablet; Take 1 tablet (10 mg total) by mouth daily.   Dispense: 100 tablet; Refill: 2  4. Stage 3b chronic kidney disease (HCC) Stable. Followed by nephrology.  On Farxiga  5. Mass of left forearm Will need imaging. I did consult with the radiologist as to whether ultrasound versus MRI would be most definitive since it feels as though this mass is in the muscle itself.  We decided to go with MRI with and without contrast.  6. Hyperlipidemia associated with type 2 diabetes mellitus (HCC) Continue atorvastatin 40 mg daily.  7. Perimenopausal Patient requested refill on Effexor. - venlafaxine XR (EFFEXOR-XR) 75 MG 24 hr capsule; Take 1 capsule (75 mg total) by mouth daily with breakfast.  Dispense: 90 capsule; Refill: 1    Patient was given the opportunity to ask questions.  Patient verbalized understanding of the plan and was able to repeat key elements of the plan.   This documentation was completed using Paediatric nurse.  Any transcriptional errors are unintentional.  Orders Placed This Encounter  Procedures   Microalbumin / creatinine urine ratio     Requested Prescriptions   Signed Prescriptions Disp Refills   amLODipine (NORVASC) 10 MG tablet 100 tablet 2    Sig: Take 1 tablet (10 mg total) by mouth daily.   metFORMIN (GLUCOPHAGE) 500 MG tablet 90 tablet 1    Sig: Take 1 tablet (500 mg total) by mouth daily with breakfast.   venlafaxine XR (EFFEXOR-XR) 75 MG 24 hr capsule 90 capsule 1    Sig: Take 1 capsule (75 mg total) by mouth daily with breakfast.    Return in about 4 months (around 08/06/2024).  Concetta Dee, MD, FACP

## 2024-04-08 ENCOUNTER — Encounter: Payer: Self-pay | Admitting: Internal Medicine

## 2024-04-08 LAB — MICROALBUMIN / CREATININE URINE RATIO
Creatinine, Urine: 58.5 mg/dL
Microalb/Creat Ratio: 646 mg/g{creat} — ABNORMAL HIGH (ref 0–29)
Microalbumin, Urine: 377.8 ug/mL

## 2024-04-14 ENCOUNTER — Ambulatory Visit: Attending: Internal Medicine

## 2024-04-14 VITALS — Ht 61.0 in | Wt 181.0 lb

## 2024-04-14 DIAGNOSIS — Z0000 Encounter for general adult medical examination without abnormal findings: Secondary | ICD-10-CM | POA: Diagnosis not present

## 2024-04-14 DIAGNOSIS — Z1211 Encounter for screening for malignant neoplasm of colon: Secondary | ICD-10-CM

## 2024-04-14 NOTE — Patient Instructions (Addendum)
 [name] Barker , Thank you for taking time to come for your Medicare Wellness Visit. I appreciate your ongoing commitment to your health goals. Please review the following plan we discussed and let me know if I can assist you in the future.   Referrals/Orders/Follow-Ups/Clinician Recommendations: Keep maintaining your health by keeping your appointments with Dr. Lincoln Renshaw and any specialists that you may see.  Call us  if you need anything.  Have a great year!!!!  This is a list of the screening recommended for you and due dates:  Health Maintenance  Topic Date Due   Medicare Annual Wellness Visit  02/11/2024   Colon Cancer Screening  03/23/2024   Complete foot exam   03/18/2024   Flu Shot  07/24/2024   Hemoglobin A1C  08/28/2024   Eye exam for diabetics  09/02/2024   Yearly kidney function blood test for diabetes  02/25/2025   Yearly kidney health urinalysis for diabetes  04/06/2025   Mammogram  07/16/2025   Pap with HPV screening  11/14/2026   DTaP/Tdap/Td vaccine (2 - Td or Tdap) 12/20/2026   Pneumococcal Vaccination  Completed   COVID-19 Vaccine  Completed   Hepatitis C Screening  Completed   HIV Screening  Completed   Zoster (Shingles) Vaccine  Completed   HPV Vaccine  Aged Out   Meningitis B Vaccine  Aged Out    Advanced directives: (Copy Requested) Please bring a copy of your health care power of attorney and living will to the office to be added to your chart at your convenience. You can mail to [facility] [address] [place], [place] [address] or email to [email or web address]  Next Medicare Annual Wellness Visit scheduled for next year: Yes, It was nice speaking with you today! Your next Annual Wellness Visit is scheduled for 04/20/2025 at 4:50 p.m. via PHONE VISIT. If you need to reschedule or cancel, please call [phone].

## 2024-04-14 NOTE — Progress Notes (Signed)
 Because this visit was a virtual/telehealth visit,  certain criteria was not obtained, such a blood pressure, CBG if applicable, and timed get up and go. Any medications not marked as "taking" were not mentioned during the medication reconciliation part of the visit. Any vitals not documented were not able to be obtained due to this being a telehealth visit or patient was unable to self-report a recent blood pressure reading due to a lack of equipment at home via telehealth. Vitals that have been documented are verbally provided by the patient.   Subjective:   [name] Barker is a 63 y.o. who presents for a Medicare Wellness preventive visit.  Visit Complete: Virtual I connected with  Eduardo Grade on 04/14/24 by a audio enabled telemedicine application and verified that I am speaking with the correct person using two identifiers.  Patient Location: Home  Provider Location: Office/Clinic  I discussed the limitations of evaluation and management by telemedicine. The patient expressed understanding and agreed to proceed.  Vital Signs: Because this visit was a virtual/telehealth visit, some criteria may be missing or patient reported. Any vitals not documented were not able to be obtained and vitals that have been documented are patient reported.  VideoDeclined- This patient declined Librarian, academic. Therefore the visit was completed with audio only.  Persons Participating in Visit: Patient.  AWV Questionnaire: No: Patient Medicare AWV questionnaire was not completed prior to this visit.  Cardiac Risk Factors include: advanced age (>68men, >36 women);diabetes mellitus;dyslipidemia;family history of premature cardiovascular disease;hypertension;obesity (BMI >30kg/m2)     Objective:    Today's Vitals   04/14/24 1652  Weight: 181 lb (82.1 kg)  Height: 5\' 1"  (1.549 m)  PainSc: 0-No pain   Body mass index is 34.2 kg/m.     04/14/2024    4:55 PM 02/08/2023     9:57 AM 06/28/2021    3:19 PM 06/23/2020    8:46 AM 09/28/2019    3:00 AM 09/27/2019    4:59 PM 10/17/2017   10:42 AM  Advanced Directives  Does Patient Have a Medical Advance Directive? Yes Yes No No No No No  Type of Estate agent of [place];Living will        Copy of Healthcare Power of Attorney in Chart? No - copy requested        Would patient like information on creating a medical advance directive?   No - Patient declined Yes (Inpatient - patient defers creating a medical advance directive at this time - Information given) No - Patient declined Yes (ED - Information included in AVS)     Current Medications (verified) Outpatient Encounter Medications as of 04/14/2024  Medication Sig   Accu-Chek Softclix Lancets lancets Use as instructed   amLODipine  (NORVASC ) 10 MG tablet Take 1 tablet (10 mg total) by mouth daily.   ASPIRIN  LOW DOSE 81 MG EC tablet TAKE 1 TABLET (81 MG TOTAL) BY MOUTH DAILY.   atorvastatin  (LIPITOR) 40 MG tablet TAKE 1 TABLET BY MOUTH DAILY   Blood Glucose Monitoring Suppl (ACCU-CHEK GUIDE) w/Device KIT UAD   FARXIGA 10 MG TABS tablet Take 10 mg by mouth daily.   glimepiride  (AMARYL ) 2 MG tablet TAKE 1 AND 1/2 TABLETS BY MOUTH  DAILY BEFORE BREAKFAST   glucose blood (ACCU-CHEK GUIDE) test strip Use as instructed   losartan  (COZAAR ) 50 MG tablet TAKE 1 TABLET BY MOUTH DAILY   metFORMIN  (GLUCOPHAGE ) 500 MG tablet Take 1 tablet (500 mg total) by mouth daily with  breakfast.   Multiple Vitamin (MULTIVITAMIN WITH MINERALS) TABS tablet Take 1 tablet by mouth daily.   Omega-3 Fatty Acids (FISH OIL ) 1000 MG CAPS Take 1 capsule (1,000 mg total) by mouth daily.   tetrahydrozoline 0.05 % ophthalmic solution Place 2 drops into both eyes daily as needed (for dry eyes).   venlafaxine  XR (EFFEXOR -XR) 75 MG 24 hr capsule Take 1 capsule (75 mg total) by mouth daily with breakfast.   No facility-administered encounter medications on file as of 04/14/2024.     Allergies (verified) Patient has no known allergies.   History: Past Medical History:  Diagnosis Date   Cataract 01/2018   BL - Dr. Gennie Kicks   Diabetes mellitus Dx 1998   High cholesterol    Hip pain    Other and unspecified hyperlipidemia    Personal history of unspecified circulatory disease    S/P cardiac cath June 2008   abnormal cardiolite. LVH..question on prior studies and question septal hypertroph...not seen echo... EF 65%   Past Surgical History:  Procedure Laterality Date   CHOLECYSTECTOMY     Family History  Problem Relation Age of Onset   Hypertension Mother    Hypertension Father    Heart disease Father    Stroke Other    Diabetes Other    Arthritis Other    Hypertension Sister    Hypertension Brother    Colon cancer Neg Hx    Social History   Socioeconomic History   Marital status: Single    Spouse name: Not on file   Number of children: Not on file   Years of education: Not on file   Highest education level: Not on file  Occupational History   Not on file  Tobacco Use   Smoking status: Never   Smokeless tobacco: Never  Substance and Sexual Activity   Alcohol use: No   Drug use: No   Sexual activity: Not on file  Other Topics Concern   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    Social Drivers of Corporate investment banker Strain: Low Risk  (04/06/2024)   Overall Financial Resource Strain (CARDIA)    Difficulty of Paying Living Expenses: Not very hard  Food Insecurity: No Food Insecurity (04/06/2024)   Hunger Vital Sign    Worried About Running Out of Food in the Last Year: Never true    Ran Out of Food in the Last Year: Never true  Transportation Needs: No Transportation Needs (04/06/2024)   PRAPARE - Administrator, Civil Service (Medical): No    Lack of Transportation (Non-Medical): No  Physical Activity: Inactive (04/06/2024)   Exercise Vital Sign    Days of  Exercise per Week: 0 days    Minutes of Exercise per Session: 0 min  Stress: No Stress Concern Present (04/06/2024)   [employer] of Occupational Health - Occupational Stress Questionnaire    Feeling of Stress : Not at all  Social Connections: Moderately Integrated (04/06/2024)   Social Connection and Isolation Panel [NHANES]    Frequency of Communication with Friends and Family: Twice a week    Frequency of Social Gatherings with Friends and Family: More than three times a week    Attends Religious Services: More than 4 times per year    Active Member of [employer] or Organizations: Yes    Attends Banker Meetings: More than 4 times per year  Marital Status: Never married    Tobacco Counseling Counseling given: Not Answered    Clinical Intake:  Pre-visit preparation completed: Yes  Pain : No/denies pain Pain Score: 0-No pain     BMI - recorded: 34.2 Nutritional Status: BMI > 30  Obese Nutritional Risks: None Diabetes: Yes CBG done?: No Did pt. bring in CBG monitor from home?: No  Lab Results  Component Value Date   HGBA1C 7.0 11/26/2023   HGBA1C 7.3 (A) 07/22/2023   HGBA1C 6.8 03/19/2023     How often do you need to have someone help you when you read instructions, pamphlets, or other written materials from your doctor or pharmacy?: 1 - Never  Interpreter Needed?: No  Information entered by :: Eimi Viney N. Mallory Schaad, LPN.   Activities of Daily Living     04/14/2024    4:56 PM  In your present state of health, do you have any difficulty performing the following activities:  Hearing? 0  Vision? 0  Difficulty concentrating or making decisions? 0  Comment BSE: READING, PUZZLES, WORD SEARCH  Walking or climbing stairs? 0  Dressing or bathing? 0  Doing errands, shopping? 0  Preparing Food and eating ? N  Using the Toilet? N  In the past six months, have you accidently leaked urine? N  Do you have problems with loss of bowel control? N  Managing  your Medications? N  Managing your Finances? N  Housekeeping or managing your Housekeeping? N    Patient Care Team: Lawrance Presume, MD as PCP - General (Internal Medicine) Maris Sickle, MD as Referring Physician (Ophthalmology)  Indicate any recent Medical Services you may have received from other than [facility] providers in the past year (date may be approximate).     Assessment:   This is a routine wellness examination for [place].  Hearing/Vision screen Hearing Screening - Comments:: Denies hearing difficulties.   Vision Screening - Comments:: Wears rx glasses - up to date with routine eye exams with Maris Sickle, MD.    Goals Addressed             This Visit's Progress    Client understands the importance of follow-up with providers by attending scheduled visits       04/14/2024: I want to lose weight and get below 180 pounds.       Depression Screen     04/14/2024    4:56 PM 04/06/2024   12:03 PM 11/26/2023   10:16 AM 11/26/2023   10:15 AM 07/22/2023   10:18 AM 03/19/2023    9:16 AM 11/22/2022    9:07 AM  PHQ 2/9 Scores  PHQ - 2 Score 0 0 0 0 0 0 0  PHQ- 9 Score 0 0    0 1    Fall Risk     04/14/2024    5:05 PM 11/26/2023   10:15 AM 07/22/2023   10:11 AM 03/19/2023    9:10 AM 02/08/2023    9:57 AM  Fall Risk   Falls in the past year? 0 0 0 0 1  Number falls in past yr: 0 0 0 0 0  Injury with Fall? 0 0 0 0 0  Risk for fall due to : No Fall Risks No Fall Risks No Fall Risks No Fall Risks Other (Comment)  Follow up Falls prevention discussed;Falls evaluation completed        MEDICARE RISK AT HOME:  Medicare Risk at Home Any stairs in or around the home?: No If so,  are there any without handrails?: No Home free of loose throw rugs in walkways, pet beds, electrical cords, etc?: Yes Adequate lighting in your home to reduce risk of falls?: Yes Life alert?: Yes Use of a cane, walker or w/c?: No Grab bars in the bathroom?: No Shower chair or bench in shower?:  No Elevated toilet seat or a handicapped toilet?: No  TIMED UP AND GO:  Was the test performed?  No  Cognitive Function: 6CIT completed    04/14/2024    4:56 PM 02/08/2023    9:59 AM 06/28/2021    3:23 PM 06/23/2020    8:48 AM  MMSE - Mini Mental State Exam  Not completed: Unable to complete     Orientation to time  5 5 5   Orientation to Place  5 5 5   Registration  3 3 3   Attention/ Calculation  5 5 4   Recall  3 2 1   Language- name 2 objects  2 2 2   Language- repeat  1 1 1   Language- follow 3 step command  3 3 3   Language- read & follow direction  1 1 1   Write a sentence  1 1 1   Copy design  1 1 1   Total score  30 29 27         04/14/2024    4:53 PM 02/08/2023   10:01 AM  6CIT Screen  What Year? 0 points 0 points  What month? 0 points 0 points  What time? 0 points 0 points  Count back from 20 0 points 0 points  Months in reverse 0 points 0 points  Repeat phrase 0 points 0 points  Total Score 0 points 0 points    Immunizations Immunization History  Administered Date(s) Administered   Influenza Inj Mdck Quad Pf 11/22/2022   Influenza, Seasonal, Injecte, Preservative Fre 11/26/2023   Influenza,inj,Quad PF,6+ Mos 10/20/2014, 09/13/2016, 10/22/2017, 08/29/2018, 11/04/2019, 09/29/2020, 10/02/2021   PFIZER(Purple Top)SARS-COV-2 Vaccination 03/10/2020, 04/05/2020, 11/14/2020   Pfizer(Comirnaty)Fall Seasonal Vaccine 12 years and older 11/26/2023   Pneumococcal Conjugate (Pcv15) 11/14/2021   Pneumococcal Polysaccharide-23 11/08/2016   Respiratory Syncytial Virus Vaccine ,Recomb Aduvanted(Arexvy ) 11/22/2022   Tdap 12/20/2016   Zoster Recombinant(Shingrix) 06/23/2020, 11/03/2021    Screening Tests Health Maintenance  Topic Date Due   Colonoscopy  03/23/2024   FOOT EXAM  03/18/2024   INFLUENZA VACCINE  07/24/2024   HEMOGLOBIN A1C  08/28/2024   OPHTHALMOLOGY EXAM  09/02/2024   Diabetic kidney evaluation - eGFR measurement  02/25/2025   Diabetic kidney evaluation - Urine  ACR  04/06/2025   Medicare Annual Wellness (AWV)  04/14/2025   MAMMOGRAM  07/16/2025   Cervical Cancer Screening (HPV/Pap Cotest)  11/14/2026   DTaP/Tdap/Td (2 - Td or Tdap) 12/20/2026   Pneumococcal Vaccine 69-7 Years old  Completed   COVID-19 Vaccine  Completed   Hepatitis C Screening  Completed   HIV Screening  Completed   Zoster Vaccines- Shingrix  Completed   HPV VACCINES  Aged Out   Meningococcal B Vaccine  Aged Out    Health Maintenance  Health Maintenance Due  Topic Date Due   Colonoscopy  03/23/2024   FOOT EXAM  03/18/2024   Health Maintenance Items Addressed: Referral sent to GI for colonoscopy, Diabetic Foot Exam scheduled  Additional Screening:  Vision Screening: Recommended annual ophthalmology exams for early detection of glaucoma and other disorders of the eye.  Dental Screening: Recommended annual dental exams for proper oral hygiene  Community Resource Referral / Chronic Care Management: CRR required this  visit?  No   CCM required this visit?  No     Plan:     I have personally reviewed and noted the following in the patient's chart:   Medical and social history Use of alcohol, tobacco or illicit drugs  Current medications and supplements including opioid prescriptions. Patient is not currently taking opioid prescriptions. Functional ability and status Nutritional status Physical activity Advanced directives List of other physicians Hospitalizations, surgeries, and ER visits in previous 12 months Vitals Screenings to include cognitive, depression, and falls Referrals and appointments  In addition, I have reviewed and discussed with patient certain preventive protocols, quality metrics, and best practice recommendations. A written personalized care plan for preventive services as well as general preventive health recommendations were provided to patient.     Margette Sheldon, LPN   1/61/0960   After Visit Summary: (Mail) Due to this  being a telephonic visit, the after visit summary with patients personalized plan was offered to patient via mail   Notes: Please refer to Routing Comments.

## 2024-04-29 ENCOUNTER — Encounter: Payer: Self-pay | Admitting: Internal Medicine

## 2024-05-08 ENCOUNTER — Ambulatory Visit
Admission: RE | Admit: 2024-05-08 | Discharge: 2024-05-08 | Disposition: A | Discharge status: Home / Self Care | Source: Ambulatory Visit | Attending: Internal Medicine | Admitting: Internal Medicine

## 2024-05-08 DIAGNOSIS — R2232 Localized swelling, mass and lump, left upper limb: Secondary | ICD-10-CM | POA: Diagnosis not present

## 2024-05-08 MED ORDER — GADOPICLENOL 0.5 MMOL/ML IV SOLN
8.0000 mL | Freq: Once | INTRAVENOUS | Status: AC | PRN
  Administered 2024-05-08: 8 mL via INTRAVENOUS

## 2024-05-12 ENCOUNTER — Encounter: Payer: Self-pay | Admitting: Pediatrics

## 2024-05-13 ENCOUNTER — Ambulatory Visit: Payer: Self-pay | Admitting: Internal Medicine

## 2024-05-13 DIAGNOSIS — R2232 Localized swelling, mass and lump, left upper limb: Secondary | ICD-10-CM

## 2024-05-13 NOTE — Telephone Encounter (Signed)
 Phone call please to patient today to go over the results of the MRI of the left forearm.  2 patient identifiers used. Patient informed that the MRI shows that the mass in the muscle of her forearm is a lipoma with benign features.  However it did show enlargement of the ulnar nerve suggesting ulnar neuritis.  Patient did complain of feeling like her fingers drawing up at times and some numbness in the fingers which she states is still going on.  She does not feel that the mass has increased in size since she last saw me.  Report does not indicate whether the mass presses on the ulnar nerve are not.  However given these concerns, I recommend that we refer her to orthopedics for the specialist to take a look at it and determine whether this needs to be removed if it is causing any compression on the ulnar nerve versus observation. Pt agreeable to plan.

## 2024-05-14 DIAGNOSIS — N183 Chronic kidney disease, stage 3 unspecified: Secondary | ICD-10-CM | POA: Diagnosis not present

## 2024-05-14 DIAGNOSIS — N189 Chronic kidney disease, unspecified: Secondary | ICD-10-CM | POA: Diagnosis not present

## 2024-05-14 DIAGNOSIS — R809 Proteinuria, unspecified: Secondary | ICD-10-CM | POA: Diagnosis not present

## 2024-05-14 DIAGNOSIS — E1122 Type 2 diabetes mellitus with diabetic chronic kidney disease: Secondary | ICD-10-CM | POA: Diagnosis not present

## 2024-05-14 DIAGNOSIS — D631 Anemia in chronic kidney disease: Secondary | ICD-10-CM | POA: Diagnosis not present

## 2024-05-14 DIAGNOSIS — I129 Hypertensive chronic kidney disease with stage 1 through stage 4 chronic kidney disease, or unspecified chronic kidney disease: Secondary | ICD-10-CM | POA: Diagnosis not present

## 2024-05-15 LAB — LAB REPORT - SCANNED
Creatinine, POC: 65.4 mg/dL
EGFR: 40

## 2024-06-04 ENCOUNTER — Ambulatory Visit: Admitting: Orthopedic Surgery

## 2024-06-04 ENCOUNTER — Other Ambulatory Visit: Payer: Self-pay

## 2024-06-04 DIAGNOSIS — R2232 Localized swelling, mass and lump, left upper limb: Secondary | ICD-10-CM

## 2024-06-04 NOTE — Progress Notes (Signed)
 [name] Barker - 63 y.o. female MRN [id number]  Date of birth: 07/28/1961  Office Visit Note: Visit Date: 06/04/2024 PCP: Lawrance Presume, MD Referred by: Lawrance Presume, MD  Subjective: No chief complaint on file.  HPI: [name] Barker is a pleasant 63 y.o. female who presents today for for evaluation of a left forearm mass that has been present for approximately 6 months.  Recurrence is deemed spontaneous in nature, denies any significant trauma or injury.  No other history of prior masses throughout the body.  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  She states initially she was having some discomfort in this area, this has resolved, pain is well-controlled, denies any numbness or tingling, has full use of the forearm and hand without significant restriction currently.  Pertinent ROS were reviewed with the patient and found to be negative unless otherwise specified above in HPI.   Visit Reason: Left forearm mass Duration of symptoms:appeared 6 months ago Hand dominance: right Occupation:retired Diabetic: Yes Smoking: No Heart/Lung History:none Blood Thinners: aspirin   Prior Testing/EMG:Mri on 05/12/24 Injections (Date):none Treatments:none Prior Surgery:none  Assessment & Plan: Visit Diagnoses:  1. Mass of left forearm     Plan: Extensive discussion was had with the patient today regarding her mass of the left forearm.  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  This is consistent with her clinical examination today.  The mass is soft and compressible, there is no significant tenderness in this area, there is no evidence of neurovascular compromise distally throughout the hand or forearm.  We discussed the underlying etiology and pathophysiology  of the lipoma as well as possible treatment ranging from conservative to surgical.  From a conservative standpoint, we discussed ongoing observation, activity modification as needed.  From a surgical standpoint, I discussed with her the possibility for surgical excision as well as the appropriate postoperative protocol and need for potential therapy.  We discussed risk and benefits of the surgical intervention as well.  Given her minimal symptoms at this time, she would like to continue with conservative measures which I do feel is appropriate based on her workup.  We did discuss the benign nature of a lipoma, surgical excision would be more beneficial if she was having significant symptoms in this region.  I did explain to her that should symptoms progress from a pain, discomfort or numbness and tingling standpoint, she would be better indicated for potential surgical excision at that time.  She expressed full understanding and will return to me in the future as needed.  I spent 45 minutes in the care of this patient today including review of previous documentation, imaging obtained, face-to-face time discussing all options regarding treatment and documenting the encounter.   Follow-up: No follow-ups on file.   Meds & Orders: No orders of the defined types were placed in this encounter.   Orders Placed This Encounter  Procedures   XR Forearm Left     Procedures: No procedures performed      Clinical History: No specialty comments available.  She reports that she has never smoked. She has never used smokeless tobacco.  Recent Labs    07/22/23 1021 11/26/23 1023  HGBA1C 7.3* 7.0    Objective:   Vital Signs: LMP 12/25/2015   Physical Exam  Gen: Well-appearing, in no acute distress; non-toxic CV: Regular Rate. Well-perfused. Warm.  Resp: Breathing unlabored on room air; no wheezing. Psych: Fluid speech in conversation; appropriate affect; normal thought process  Ortho Exam Left  forearm: - Palpable mass over the dorsal aspect of the forearm, just distal to the elbow region, measures approximately 7 cm x 5 cm, soft, compressible, mobile, minimally tender - Full digital range of motion, sensation intact in all distributions median/radial/ulnar, AIN/PIN/interosseous intact, hand mains warm well-perfused - Elbow range of motion is fully preserved flexion/extension 0/150, pronation/supination 80/80 throughout the forearm  Imaging: No results found. Prior MRI of the left forearm was reviewed in detail today  Past Medical/Family/Surgical/Social History: Medications & Allergies reviewed per EMR, new medications updated. Patient Active Problem List   Diagnosis Date Noted   Claudication of gluteal region [facility]) 03/19/2023   Class 1 obesity due to excess calories with serious comorbidity and body mass index (BMI) of 33.0 to 33.9 in adult 06/01/2021   Stage 3b chronic kidney disease (HCC) 11/30/2020   Raynaud's phenomenon without gangrene 09/29/2020   Anemia in stage 3a chronic kidney disease (HCC) 09/29/2020   Anemia, chronic disease 02/07/2020   COVID-19 virus infection 09/28/2019   Type II diabetes mellitus with renal manifestations (HCC) 09/28/2019   HTN (hypertension) 09/28/2019   HLD (hyperlipidemia) 09/28/2019   Insomnia due to medical condition 09/13/2016   Perimenopausal 09/13/2016   Hyperlipidemia associated with type 2 diabetes mellitus (HCC) 07/22/2014   Esophageal reflux 07/22/2014   Diabetes (HCC) 04/22/2014   Essential hypertension, benign 04/22/2014   Past Medical History:  Diagnosis Date   Cataract 01/2018   BL - Dr. Gennie Kicks   Diabetes mellitus Dx 1998   High cholesterol    Hip pain    Other and unspecified hyperlipidemia    Personal history of unspecified circulatory disease    S/P cardiac cath June 2008   abnormal cardiolite. LVH..question on prior studies and question septal hypertroph...not seen echo... EF 65%   Family History  Problem  Relation Age of Onset   Hypertension Mother    Hypertension Father    Heart disease Father    Stroke Other    Diabetes Other    Arthritis Other    Hypertension Sister    Hypertension Brother    Colon cancer Neg Hx    Past Surgical History:  Procedure Laterality Date   CHOLECYSTECTOMY     Social History   Occupational History   Not on file  Tobacco Use   Smoking status: Never   Smokeless tobacco: Never  Substance and Sexual Activity   Alcohol use: No   Drug use: No   Sexual activity: Not on file    Horice Carrero Merlinda Starling) Marce Sensing, M.D. [facility], Hand Surgery

## 2024-06-17 ENCOUNTER — Ambulatory Visit (AMBULATORY_SURGERY_CENTER)

## 2024-06-17 ENCOUNTER — Encounter: Payer: Self-pay | Admitting: Pediatrics

## 2024-06-17 VITALS — Ht 61.0 in | Wt 180.0 lb

## 2024-06-17 DIAGNOSIS — Z1211 Encounter for screening for malignant neoplasm of colon: Secondary | ICD-10-CM

## 2024-06-17 MED ORDER — NA SULFATE-K SULFATE-MG SULF 17.5-3.13-1.6 GM/177ML PO SOLN: 1.0000 | Freq: Once | ORAL | 0 refills | Status: AC

## 2024-06-17 NOTE — Progress Notes (Signed)
 No egg or soy allergy known to patient  No issues known to pt with past sedation with any surgeries or procedures Patient denies ever being told they had issues or difficulty with intubation  No FH of Malignant Hyperthermia Pt is not on diet pills Pt is not on  home 02  Pt is not on blood thinners  Pt denies issues with constipation  No A fib or A flutter Have any cardiac testing pending-- no  LOA: independent  Prep: suprep   Patient's chart reviewed by Norleen Schillings CNRA prior to previsit and patient appropriate for the [facility].  Previsit completed and red dot placed by patient's name on their procedure day (on provider's schedule).     PV completed with patient. Pt made aware during PV apt prep instructions may not arrive via mail before Monday. Offered patient to pick up instructions. Pt opted to write instructions during apt. Phone number provided if she has any questions.

## 2024-06-22 NOTE — Progress Notes (Unsigned)
 [facility] History and Physical   Primary Care Physician:  Vicci Barnie NOVAK, MD   Reason for Procedure:  Colorectal cancer screening  Plan:    Screening colonoscopy     HPI: [name] Barker is a 63 y.o. female undergoing screening colonoscopy for colorectal cancer screening.  Last colonoscopy performed in 2015 was normal without polyps.  No family history of colorectal cancer or polyps.  Patient denies current symptoms of change in bowel habits or rectal bleeding at the time of this exam.   Past Medical History:  Diagnosis Date   Cataract 01/2018   BL - Dr. Roz   Diabetes mellitus Dx 1998   High cholesterol    Hip pain    Other and unspecified hyperlipidemia    Personal history of unspecified circulatory disease    S/P cardiac cath June 2008   abnormal cardiolite. LVH..question on prior studies and question septal hypertroph...not seen echo... EF 65%    Past Surgical History:  Procedure Laterality Date   CHOLECYSTECTOMY      Prior to Admission medications   Medication Sig Start Date End Date Taking? Authorizing Provider  Accu-Chek Softclix Lancets lancets Use as instructed 10/02/21   Vicci Barnie NOVAK, MD  amLODipine  (NORVASC ) 10 MG tablet Take 1 tablet (10 mg total) by mouth daily. 04/06/24   Vicci Barnie NOVAK, MD  ASPIRIN  LOW DOSE 81 MG EC tablet TAKE 1 TABLET (81 MG TOTAL) BY MOUTH DAILY. 06/21/20   Vicci Barnie NOVAK, MD  atorvastatin  (LIPITOR) 40 MG tablet TAKE 1 TABLET BY MOUTH DAILY 02/17/24   Vicci Barnie NOVAK, MD  Blood Glucose Monitoring Suppl (ACCU-CHEK GUIDE) w/Device KIT UAD 10/02/21   Vicci Barnie NOVAK, MD  FARXIGA 10 MG TABS tablet Take 10 mg by mouth daily. 03/05/22   [provider]  glimepiride  (AMARYL ) 2 MG tablet TAKE 1 AND 1/2 TABLETS BY MOUTH  DAILY BEFORE BREAKFAST 02/28/24   Vicci Barnie NOVAK, MD  glucose blood (ACCU-CHEK GUIDE) test strip Use as instructed 10/02/21   Vicci Barnie NOVAK, MD  lisinopril  (ZESTRIL ) 10 MG  tablet Take 10 mg by mouth daily. 09/13/16   [provider]  losartan  (COZAAR ) 50 MG tablet TAKE 1 TABLET BY MOUTH DAILY 02/10/24   Vicci Barnie NOVAK, MD  metFORMIN  (GLUCOPHAGE ) 500 MG tablet Take 1 tablet (500 mg total) by mouth daily with breakfast. 04/06/24   Vicci Barnie NOVAK, MD  Multiple Vitamin (MULTIVITAMIN WITH MINERALS) TABS tablet Take 1 tablet by mouth daily. 10/17/17   Danton Jon HERO, PA-C  Omega-3 Fatty Acids (FISH OIL ) 1000 MG CAPS Take 1 capsule (1,000 mg total) by mouth daily. 10/17/17   Danton Jon HERO, PA-C  tetrahydrozoline 0.05 % ophthalmic solution Place 2 drops into both eyes daily as needed (for dry eyes).    [provider]  venlafaxine  XR (EFFEXOR -XR) 75 MG 24 hr capsule Take 1 capsule (75 mg total) by mouth daily with breakfast. 04/06/24   Vicci Barnie NOVAK, MD    Current Outpatient Medications  Medication Sig Dispense Refill   amLODipine  (NORVASC ) 10 MG tablet Take 1 tablet (10 mg total) by mouth daily. 100 tablet 2   ASPIRIN  LOW DOSE 81 MG EC tablet TAKE 1 TABLET (81 MG TOTAL) BY MOUTH DAILY. 90 tablet 6   atorvastatin  (LIPITOR) 40 MG tablet TAKE 1 TABLET BY MOUTH DAILY 100 tablet 2   FARXIGA 10 MG TABS tablet Take 10 mg by mouth daily.     glimepiride  (AMARYL ) 2 MG tablet TAKE 1  AND 1/2 TABLETS BY MOUTH  DAILY BEFORE BREAKFAST 150 tablet 2   lisinopril  (ZESTRIL ) 10 MG tablet Take 10 mg by mouth daily.     losartan  (COZAAR ) 50 MG tablet TAKE 1 TABLET BY MOUTH DAILY 100 tablet 2   metFORMIN  (GLUCOPHAGE ) 500 MG tablet Take 1 tablet (500 mg total) by mouth daily with breakfast. 90 tablet 1   Multiple Vitamin (MULTIVITAMIN WITH MINERALS) TABS tablet Take 1 tablet by mouth daily. 100 tablet 0   Omega-3 Fatty Acids (FISH OIL ) 1000 MG CAPS Take 1 capsule (1,000 mg total) by mouth daily. 100 capsule 1   tetrahydrozoline 0.05 % ophthalmic solution Place 2 drops into both eyes daily as needed (for dry eyes).     venlafaxine  XR (EFFEXOR -XR) 75 MG 24 hr  capsule Take 1 capsule (75 mg total) by mouth daily with breakfast. 90 capsule 1   Accu-Chek Softclix Lancets lancets Use as instructed 100 each 12   Blood Glucose Monitoring Suppl (ACCU-CHEK GUIDE) w/Device KIT UAD 1 kit 0   glucose blood (ACCU-CHEK GUIDE) test strip Use as instructed 100 each 12   Current Facility-Administered Medications  Medication Dose Route Frequency Provider Last Rate Last Admin   0.9 %  sodium chloride  infusion  500 mL Intravenous Once Kyomi Hector, Inocente HERO, MD       dextrose 5 % solution   Intravenous Continuous Alexine Pilant, Inocente HERO, MD        Allergies as of 06/23/2024   (No Known Allergies)    Family History  Problem Relation Age of Onset   Hypertension Mother    Hypertension Father    Heart disease Father    Hypertension Sister    Hypertension Brother    Stroke Other    Diabetes Other    Arthritis Other    Colon cancer Neg Hx    Rectal cancer Neg Hx    Stomach cancer Neg Hx    Esophageal cancer Neg Hx     Social History   Socioeconomic History   Marital status: Single    Spouse name: Not on file   Number of children: Not on file   Years of education: Not on file   Highest education level: Not on file  Occupational History   Not on file  Tobacco Use   Smoking status: Never   Smokeless tobacco: Never  Substance and Sexual Activity   Alcohol use: No   Drug use: No   Sexual activity: Not on file  Other Topics Concern   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    Social Drivers of Corporate investment banker Strain: Low Risk  (04/06/2024)   Overall Financial Resource Strain (CARDIA)    Difficulty of Paying Living Expenses: Not very hard  Food Insecurity: No Food Insecurity (04/06/2024)   Hunger Vital Sign    Worried About Running Out of Food in the Last Year: Never true    Ran Out of Food in the Last Year: Never true  Transportation Needs: No Transportation Needs (04/06/2024)   PRAPARE  - Administrator, Civil Service (Medical): No    Lack of Transportation (Non-Medical): No  Physical Activity: Inactive (04/06/2024)   Exercise Vital Sign    Days of Exercise per Week: 0 days    Minutes of Exercise per Session: 0 min  Stress: No Stress Concern Present (04/06/2024)   [employer] of Occupational Health - Occupational  Stress Questionnaire    Feeling of Stress : Not at all  Social Connections: Moderately Integrated (04/06/2024)   Social Connection and Isolation Panel    Frequency of Communication with Friends and Family: Twice a week    Frequency of Social Gatherings with Friends and Family: More than three times a week    Attends Religious Services: More than 4 times per year    Active Member of [employer] or Organizations: Yes    Attends Banker Meetings: More than 4 times per year    Marital Status: Never married  Intimate Partner Violence: Not At Risk (04/06/2024)   Humiliation, Afraid, Rape, and Kick questionnaire    Fear of Current or Ex-Partner: No    Emotionally Abused: No    Physically Abused: No    Sexually Abused: No    Review of Systems:  All other review of systems negative except as mentioned in the HPI.  Physical Exam: Vital signs BP (!) 143/82   Pulse 86   Temp (!) 97.4 F (36.3 C)   Ht 5' 1 (1.549 m)   Wt 180 lb (81.6 kg)   LMP 12/25/2015   SpO2 96%   BMI 34.01 kg/m   General:   Alert,  Well-developed, well-nourished, pleasant and cooperative in NAD Airway:  Mallampati 2 Lungs:  Clear throughout to auscultation.   Heart:  Regular rate and rhythm; no murmurs, clicks, rubs,  or gallops. Abdomen:  Soft, nontender and nondistended. Normal bowel sounds.   Neuro/Psych:  Normal mood and affect. A and O x 3  Inocente Hausen, MD [facility]

## 2024-06-23 ENCOUNTER — Encounter: Payer: Self-pay | Admitting: Pediatrics

## 2024-06-23 ENCOUNTER — Ambulatory Visit (AMBULATORY_SURGERY_CENTER): Admitting: Pediatrics

## 2024-06-23 VITALS — BP 136/77 | HR 87 | Temp 97.4°F | Resp 12 | Ht 61.0 in | Wt 180.0 lb

## 2024-06-23 DIAGNOSIS — Z1211 Encounter for screening for malignant neoplasm of colon: Secondary | ICD-10-CM | POA: Diagnosis not present

## 2024-06-23 DIAGNOSIS — K648 Other hemorrhoids: Secondary | ICD-10-CM | POA: Diagnosis not present

## 2024-06-23 MED ORDER — DEXTROSE 5 % IV SOLN: INTRAVENOUS | Status: AC

## 2024-06-23 MED ORDER — SODIUM CHLORIDE 0.9 % IV SOLN: 500.0000 mL | Freq: Once | INTRAVENOUS | Status: AC

## 2024-06-23 NOTE — Progress Notes (Signed)
 Vss nad trans to pacu

## 2024-06-23 NOTE — Op Note (Signed)
 [facility] Patient Name: [name] Barker Procedure Date: 06/23/2024 12:26 PM MRN: [id number] Endoscopist: Inocente Hausen , MD, [id number] Age: 63 Referring MD:  Date of Birth: 01/12/1961 Gender: Female Account #: [id number] Procedure:                Colonoscopy Indications:              Screening for colorectal malignant neoplasm, Last                            colonoscopy: 2015 Medicines:                Monitored Anesthesia Care Procedure:                Pre-Anesthesia Assessment:                           - Prior to the procedure, a History and Physical                            was performed, and patient medications and                            allergies were reviewed. The patient's tolerance of                            previous anesthesia was also reviewed. The risks                            and benefits of the procedure and the sedation                            options and risks were discussed with the patient.                            All questions were answered, and informed consent                            was obtained. Prior Anticoagulants: The patient has                            taken no anticoagulant or antiplatelet agents. ASA                            Grade Assessment: II - A patient with mild systemic                            disease. After reviewing the risks and benefits,                            the patient was deemed in satisfactory condition to                            undergo the procedure.  After obtaining informed consent, the colonoscope                            was passed under direct vision. Throughout the                            procedure, the patient's blood pressure, pulse, and                            oxygen saturations were monitored continuously. The                            Colonoscope was introduced through the anus and                            advanced to the cecum, identified by  appendiceal                            orifice and ileocecal valve. The colonoscopy was                            performed without difficulty. The patient tolerated                            the procedure well. The quality of the bowel                            preparation was unsatisfactory. The ileocecal                            valve, appendiceal orifice, and rectum were                            photographed. Scope In: 12:50:13 PM Scope Out: 1:14:46 PM Scope Withdrawal Time: 0 hours 13 minutes 55 seconds  Total Procedure Duration: 0 hours 24 minutes 33 seconds  Findings:                 The perianal and digital rectal examinations were                            normal. Pertinent negatives include normal                            sphincter tone and no palpable rectal lesions.                           A moderate amount of semi-liquid stool was found in                            the entire colon, interfering with visualization.                            Lavage of the area was performed using a large  amount of sterile water, resulting in clearance                            with fair visualization.                           The exam was otherwise without abnormality on                            direct and retroflexion views.                           The colon (entire examined portion) appeared normal.                           Internal hemorrhoids were found during retroflexion. Complications:            No immediate complications. Estimated Blood Loss:     Estimated blood loss: none. Impression:               - Preparation of the colon was unsatisfactory.                           - Stool in the entire examined colon.                           - The examination was otherwise normal on direct                            and retroflexion views.                           - The entire examined colon is normal.                           - Internal  hemorrhoids.                           - No specimens collected. Recommendation:           - Discharge patient to home (ambulatory).                           - Repeat colonoscopy in 3 years because the bowel                            preparation was fair. No masses or large polyps                            were seen, however, small polyps could have been                            obscured by stool. Recommend a 2-day bowel prep                            prior to next colonoscopy.                           -  The findings and recommendations were discussed                            with the patient's family.                           - Return to referring physician.                           - Patient has a contact number available for                            emergencies. The signs and symptoms of potential                            delayed complications were discussed with the                            patient. Return to normal activities tomorrow.                            Written discharge instructions were provided to the                            patient. Inocente Hausen, MD 06/23/2024 1:22:26 PM This report has been signed electronically.

## 2024-06-23 NOTE — Progress Notes (Signed)
 Pt's states no medical or surgical changes since previsit or office visit.

## 2024-06-23 NOTE — Patient Instructions (Signed)
 Please read handouts provided. Repeat colonoscopy in 3 years. Return to referring physician.   YOU HAD AN ENDOSCOPIC PROCEDURE TODAY AT THE [facility]:   Refer to the procedure report that was given to you for any specific questions about what was found during the examination.  If the procedure report does not answer your questions, please call your gastroenterologist to clarify.  If you requested that your care partner not be given the details of your procedure findings, then the procedure report has been included in a sealed envelope for you to review at your convenience later.  YOU SHOULD EXPECT: Some feelings of bloating in the abdomen. Passage of more gas than usual.  Walking can help get rid of the air that was put into your GI tract during the procedure and reduce the bloating. If you had a lower endoscopy (such as a colonoscopy or flexible sigmoidoscopy) you may notice spotting of blood in your stool or on the toilet paper. If you underwent a bowel prep for your procedure, you may not have a normal bowel movement for a few days.  Please Note:  You might notice some irritation and congestion in your nose or some drainage.  This is from the oxygen used during your procedure.  There is no need for concern and it should clear up in a day or so.  SYMPTOMS TO REPORT IMMEDIATELY:  Following lower endoscopy (colonoscopy or flexible sigmoidoscopy):  Excessive amounts of blood in the stool  Significant tenderness or worsening of abdominal pains  Swelling of the abdomen that is new, acute  Fever of 100F or higher.  For urgent or emergent issues, a gastroenterologist can be reached at any hour by calling [phone]. Do not use MyChart messaging for urgent concerns.    DIET:  We do recommend a small meal at first, but then you may proceed to your regular diet.  Drink plenty of fluids but you should avoid alcoholic beverages for 24 hours.  ACTIVITY:  You should plan to take it  easy for the rest of today and you should NOT DRIVE or use heavy machinery until tomorrow (because of the sedation medicines used during the test).    FOLLOW UP: Our staff will call the number listed on your records the next business day following your procedure.  We will call around 7:15- 8:00 am to check on you and address any questions or concerns that you may have regarding the information given to you following your procedure. If we do not reach you, we will leave a message.     If any biopsies were taken you will be contacted by phone or by letter within the next 1-3 weeks.  Please call us  at [phone] if you have not heard about the biopsies in 3 weeks.    SIGNATURES/CONFIDENTIALITY: You and/or your care partner have signed paperwork which will be entered into your electronic medical record.  These signatures attest to the fact that that the information above on your After Visit Summary has been reviewed and is understood.  Full responsibility of the confidentiality of this discharge information lies with you and/or your care-partner.

## 2024-06-24 ENCOUNTER — Telehealth: Payer: Self-pay | Admitting: *Deleted

## 2024-06-24 NOTE — Telephone Encounter (Signed)
 Attempted post procedure follow up call.  No answer - LVM.

## 2024-07-21 ENCOUNTER — Ambulatory Visit: Payer: 59

## 2024-08-14 ENCOUNTER — Other Ambulatory Visit: Payer: Self-pay | Admitting: Internal Medicine

## 2024-08-14 DIAGNOSIS — E1169 Type 2 diabetes mellitus with other specified complication: Secondary | ICD-10-CM

## 2024-08-17 NOTE — Telephone Encounter (Signed)
 Requested Prescriptions  Refused Prescriptions Disp Refills   metFORMIN  (GLUCOPHAGE ) 500 MG tablet [Pharmacy Med Name: metFORMIN  HCl 500 MG Oral Tablet] 100 tablet 2    Sig: TAKE 1 TABLET BY MOUTH DAILY  WITH BREAKFAST     Endocrinology:  Diabetes - Biguanides Failed - 08/17/2024  1:23 PM      Failed - B12 Level in normal range and within 720 days    Vitamin B-12  Date Value Ref Range Status  01/13/2018 433 232 - 1,245 pg/mL Final         Failed - CBC within normal limits and completed in the last 12 months    WBC  Date Value Ref Range Status  11/26/2023 6.5 3.4 - 10.8 x10E3/uL Final  10/02/2019 12.2 (H) 4.0 - 10.5 K/uL Final   RBC  Date Value Ref Range Status  11/26/2023 4.12 3.77 - 5.28 x10E6/uL Final  10/02/2019 3.65 (L) 3.87 - 5.11 MIL/uL Final   Hemoglobin  Date Value Ref Range Status  11/26/2023 12.0 11.1 - 15.9 g/dL Final   Hematocrit  Date Value Ref Range Status  11/26/2023 36.1 34.0 - 46.6 % Final   MCHC  Date Value Ref Range Status  11/26/2023 33.2 31.5 - 35.7 g/dL Final  89/90/7979 67.1 30.0 - 36.0 g/dL Final   [facility]  Date Value Ref Range Status  11/26/2023 29.1 26.6 - 33.0 pg Final  10/02/2019 28.2 26.0 - 34.0 pg Final   MCV  Date Value Ref Range Status  11/26/2023 88 79 - 97 fL Final   No results found for: PLTCOUNTKUC, LABPLAT, POCPLA RDW  Date Value Ref Range Status  11/26/2023 13.2 11.7 - 15.4 % Final         Passed - Cr in normal range and within 360 days    Creat  Date Value Ref Range Status  12/27/2015 0.94 0.50 - 1.05 mg/dL Final   Creatinine, Ser  Date Value Ref Range Status  11/26/2023 1.61 (H) 0.57 - 1.00 mg/dL Final   Creatinine, POC  Date Value Ref Range Status  05/15/2024 65.4 mg/dL Final   Creatinine, Urine  Date Value Ref Range Status  09/28/2019 87.86 mg/dL Final    Comment:    Performed at [facility], [address] [place], [place] [address]         Passed - HBA1C is between 0 and 7.9 and  within 180 days    HbA1c, POC (prediabetic range)  Date Value Ref Range Status  09/29/2020 6.4 5.7 - 6.4 % Final   HbA1c, POC (controlled diabetic range)  Date Value Ref Range Status  11/26/2023 7.0 0.0 - 7.0 % Final   A1c  Date Value Ref Range Status  02/26/2024 7.0  Final         Passed - eGFR in normal range and within 360 days    GFR, Est African American  Date Value Ref Range Status  01/13/2015 >89 mL/min Final   GFR calc Af Amer  Date Value Ref Range Status  09/29/2020 50 (L) >59 mL/min/1.73 Final    Comment:    **[facility] currently reports eGFR in compliance with the current**   recommendations of the [employer]. [facility] will   update reporting as new guidelines are published from the NKF-ASN   Task force.    GFR, Est Non African American  Date Value Ref Range Status  01/13/2015 88 mL/min Final    Comment:      The estimated GFR is a  calculation valid for adults (>=55 years old) that uses the CKD-EPI algorithm to adjust for age and sex. It is   not to be used for children, pregnant women, hospitalized patients,    patients on dialysis, or with rapidly changing kidney function. According to the NKDEP, eGFR >89 is normal, 60-89 shows mild impairment, 30-59 shows moderate impairment, 15-29 shows severe impairment and <15 is ESRD.      GFR calc non Af Amer  Date Value Ref Range Status  09/29/2020 43 (L) >59 mL/min/1.73 Final   eGFR  Date Value Ref Range Status  11/26/2023 36 (L) >59 mL/min/1.73 Final   EGFR  Date Value Ref Range Status  05/15/2024 40.0  Final    Comment:    Abstracted by HIM         Passed - Valid encounter within last 6 months    Recent Outpatient Visits           4 months ago Type 2 diabetes mellitus with obesity (HCC)   [facility] [place]. [facility] Vicci Sober B, MD   8 months ago Type 2 diabetes mellitus with obesity [facility])   [facility]  [facility] [place]. [facility] Vicci Sober NOVAK, MD   1 year ago Type 2 diabetes mellitus with obesity [facility])   [facility] Dept Of [place]. [facility] Vicci Sober NOVAK, MD   1 year ago Type 2 diabetes mellitus with obesity [facility])   [facility] Dept Of [place]. [facility] Vicci Sober NOVAK, MD   1 year ago Type 2 diabetes mellitus with obesity [facility])   [facility] Dept Of [place]. [facility] Vicci Sober NOVAK, MD

## 2024-08-31 DIAGNOSIS — E119 Type 2 diabetes mellitus without complications: Secondary | ICD-10-CM | POA: Diagnosis not present

## 2024-08-31 DIAGNOSIS — H40013 Open angle with borderline findings, low risk, bilateral: Secondary | ICD-10-CM | POA: Diagnosis not present

## 2024-08-31 DIAGNOSIS — H2513 Age-related nuclear cataract, bilateral: Secondary | ICD-10-CM | POA: Diagnosis not present

## 2024-08-31 DIAGNOSIS — H04123 Dry eye syndrome of bilateral lacrimal glands: Secondary | ICD-10-CM | POA: Diagnosis not present

## 2024-09-01 ENCOUNTER — Other Ambulatory Visit: Payer: Self-pay | Admitting: Internal Medicine

## 2024-09-01 DIAGNOSIS — E1169 Type 2 diabetes mellitus with other specified complication: Secondary | ICD-10-CM

## 2024-09-15 ENCOUNTER — Ambulatory Visit: Attending: Internal Medicine | Admitting: Internal Medicine

## 2024-09-15 ENCOUNTER — Encounter: Payer: Self-pay | Admitting: Internal Medicine

## 2024-09-15 VITALS — BP 126/74 | HR 93 | Temp 97.9°F | Ht 61.0 in | Wt 185.0 lb

## 2024-09-15 DIAGNOSIS — E1129 Type 2 diabetes mellitus with other diabetic kidney complication: Secondary | ICD-10-CM

## 2024-09-15 DIAGNOSIS — Z7984 Long term (current) use of oral hypoglycemic drugs: Secondary | ICD-10-CM | POA: Diagnosis not present

## 2024-09-15 DIAGNOSIS — D631 Anemia in chronic kidney disease: Secondary | ICD-10-CM | POA: Diagnosis not present

## 2024-09-15 DIAGNOSIS — Z23 Encounter for immunization: Secondary | ICD-10-CM | POA: Diagnosis not present

## 2024-09-15 DIAGNOSIS — E1159 Type 2 diabetes mellitus with other circulatory complications: Secondary | ICD-10-CM | POA: Diagnosis not present

## 2024-09-15 DIAGNOSIS — N1832 Chronic kidney disease, stage 3b: Secondary | ICD-10-CM | POA: Diagnosis not present

## 2024-09-15 DIAGNOSIS — R809 Proteinuria, unspecified: Secondary | ICD-10-CM | POA: Diagnosis not present

## 2024-09-15 DIAGNOSIS — I739 Peripheral vascular disease, unspecified: Secondary | ICD-10-CM

## 2024-09-15 DIAGNOSIS — E669 Obesity, unspecified: Secondary | ICD-10-CM | POA: Diagnosis not present

## 2024-09-15 DIAGNOSIS — I10 Essential (primary) hypertension: Secondary | ICD-10-CM

## 2024-09-15 DIAGNOSIS — E782 Mixed hyperlipidemia: Secondary | ICD-10-CM | POA: Diagnosis not present

## 2024-09-15 DIAGNOSIS — D1722 Benign lipomatous neoplasm of skin and subcutaneous tissue of left arm: Secondary | ICD-10-CM

## 2024-09-15 DIAGNOSIS — E1169 Type 2 diabetes mellitus with other specified complication: Secondary | ICD-10-CM

## 2024-09-15 DIAGNOSIS — I70213 Atherosclerosis of native arteries of extremities with intermittent claudication, bilateral legs: Secondary | ICD-10-CM | POA: Diagnosis not present

## 2024-09-15 DIAGNOSIS — E1121 Type 2 diabetes mellitus with diabetic nephropathy: Secondary | ICD-10-CM | POA: Diagnosis not present

## 2024-09-15 LAB — POCT GLYCOSYLATED HEMOGLOBIN (HGB A1C): HbA1c, POC (controlled diabetic range): 7 % (ref 0.0–7.0)

## 2024-09-15 LAB — GLUCOSE, POCT (MANUAL RESULT ENTRY): POC Glucose: 134 mg/dL — AB (ref 70–99)

## 2024-09-15 MED ORDER — LOSARTAN POTASSIUM 50 MG PO TABS: 50.0000 mg | ORAL_TABLET | Freq: Every day | ORAL | 2 refills | Status: AC

## 2024-09-15 MED ORDER — ATORVASTATIN CALCIUM 40 MG PO TABS: 40.0000 mg | ORAL_TABLET | Freq: Every day | ORAL | 2 refills | Status: AC

## 2024-09-15 MED ORDER — GLIMEPIRIDE 2 MG PO TABS: ORAL_TABLET | ORAL | 2 refills | Status: DC

## 2024-09-15 MED ORDER — AMLODIPINE BESYLATE 10 MG PO TABS: 10.0000 mg | ORAL_TABLET | Freq: Every day | ORAL | 2 refills | Status: AC

## 2024-09-15 NOTE — Patient Instructions (Addendum)
-   We have ordered the ankle brachial index test for peripheral artery disease because of your pain with walking  - We will check your kidney function today please stop by the lab

## 2024-09-15 NOTE — Progress Notes (Signed)
 Patient ID: [name] Barker, female    DOB: August 06, 1961  MRN: [id number]  CC: Diabetes (DM f/u. Med refills. Raguel about hands / feet - circulation /)   Subjective: [name] Barker is a 63 y.o. female who presents for chronic ds management. Her concerns today include:  Patient with history of DM (with macroalbumin, peripheral neuropathy) HL, HTN, perimenopausal (On Effexor ), anemia chronic ds, insomnia, CKD stage 3, BL complicated renal cyst, COVID pneumonia, ulnar neuritis   The patient reports she sometimes feels like her circulation is cut off. Her sx started about a year ago and have recently worsened. Her main concern is intermittent pain of the posterior thigh bilaterally when she walks more than 15 minutes, sometimes causing her to have to sit down. Similar symptoms in the feet. She also reports cold hands and feet. Sometimes her hands turn red and become itchy. Denies back pain when walking.   DM: A1C today: 7.0 POCT glucose: 134 Gained 5 pounds since 06/2024. Meds: glimepiride  2mg  (1.5 tablets) daily, Farxiga 10 mg daily and metformin  500 mg BID  Confirms good adherence to these diabetes medications. Is checking blood sugars at home, with values ranging from and 125-140 after meals. The patient sometimes adherence to a diabetic diet: drinks 1 diet soda, has candy bars in moderation. Eats vegetables daily like squash and brocolli, and eats fruits. Consumes cold cuts.  Exercise: Once a week goes to gym on treadmill for 30 mins, used to go to gym 2x a week but feet and legs have been bothering her as above.   Eye exam was done earlier this month. Saw them 08/31/2024. Will sign a release form so we can access these records.   HL: Atorvastatin  40mg  daily. LDL in 11/2023 was 112 with a goal less than 70. Will recheck in 11/2023.   HTN:  BP 126/74 initial. The patient took her BP pills this morning. Is taking amlodipine  10 mg and Cozaar  50 mg. Reports good compliance with meds. Does  not check at home. The patient is limiting salt in diet. Denies SOB, CP, leg swelling, HA, dizziness. She is drinking plenty of water.   CKD3b: Patient continues to make urine. Is avoiding NSAIDs excpet her daily ASA. No leg swelling. Taking Farxiga 10 mg. Followed by nephrology. [facility] 04/2024: Pt had labs checked. Stated if proteinuria doesn't improve may increase the losartan  to 75 mg daily if BP allows. CBC was largely unremarkable. GFR was 40. Protein/Cr ratio was 1070, up from 646 in 03/2024.  Anemia of chronic disease: Last two Hg stable around 12.1.  Mass of left arm: Found to be lipoma on MRI imaging. Referred to orthopedics who recommended conservative measures since patient is not having significant symptoms.If symptoms progress they may consider surgical excision in future  HM Wants Influenza Vaccine  Patient Active Problem List   Diagnosis Date Noted   Claudication of gluteal region 03/19/2023   Class 1 obesity due to excess calories with serious comorbidity and body mass index (BMI) of 33.0 to 33.9 in adult 06/01/2021   Stage 3b chronic kidney disease (HCC) 11/30/2020   Raynaud's phenomenon without gangrene 09/29/2020   Anemia in stage 3a chronic kidney disease (HCC) 09/29/2020   Anemia, chronic disease 02/07/2020   COVID-19 virus infection 09/28/2019   Type II diabetes mellitus with renal manifestations (HCC) 09/28/2019   HTN (hypertension) 09/28/2019   HLD (hyperlipidemia) 09/28/2019   Insomnia due to medical condition 09/13/2016   Perimenopausal 09/13/2016   Hyperlipidemia associated with  type 2 diabetes mellitus (HCC) 07/22/2014   Esophageal reflux 07/22/2014   Diabetes (HCC) 04/22/2014   Essential hypertension, benign 04/22/2014     Current Outpatient Medications on File Prior to Visit  Medication Sig Dispense Refill   losartan  (COZAAR ) 50 MG tablet TAKE 1 TABLET BY MOUTH DAILY 100 tablet 2   Accu-Chek Softclix Lancets lancets Use as instructed 100 each  12   amLODipine  (NORVASC ) 10 MG tablet Take 1 tablet (10 mg total) by mouth daily. 100 tablet 2   ASPIRIN  LOW DOSE 81 MG EC tablet TAKE 1 TABLET (81 MG TOTAL) BY MOUTH DAILY. 90 tablet 6   atorvastatin  (LIPITOR) 40 MG tablet TAKE 1 TABLET BY MOUTH DAILY 100 tablet 2   Blood Glucose Monitoring Suppl (ACCU-CHEK GUIDE) w/Device KIT UAD 1 kit 0   FARXIGA 10 MG TABS tablet Take 10 mg by mouth daily.     glimepiride  (AMARYL ) 2 MG tablet TAKE 1 AND 1/2 TABLETS BY MOUTH  DAILY BEFORE BREAKFAST 150 tablet 2   glucose blood (ACCU-CHEK GUIDE) test strip Use as instructed 100 each 12   lisinopril  (ZESTRIL ) 10 MG tablet Take 10 mg by mouth daily. (Patient not taking: Reported on 09/15/2024)     metFORMIN  (GLUCOPHAGE ) 500 MG tablet Take 1 tablet (500 mg total) by mouth daily with breakfast. 90 tablet 1   Multiple Vitamin (MULTIVITAMIN WITH MINERALS) TABS tablet Take 1 tablet by mouth daily. 100 tablet 0   Omega-3 Fatty Acids (FISH OIL ) 1000 MG CAPS Take 1 capsule (1,000 mg total) by mouth daily. 100 capsule 1   tetrahydrozoline 0.05 % ophthalmic solution Place 2 drops into both eyes daily as needed (for dry eyes).     venlafaxine  XR (EFFEXOR -XR) 75 MG 24 hr capsule Take 1 capsule (75 mg total) by mouth daily with breakfast. 90 capsule 1   Current Facility-Administered Medications on File Prior to Visit  Medication Dose Route Frequency Provider Last Rate Last Admin   0.9 %  sodium chloride  infusion  500 mL Intravenous Once McGreal, Inocente HERO, MD        No Known Allergies  Social History   Socioeconomic History   Marital status: Single    Spouse name: Not on file   Number of children: Not on file   Years of education: Not on file   Highest education level: Not on file  Occupational History   Not on file  Tobacco Use   Smoking status: Never   Smokeless tobacco: Never  Substance and Sexual Activity   Alcohol use: No   Drug use: No   Sexual activity: Not on file  Other Topics Concern   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    Social Drivers of Corporate investment banker Strain: Low Risk  (04/06/2024)   Overall Financial Resource Strain (CARDIA)    Difficulty of Paying Living Expenses: Not very hard  Food Insecurity: No Food Insecurity (04/06/2024)   Hunger Vital Sign    Worried About Running Out of Food in the Last Year: Never true    Ran Out of Food in the Last Year: Never true  Transportation Needs: No Transportation Needs (04/06/2024)   PRAPARE - Administrator, Civil Service (Medical): No    Lack of Transportation (Non-Medical): No  Physical Activity: Inactive (04/06/2024)   Exercise Vital Sign    Days of Exercise per Week: 0 days  Minutes of Exercise per Session: 0 min  Stress: No Stress Concern Present (04/06/2024)   [employer] of Occupational Health - Occupational Stress Questionnaire    Feeling of Stress : Not at all  Social Connections: Moderately Integrated (04/06/2024)   Social Connection and Isolation Panel    Frequency of Communication with Friends and Family: Twice a week    Frequency of Social Gatherings with Friends and Family: More than three times a week    Attends Religious Services: More than 4 times per year    Active Member of [employer] or Organizations: Yes    Attends Banker Meetings: More than 4 times per year    Marital Status: Never married  Intimate Partner Violence: Not At Risk (04/06/2024)   Humiliation, Afraid, Rape, and Kick questionnaire    Fear of Current or Ex-Partner: No    Emotionally Abused: No    Physically Abused: No    Sexually Abused: No    Family History  Problem Relation Age of Onset   Hypertension Mother    Hypertension Father    Heart disease Father    Hypertension Sister    Hypertension Brother    Stroke Other    Diabetes Other    Arthritis Other    Colon cancer Neg Hx    Rectal cancer Neg Hx    Stomach cancer Neg Hx     Esophageal cancer Neg Hx     Past Surgical History:  Procedure Laterality Date   CHOLECYSTECTOMY      ROS: Review of Systems Negative except as stated above  PHYSICAL EXAM: LMP 12/25/2015   Physical Exam  General appearance - alert, well appearing, older obese AAF in no distress Mental status - alert, oriented to person, place, and time Chest - clear to auscultation, no wheezes, rales or rhonchi, symmetric air entry Heart - normal rate, regular rhythm, normal S1, S2, no murmurs, rubs, clicks or gallops Extremities - UE: b/l hands without erythema or cyanosis. Capillary refill < 2 seconds. Radial and brachial pulses 3+ bilaterally. LE: Femoral pulses, popliteal pulses, posterior tibialis and dorsalis pedis pulses 3+ b/l. Scant hair on calves, pt reports she ocassionally shaves her legs. B/l LE are warm and well perfused.     Diabetic foot exam was performed with the following findings:   No deformities, ulcerations, or other skin breakdown Normal sensation of 10g monofilament Intact posterior tibialis and dorsalis pedis pulses          Latest Ref Rng & Units 11/26/2023   11:14 AM 03/19/2023   10:09 AM 11/22/2022    9:54 AM  CMP  Glucose 70 - 99 mg/dL 86  882  899   BUN 8 - 27 mg/dL 21  19  20    Creatinine 0.57 - 1.00 mg/dL 8.38  8.59  8.60   Sodium 134 - 144 mmol/L 143  141  141   Potassium 3.5 - 5.2 mmol/L 4.5  4.2  4.4   Chloride 96 - 106 mmol/L 105  103  102   CO2 20 - 29 mmol/L 21  21  23    Calcium  8.7 - 10.3 mg/dL 9.7  9.3  9.4   Total Protein 6.0 - 8.5 g/dL 6.7   7.1   Total Bilirubin 0.0 - 1.2 mg/dL 0.7   0.7   Alkaline Phos 44 - 121 IU/L 89   81   AST 0 - 40 IU/L 24   20   ALT 0 - 32 IU/L 28  20    Lipid Panel     Component Value Date/Time   CHOL 183 11/26/2023 1114   TRIG 138 11/26/2023 1114   HDL 46 11/26/2023 1114   CHOLHDL 4.0 11/26/2023 1114   CHOLHDL 2.9 09/13/2016 0949   VLDL 23 09/13/2016 0949   LDLCALC 112 (H) 11/26/2023 1114    CBC     Component Value Date/Time   WBC 6.5 11/26/2023 1114   WBC 12.2 (H) 10/02/2019 0335   RBC 4.12 11/26/2023 1114   RBC 3.65 (L) 10/02/2019 0335   HGB 12.0 11/26/2023 1114   HCT 36.1 11/26/2023 1114   PLT 305 11/26/2023 1114   MCV 88 11/26/2023 1114   MCH 29.1 11/26/2023 1114   MCH 28.2 10/02/2019 0335   MCHC 33.2 11/26/2023 1114   MCHC 32.8 10/02/2019 0335   RDW 13.2 11/26/2023 1114   LYMPHSABS 2.8 11/04/2019 1001   MONOABS 1.0 10/02/2019 0335   EOSABS 0.5 (H) 11/04/2019 1001   BASOSABS 0.1 11/04/2019 1001    ASSESSMENT AND PLAN:  Assessment and Plan  1. Type 2 diabetes mellitus with obesity (Primary) Proteinuria due to T2DM At goal. 7.0. Was told to take metformin  500mg  once daily but is taking it BID. Will check kidney function to see if that dose can be maintained, or if we should decrease to once a day. Continue glimepiride  2mg  1.5 tablets daily, Farxiga 10 mg daily and for now metformin  500 mg BID. - Encouraged continued exercise - POCT glycosylated hemoglobin (Hb A1C) - POCT glucose (manual entry) - Basic Metabolic Panel - glimepiride  (AMARYL ) 2 MG tablet; 1.5 tablets  Dispense: 150 tablet; Refill: 2  2. Hyperlipidemia associated with type 2 diabetes mellitus (HCC) Continue atorvastatin  40 mg daily. - atorvastatin  (LIPITOR) 40 MG tablet; Take 1 tablet (40 mg total) by mouth daily.  Dispense: 100 tablet; Refill: 2  3. Hypertension associated with diabetes (HCC) At goal. Continue amlodipine  10 mg and Cozaar  50 mg.  - Discussed continued healthy eating and to avoid red meat and cold cuts - amLODipine  (NORVASC ) 10 MG tablet; Take 1 tablet (10 mg total) by mouth daily.  Dispense: 100 tablet; Refill: 2 - losartan  (COZAAR ) 50 MG tablet; Take 1 tablet (50 mg total) by mouth daily.  Dispense: 100 tablet; Refill: 2  4. Anemia in stage 3b chronic kidney disease (HCC) Stable.   5. CKD stage 3b, GFR 30-44 ml/min (HCC) Most recent GFR was 40. On Farxiga 10 mg daily. Last  Protein/Cr ratio in 04/2024 was elevated at 1070. Will not increase Losartan  at this time, her blood pressure is in good range and I do not want to worsen her GFR especially with her taking metformin  BID. Continue to follow with nephrology.  - Basic Metabolic Panel  6. Claudication of both lower extremities Concern for PAD, of which patient has many risk factors for. Exam reassuring at this time. Will order an ankle brachial index. - ABI  7. Lipoma of left upper extremity Conservative management per orthopedics. Will check intermittently for change in size or symptoms.  8. Need for immunization against influenza - Flu vaccine trivalent PF, 6mos and older(Flulaval,Afluria,Fluarix,Fluzone)   Patient was given the opportunity to ask questions.  Patient verbalized understanding of the plan and was able to repeat key elements of the plan.   Orders Placed This Encounter  Procedures   POCT glycosylated hemoglobin (Hb A1C)   POCT glucose (manual entry)     Requested Prescriptions    No prescriptions requested or ordered in  this encounter    No follow-ups on file.  This is a Psychologist, occupational Note.  The care of the patient was discussed with Dr. Vicci and the assessment and plan formulated with her assistance.  Please see her attestation of this encounter.  Duwaine Amber, MS3 [facility]  Evaluation and management procedures were performed by me with Medical Student in attendance, note written by Medical Student under my supervision and collaboration. I have reviewed the note and I agree with the management and plan.   Barnie Vicci, MD, FAAFP. [facility], [place] [phone]   09/15/2024, 5:46 PM

## 2024-09-16 ENCOUNTER — Ambulatory Visit: Payer: Self-pay | Admitting: Internal Medicine

## 2024-09-16 LAB — BASIC METABOLIC PANEL WITH GFR
BUN/Creatinine Ratio: 14 (ref 12–28)
BUN: 20 mg/dL (ref 8–27)
CO2: 22 mmol/L (ref 20–29)
Calcium: 9.7 mg/dL (ref 8.7–10.3)
Chloride: 101 mmol/L (ref 96–106)
Creatinine, Ser: 1.47 mg/dL — ABNORMAL HIGH (ref 0.57–1.00)
Glucose: 91 mg/dL (ref 70–99)
Potassium: 4.1 mmol/L (ref 3.5–5.2)
Sodium: 138 mmol/L (ref 134–144)
eGFR: 40 mL/min/1.73 — ABNORMAL LOW (ref >59)

## 2024-09-21 ENCOUNTER — Ambulatory Visit (HOSPITAL_COMMUNITY)
Admission: RE | Admit: 2024-09-21 | Discharge: 2024-09-21 | Disposition: A | Discharge status: Home / Self Care | Source: Ambulatory Visit | Attending: Surgery | Admitting: Surgery

## 2024-09-21 DIAGNOSIS — I739 Peripheral vascular disease, unspecified: Secondary | ICD-10-CM | POA: Diagnosis not present

## 2024-09-21 LAB — VAS US ABI WITH/WO TBI
Left ABI: 1.13
Right ABI: 1.09

## 2024-09-28 ENCOUNTER — Other Ambulatory Visit: Payer: Self-pay | Admitting: Internal Medicine

## 2024-09-28 DIAGNOSIS — E669 Obesity, unspecified: Secondary | ICD-10-CM

## 2024-09-28 MED ORDER — GLIMEPIRIDE 2 MG PO TABS: ORAL_TABLET | ORAL | 2 refills | Status: DC

## 2024-10-10 ENCOUNTER — Other Ambulatory Visit: Payer: Self-pay | Admitting: Internal Medicine

## 2024-10-10 DIAGNOSIS — N951 Menopausal and female climacteric states: Secondary | ICD-10-CM

## 2024-10-27 ENCOUNTER — Other Ambulatory Visit: Payer: Self-pay | Admitting: Internal Medicine

## 2024-10-27 DIAGNOSIS — E669 Obesity, unspecified: Secondary | ICD-10-CM

## 2025-01-15 ENCOUNTER — Ambulatory Visit: Attending: Internal Medicine | Admitting: Internal Medicine

## 2025-01-15 VITALS — BP 118/70 | HR 94 | Temp 98.3°F | Ht 61.0 in | Wt 187.0 lb

## 2025-01-15 DIAGNOSIS — D631 Anemia in chronic kidney disease: Secondary | ICD-10-CM | POA: Diagnosis not present

## 2025-01-15 DIAGNOSIS — N1832 Chronic kidney disease, stage 3b: Secondary | ICD-10-CM

## 2025-01-15 DIAGNOSIS — Z0120 Encounter for dental examination and cleaning without abnormal findings: Secondary | ICD-10-CM

## 2025-01-15 DIAGNOSIS — Z7984 Long term (current) use of oral hypoglycemic drugs: Secondary | ICD-10-CM

## 2025-01-15 DIAGNOSIS — E669 Obesity, unspecified: Secondary | ICD-10-CM

## 2025-01-15 DIAGNOSIS — E1169 Type 2 diabetes mellitus with other specified complication: Secondary | ICD-10-CM | POA: Diagnosis not present

## 2025-01-15 DIAGNOSIS — E785 Hyperlipidemia, unspecified: Secondary | ICD-10-CM

## 2025-01-15 DIAGNOSIS — M2392 Unspecified internal derangement of left knee: Secondary | ICD-10-CM | POA: Diagnosis not present

## 2025-01-15 DIAGNOSIS — E1159 Type 2 diabetes mellitus with other circulatory complications: Secondary | ICD-10-CM

## 2025-01-15 DIAGNOSIS — I10 Essential (primary) hypertension: Secondary | ICD-10-CM | POA: Diagnosis not present

## 2025-01-15 DIAGNOSIS — E119 Type 2 diabetes mellitus without complications: Secondary | ICD-10-CM

## 2025-01-15 DIAGNOSIS — I152 Hypertension secondary to endocrine disorders: Secondary | ICD-10-CM

## 2025-01-15 LAB — GLUCOSE, POCT (MANUAL RESULT ENTRY): POC Glucose: 121 mg/dL — AB (ref 70–99)

## 2025-01-15 LAB — POCT GLYCOSYLATED HEMOGLOBIN (HGB A1C): HbA1c, POC (controlled diabetic range): 7.6 % — AB (ref 0.0–7.0)

## 2025-01-15 MED ORDER — GLIMEPIRIDE 2 MG PO TABS: ORAL_TABLET | ORAL | 2 refills | Status: AC

## 2025-01-15 NOTE — Progress Notes (Signed)
 "   Patient ID: [name] Barker, female    DOB: [birth year]  MRN: [id number]  CC: Diabetes (DM f/u./Intermittent LT knee pain X6 mo/Already received flu vax)   Subjective: [name] Barker is a 64 y.o. female who presents for chronic ds management. Her chronic medical issues include:  Patient with history of DM (with macroalbumin, peripheral neuropathy) HL, HTN, perimenopausal (On Effexor ), anemia chronic ds, insomnia, CKD stage 3, BL complicated renal cyst,  ulnar neuritis   Discussed the use of AI scribe software for clinical note transcription with the patient, who gave verbal consent to proceed.  History of Present Illness [name] Barker is a 64 year old female with diabetes, hypertension, hyperlipidemia, and chronic kidney disease who presents for a four-month follow-up.  DM: Results for orders placed or performed in visit on 01/15/25  POCT glucose (manual entry)   Collection Time: 01/15/25 10:50 AM  Result Value Ref Range   POC Glucose 121 (A) 70 - 99 mg/dl  POCT glycosylated hemoglobin (Hb A1C)   Collection Time: 01/15/25 10:54 AM  Result Value Ref Range   Hemoglobin A1C     HbA1c POC (<> result, manual entry)     HbA1c, POC (prediabetic range)     HbA1c, POC (controlled diabetic range) 7.6 (A) 0.0 - 7.0 %  Her hemoglobin A1c has increased from 7.0 to 7.6 since the last visit, with current blood glucose levels around 121 mg/dL. She is on metformin  500 mg twice daily, glimepiride  2 mg daily (1.5 tablets), and Farxiga 10 mg daily. She monitors her blood glucose once daily, typically in the afternoon, with readings between 150 and 190 mg/dL. She has gained 6 pounds over the past year, now weighing 187 pounds. Her diet includes vegetables, diet soda, and occasional sweets. She engages in daily 30-minute walks but has not attended the gym recently.  HTN: Her blood pressure today is 118/70 mmHg, with home readings occasionally exceeding the target of 130/80 mmHg. She takes losartan  50 mg  and amlodipine  10 mg daily. She limits salt intake but notes that some foods are inherently salty.  HL: She is on atorvastatin  40 mg daily for hyperlipidemia, with her last LDL cholesterol reading at 112 mg/dL in December 7975. She is due for a cholesterol check today.  Regarding her chronic kidney disease, she last consulted her nephrologist in November 2025. She has a history of stage 3b chronic kidney disease and anemia associated with her kidney disease, which was stable over a year ago. Blood tests are scheduled today to reassess her kidney function and anemia status. GFR in Sept was 40.  She experiences issues with her left knee, describing a sensation of 'grabbing' or instability, particularly when ascending stairs or during regular walking. There is no swelling or pain, but she perceives a 'catch' in the knee. She has not experienced any recent falls.  She has form with her from her local dentist known as [facility].  Needing clearance to have some fillings done.    Patient Active Problem List   Diagnosis Date Noted   Claudication of gluteal region 03/19/2023   Class 1 obesity due to excess calories with serious comorbidity and body mass index (BMI) of 33.0 to 33.9 in adult 06/01/2021   Stage 3b chronic kidney disease (HCC) 11/30/2020   Raynaud's phenomenon without gangrene 09/29/2020   Anemia in stage 3a chronic kidney disease (HCC) 09/29/2020   Anemia, chronic disease 02/07/2020   COVID-19 virus infection 09/28/2019   Type II diabetes mellitus with  renal manifestations (HCC) 09/28/2019   HTN (hypertension) 09/28/2019   HLD (hyperlipidemia) 09/28/2019   Insomnia due to medical condition 09/13/2016   Perimenopausal 09/13/2016   Hyperlipidemia associated with type 2 diabetes mellitus (HCC) 07/22/2014   Esophageal reflux 07/22/2014   Diabetes (HCC) 04/22/2014   Essential hypertension, benign 04/22/2014     Medications Ordered Prior to  Encounter[1]  Allergies[2]  Social History   Socioeconomic History   Marital status: Single    Spouse name: Not on file   Number of children: Not on file   Years of education: Not on file   Highest education level: Not on file  Occupational History   Not on file  Tobacco Use   Smoking status: Never   Smokeless tobacco: Never  Substance and Sexual Activity   Alcohol use: No   Drug use: No   Sexual activity: Not on file  Other Topics Concern   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    Social Drivers of Health   Tobacco Use: Low Risk (09/15/2024)   Patient History    Smoking Tobacco Use: Never    Smokeless Tobacco Use: Never    Passive Exposure: Not on file  Financial Resource Strain: Low Risk (04/06/2024)   Overall Financial Resource Strain (CARDIA)    Difficulty of Paying Living Expenses: Not very hard  Food Insecurity: No Food Insecurity (04/06/2024)   Hunger Vital Sign    Worried About Running Out of Food in the Last Year: Never true    Ran Out of Food in the Last Year: Never true  Transportation Needs: No Transportation Needs (04/06/2024)   PRAPARE - Administrator, Civil Service (Medical): No    Lack of Transportation (Non-Medical): No  Physical Activity: Inactive (04/06/2024)   Exercise Vital Sign    Days of Exercise per Week: 0 days    Minutes of Exercise per Session: 0 min  Stress: No Stress Concern Present (04/06/2024)   [employer] of Occupational Health - Occupational Stress Questionnaire    Feeling of Stress : Not at all  Social Connections: Moderately Integrated (04/06/2024)   Social Connection and Isolation Panel    Frequency of Communication with Friends and Family: Twice a week    Frequency of Social Gatherings with Friends and Family: More than three times a week    Attends Religious Services: More than 4 times per year    Active Member of [employer] or Organizations: Yes    Attends  Banker Meetings: More than 4 times per year    Marital Status: Never married  Intimate Partner Violence: Not At Risk (04/06/2024)   Humiliation, Afraid, Rape, and Kick questionnaire    Fear of Current or Ex-Partner: No    Emotionally Abused: No    Physically Abused: No    Sexually Abused: No  Depression (PHQ2-9): Low Risk (09/15/2024)   Depression (PHQ2-9)    PHQ-2 Score: 1  Alcohol Screen: Low Risk (04/06/2024)   Alcohol Screen    Last Alcohol Screening Score (AUDIT): 0  Housing: Low Risk (04/06/2024)   Housing Stability Vital Sign    Unable to Pay for Housing in the Last Year: No    Number of Times Moved in the Last Year: 0    Homeless in the Last Year: No  Utilities: Not At Risk (04/06/2024)   AHC Utilities    Threatened with loss of utilities: No  Health  Literacy: Adequate Health Literacy (11/26/2023)   B1300 Health Literacy    Frequency of need for help with medical instructions: Rarely    Family History  Problem Relation Age of Onset   Hypertension Mother    Hypertension Father    Heart disease Father    Hypertension Sister    Hypertension Brother    Stroke Other    Diabetes Other    Arthritis Other    Colon cancer Neg Hx    Rectal cancer Neg Hx    Stomach cancer Neg Hx    Esophageal cancer Neg Hx     Past Surgical History:  Procedure Laterality Date   CHOLECYSTECTOMY      ROS: Review of Systems Negative except as stated above  PHYSICAL EXAM: BP 118/70 (BP Location: Left Arm, Patient Position: Sitting, Cuff Size: Normal)   Pulse 94   Temp 98.3 F (36.8 C) (Oral)   Ht 5' 1 (1.549 m)   Wt 187 lb (84.8 kg)   LMP 12/25/2015   SpO2 96%   BMI 35.33 kg/m   Wt Readings from Last 3 Encounters:  01/15/25 187 lb (84.8 kg)  09/15/24 185 lb (83.9 kg)  06/23/24 180 lb (81.6 kg)    Physical Exam  General appearance - alert, well appearing, older AAF and in no distress Mental status - normal mood, behavior, speech, dress, motor activity, and  thought processes Neck - supple, no significant adenopathy Chest - clear to auscultation, no wheezes, rales or rhonchi, symmetric air entry Heart - normal rate, regular rhythm, normal S1, S2, no murmurs, rubs, clicks or gallops Musculoskeletal - no joint tenderness, deformity or swelling Extremities - peripheral pulses normal, no pedal edema, no clubbing or cyanosis      Latest Ref Rng & Units 09/15/2024   11:30 AM 11/26/2023   11:14 AM 03/19/2023   10:09 AM  CMP  Glucose 70 - 99 mg/dL 91  86  882   BUN 8 - 27 mg/dL 20  21  19    Creatinine 0.57 - 1.00 mg/dL 8.52  8.38  8.59   Sodium 134 - 144 mmol/L 138  143  141   Potassium 3.5 - 5.2 mmol/L 4.1  4.5  4.2   Chloride 96 - 106 mmol/L 101  105  103   CO2 20 - 29 mmol/L 22  21  21    Calcium  8.7 - 10.3 mg/dL 9.7  9.7  9.3   Total Protein 6.0 - 8.5 g/dL  6.7    Total Bilirubin 0.0 - 1.2 mg/dL  0.7    Alkaline Phos 44 - 121 IU/L  89    AST 0 - 40 IU/L  24    ALT 0 - 32 IU/L  28     Lipid Panel     Component Value Date/Time   CHOL 183 11/26/2023 1114   TRIG 138 11/26/2023 1114   HDL 46 11/26/2023 1114   CHOLHDL 4.0 11/26/2023 1114   CHOLHDL 2.9 09/13/2016 0949   VLDL 23 09/13/2016 0949   LDLCALC 112 (H) 11/26/2023 1114    CBC    Component Value Date/Time   WBC 6.5 11/26/2023 1114   WBC 12.2 (H) 10/02/2019 0335   RBC 4.12 11/26/2023 1114   RBC 3.65 (L) 10/02/2019 0335   HGB 12.0 11/26/2023 1114   HCT 36.1 11/26/2023 1114   PLT 305 11/26/2023 1114   MCV 88 11/26/2023 1114   MCH 29.1 11/26/2023 1114   MCH 28.2 10/02/2019 0335   MCHC  33.2 11/26/2023 1114   MCHC 32.8 10/02/2019 0335   RDW 13.2 11/26/2023 1114   LYMPHSABS 2.8 11/04/2019 1001   MONOABS 1.0 10/02/2019 0335   EOSABS 0.5 (H) 11/04/2019 1001   BASOSABS 0.1 11/04/2019 1001    ASSESSMENT AND PLAN: 1. Type 2 diabetes mellitus in patient with obesity (HCC) (Primary) Not at goal. Discussed and encouraged healthy eating habits.  Continue regular  exercise. Continue metformin  500 mg twice a day.  Will keep an eye on this given her kidney function. Increase Amaryl  to 4 mg in the morning 1 mg in the afternoon. Continue Farxiga. - POCT glucose (manual entry) - POCT glycosylated hemoglobin (Hb A1C) - glimepiride  (AMARYL ) 2 MG tablet; 2 tabs PO in a.m and 1/2 tab PO in evenings.  Dispense: 225 tablet; Refill: 2 - Comprehensive metabolic panel with GFR - CBC  2. Diabetes mellitus treated with oral medication (HCC) See #1 above.  3. Hypertension associated with diabetes (HCC) At goal.  Continue amlodipine  10 mg daily and Cozaar  50 mg daily.  4. Hyperlipidemia associated with type 2 diabetes mellitus (HCC) Continue atorvastatin  40 mg daily.  Due for recheck. - Lipid panel  5. Anemia in stage 3b chronic kidney disease (HCC) Last CBC done a little over a year ago showed hemoglobin around 12..  6. CKD stage 3b, GFR 30-44 ml/min (HCC) Stable with GFR in the low 40s.  Continue Farxiga 10 mg daily.  Not on NSAIDs.  Continue monitoring and follow-up with nephrology..  7. Derangement of left knee Will refer to orthopedics for further evaluation and management - AMB referral to orthopedics  8. Need for dental care I completed the form to allow her to move forward with having her fillings done.  Advised patient that she can hold the aspirin  for 5 days prior.   Patient was given the opportunity to ask questions.  Patient verbalized understanding of the plan and was able to repeat key elements of the plan.   This documentation was completed using Paediatric nurse.  Any transcriptional errors are unintentional.  Orders Placed This Encounter  Procedures   POCT glucose (manual entry)   POCT glycosylated hemoglobin (Hb A1C)     Requested Prescriptions    No prescriptions requested or ordered in this encounter    No follow-ups on file.  Barnie Louder, MD, FACP     [1]  Current Outpatient Medications on File  Prior to Visit  Medication Sig Dispense Refill   Accu-Chek Softclix Lancets lancets Use as instructed 100 each 12   amLODipine  (NORVASC ) 10 MG tablet Take 1 tablet (10 mg total) by mouth daily. 100 tablet 2   ASPIRIN  LOW DOSE 81 MG EC tablet TAKE 1 TABLET (81 MG TOTAL) BY MOUTH DAILY. 90 tablet 6   atorvastatin  (LIPITOR) 40 MG tablet Take 1 tablet (40 mg total) by mouth daily. 100 tablet 2   Blood Glucose Monitoring Suppl (ACCU-CHEK GUIDE) w/Device KIT UAD 1 kit 0   FARXIGA 10 MG TABS tablet Take 10 mg by mouth daily.     glimepiride  (AMARYL ) 2 MG tablet 1.5 tablets (3 mg) PO daily 150 tablet 2   glucose blood (ACCU-CHEK GUIDE) test strip Use as instructed 100 each 12   losartan  (COZAAR ) 50 MG tablet Take 1 tablet (50 mg total) by mouth daily. 100 tablet 2   metFORMIN  (GLUCOPHAGE ) 500 MG tablet TAKE 1 TABLET BY MOUTH DAILY  WITH BREAKFAST 90 tablet 3   Multiple Vitamin (MULTIVITAMIN WITH MINERALS) TABS tablet  Take 1 tablet by mouth daily. 100 tablet 0   Omega-3 Fatty Acids (FISH OIL ) 1000 MG CAPS Take 1 capsule (1,000 mg total) by mouth daily. 100 capsule 1   tetrahydrozoline 0.05 % ophthalmic solution Place 2 drops into both eyes daily as needed (for dry eyes).     venlafaxine  XR (EFFEXOR -XR) 75 MG 24 hr capsule TAKE 1 CAPSULE BY MOUTH DAILY  WITH BREAKFAST 100 capsule 1   Current Facility-Administered Medications on File Prior to Visit  Medication Dose Route Frequency Provider Last Rate Last Admin   0.9 %  sodium chloride  infusion  500 mL Intravenous Once McGreal, Inocente HERO, MD      [2] No Known Allergies  "

## 2025-01-15 NOTE — Patient Instructions (Signed)
" °  VISIT SUMMARY: During your visit today, we reviewed your diabetes, hypertension, hyperlipidemia, and chronic kidney disease. We discussed your recent blood glucose levels, weight gain, and dietary habits. We also addressed your blood pressure management, cholesterol levels, and kidney function. Additionally, we talked about the issues you are experiencing with your left knee.  YOUR PLAN: -TYPE 2 DIABETES MELLITUS: Your hemoglobin A1c has increased to 7.6, indicating that your blood sugar levels are not as well controlled as we would like. We have increased your glimepiride  dosage to 4 mg in the morning and 2 mg in the evening. Please reduce sweet snacks and try to include healthier options like fruits, unsalted nuts, and low-fat yogurt in your diet. Continue your daily 30-minute walks.  -CHRONIC KIDNEY DISEASE STAGE 3B WITH ANEMIA: Your chronic kidney disease is stable, but we need to reassess your kidney function and anemia status. We have ordered blood tests, including cholesterol, kidney and liver function tests, and a blood cell count. Continue taking losartan  50 mg daily and amlodipine  10 mg daily, and maintain a low-salt diet.  -DERANGEMENT OF LEFT KNEE: You are experiencing intermittent catching and instability in your left knee, which may be due to arthritis or a mechanical issue. We have referred you to orthopedics for further evaluation.  INSTRUCTIONS: Please follow up with the blood tests we have ordered and schedule an appointment with the orthopedist for your knee evaluation. Continue monitoring your blood glucose levels and blood pressure at home, and maintain your current medications and lifestyle changes.    Contains text generated by [employer].   "

## 2025-01-16 ENCOUNTER — Ambulatory Visit: Payer: Self-pay | Admitting: Internal Medicine

## 2025-01-16 ENCOUNTER — Encounter: Payer: Self-pay | Admitting: Internal Medicine

## 2025-01-16 LAB — COMPREHENSIVE METABOLIC PANEL WITH GFR
ALT: 27 [IU]/L (ref 0–32)
AST: 23 [IU]/L (ref 0–40)
Albumin: 4.4 g/dL (ref 3.9–4.9)
Alkaline Phosphatase: 107 [IU]/L (ref 49–135)
BUN/Creatinine Ratio: 12 (ref 12–28)
BUN: 19 mg/dL (ref 8–27)
Bilirubin Total: 0.6 mg/dL (ref 0.0–1.2)
CO2: 21 mmol/L (ref 20–29)
Calcium: 9.7 mg/dL (ref 8.7–10.3)
Chloride: 107 mmol/L — ABNORMAL HIGH (ref 96–106)
Creatinine, Ser: 1.57 mg/dL — ABNORMAL HIGH (ref 0.57–1.00)
Globulin, Total: 2.8 g/dL (ref 1.5–4.5)
Glucose: 88 mg/dL (ref 70–99)
Potassium: 4.4 mmol/L (ref 3.5–5.2)
Sodium: 142 mmol/L (ref 134–144)
Total Protein: 7.2 g/dL (ref 6.0–8.5)
eGFR: 37 mL/min/{1.73_m2} — ABNORMAL LOW

## 2025-01-16 LAB — CBC
Hematocrit: 39.1 % (ref 34.0–46.6)
Hemoglobin: 12.5 g/dL (ref 11.1–15.9)
MCH: 28.2 pg (ref 26.6–33.0)
MCHC: 32 g/dL (ref 31.5–35.7)
MCV: 88 fL (ref 79–97)
Platelets: 287 10*3/uL (ref 150–450)
RBC: 4.44 x10E6/uL (ref 3.77–5.28)
RDW: 13.5 % (ref 11.7–15.4)
WBC: 6.9 10*3/uL (ref 3.4–10.8)

## 2025-01-16 LAB — LIPID PANEL
Chol/HDL Ratio: 3.5 ratio (ref 0.0–4.4)
Cholesterol, Total: 175 mg/dL (ref 100–199)
HDL: 50 mg/dL
LDL Chol Calc (NIH): 95 mg/dL (ref 0–99)
Triglycerides: 177 mg/dL — ABNORMAL HIGH (ref 0–149)
VLDL Cholesterol Cal: 30 mg/dL (ref 5–40)

## 2025-01-29 ENCOUNTER — Ambulatory Visit: Admitting: Surgical

## 2025-01-29 ENCOUNTER — Other Ambulatory Visit: Payer: Self-pay

## 2025-01-29 DIAGNOSIS — G8929 Other chronic pain: Secondary | ICD-10-CM

## 2025-02-09 ENCOUNTER — Ambulatory Visit: Admitting: Physical Therapy

## 2025-04-20 ENCOUNTER — Ambulatory Visit

## 2025-05-18 ENCOUNTER — Ambulatory Visit: Payer: Self-pay | Admitting: Internal Medicine
